# Patient Record
Sex: Female | Born: 1935
Health system: Southern US, Community
[De-identification: ages and names within clinical notes are randomized; demographics above are authoritative.]

## PROBLEM LIST (undated history)

## (undated) DIAGNOSIS — I4891 Unspecified atrial fibrillation: Secondary | ICD-10-CM

## (undated) DIAGNOSIS — I499 Cardiac arrhythmia, unspecified: Secondary | ICD-10-CM

## (undated) DIAGNOSIS — I10 Essential (primary) hypertension: Secondary | ICD-10-CM

## (undated) DIAGNOSIS — F419 Anxiety disorder, unspecified: Secondary | ICD-10-CM

## (undated) DIAGNOSIS — C50919 Malignant neoplasm of unspecified site of unspecified female breast: Secondary | ICD-10-CM

## (undated) HISTORY — DX: Cardiac arrhythmia, unspecified: I49.9

## (undated) HISTORY — DX: Unspecified atrial fibrillation: I48.91

## (undated) HISTORY — DX: Anxiety disorder, unspecified: F41.9

## (undated) HISTORY — DX: Malignant neoplasm of unspecified site of unspecified female breast: C50.919

## (undated) HISTORY — PX: PARTIAL HYSTERECTOMY: SHX80

## (undated) HISTORY — PX: LIVER SURGERY: SHX698

## (undated) HISTORY — DX: Essential (primary) hypertension: I10

## (undated) HISTORY — PX: HEMORRHOID SURGERY: SHX153

---

## 2010-06-26 DIAGNOSIS — H40023 Open angle with borderline findings, high risk, bilateral: Secondary | ICD-10-CM | POA: Insufficient documentation

## 2010-06-26 DIAGNOSIS — H2510 Age-related nuclear cataract, unspecified eye: Secondary | ICD-10-CM | POA: Insufficient documentation

## 2010-06-26 DIAGNOSIS — H539 Unspecified visual disturbance: Secondary | ICD-10-CM | POA: Insufficient documentation

## 2019-02-18 ENCOUNTER — Other Ambulatory Visit: Payer: Self-pay

## 2019-02-18 ENCOUNTER — Encounter: Payer: Self-pay | Admitting: Cardiovascular Disease

## 2019-02-18 ENCOUNTER — Ambulatory Visit (INDEPENDENT_AMBULATORY_CARE_PROVIDER_SITE_OTHER): Payer: Medicare Other | Admitting: Cardiovascular Disease

## 2019-02-18 VITALS — BP 136/91 | HR 79 | Ht 60.0 in | Wt 154.0 lb

## 2019-02-18 DIAGNOSIS — I10 Essential (primary) hypertension: Secondary | ICD-10-CM

## 2019-02-18 DIAGNOSIS — R600 Localized edema: Secondary | ICD-10-CM | POA: Diagnosis not present

## 2019-02-18 DIAGNOSIS — I4821 Permanent atrial fibrillation: Secondary | ICD-10-CM

## 2019-02-18 MED ORDER — TIADYLT ER 240 MG PO CP24: 240.0000 mg | ORAL_CAPSULE | Freq: Every day | ORAL | 3 refills | Status: DC

## 2019-02-18 MED ORDER — APIXABAN 5 MG PO TABS: 5.0000 mg | ORAL_TABLET | Freq: Two times a day (BID) | ORAL | 3 refills | Status: DC

## 2019-02-18 MED ORDER — LOSARTAN POTASSIUM-HCTZ 100-12.5 MG PO TABS: 1.0000 | ORAL_TABLET | Freq: Every day | ORAL | 3 refills | Status: DC

## 2019-02-18 NOTE — Patient Instructions (Signed)
Medication Instructions:  Continue same medications   Lab work: None ordered  Testing/Procedures: Schedule Echo  Follow-Up: At [facility], you and your health needs are our priority.  As part of our continuing mission to provide you with exceptional heart care, we have created designated Provider Care Teams.  These Care Teams include your primary Cardiologist (physician) and Advanced Practice Providers (APPs -  Physician Assistants and Nurse Practitioners) who all work together to provide you with the care you need, when you need it. . Schedule follow up appointment in 4 months

## 2019-02-18 NOTE — Progress Notes (Signed)
Vit  

## 2019-02-18 NOTE — Progress Notes (Signed)
Cardiology Office Note:   Date:  02/18/2019  NAME:  Whitney Burton    MRN: [id number] DOB:  1933-05-18   PCP:  Donald Prose, MD  Cardiologist:  No primary care provider on file.  Electrophysiologist:  None   Referring MD: Donald Prose, MD   Chief Complaint  Patient presents with   Atrial Fibrillation    History of Present Illness:   Whitney Burton is a 85 y.o. female with a hx of hypertension, atrial fibrillation who is being seen today for the evaluation of atrial fibrillation at the request of Donald Prose, MD.  She presents for the evaluation of atrial fibrillation.  She was diagnosed with this nearly 1-1/2 years ago and [facility].  She reports that she underwent a cardioversion that was unsuccessful about 1 year ago.  She states that she did not want a pursue another cardioversion.  She was offered appears to be Tikosyn but she did not want to do this because she did not want is in the hospital for 3 days.  She reports has been fatigued for nearly 1-1/2 years.  This has coincided with the death of her husband.  She is recently relocated to [facility] to be closer to her daughter who lives here.  She states she is had no issues with Eliquis.  Apparently her cardiologist there wanted to pursue a watchman, but she was not interested.  She has no troubles with falls or issues with bleeding that I can tell.  She reports she is unaware that she is in atrial fibrillation, and is doing the best she can.  She does report intermittent chest tightness.  Overall she is getting used to living in [place], and quite occupied with her recent move.  She does report significant stress with a neighbor who lives above her.  Apparently this never causes quite a bit of noise.  Other than that she denies any cardiovascular symptoms.  She reports occasional lower extremity edema swelling.  That improves with raising her legs at night.  Overall, she is in pretty good health.  EKG just demonstrates atrial  fibrillation.  Past Medical History: Past Medical History:  Diagnosis Date   Arrhythmia    Hypertension     Past Surgical History: Past Surgical History:  Procedure Laterality Date   HEMORRHOID SURGERY     PARTIAL HYSTERECTOMY      Current Medications: Current Meds  Medication Sig   apixaban (ELIQUIS) 5 MG TABS tablet Take 5 mg by mouth 2 (two) times daily.   losartan-hydrochlorothiazide (HYZAAR) 100-12.5 MG tablet Take 1 tablet by mouth daily.   TIADYLT ER 240 MG 24 hr capsule Take 240 mg by mouth daily.     Allergies:    Patient has no allergy information on record.   Social History: Social History   Socioeconomic History   Marital status: Widowed    Spouse name: Not on file   Number of children: Not on file   Years of education: Not on file   Highest education level: Not on file  Occupational History   Not on file  Social Needs   Financial resource strain: Not on file   Food insecurity    Worry: Not on file    Inability: Not on file   Transportation needs    Medical: Not on file    Non-medical: Not on file  Tobacco Use   Smoking status: Former Smoker   Smokeless tobacco: Never Used  Substance and Sexual Activity  Alcohol use: Yes   Drug use: Not on file   Sexual activity: Not on file  Lifestyle   Physical activity    Days per week: Not on file    Minutes per session: Not on file   Stress: Not on file  Relationships   Social connections    Talks on phone: Not on file    Gets together: Not on file    Attends religious service: Not on file    Active member of club or organization: Not on file    Attends meetings of clubs or organizations: Not on file    Relationship status: Not on file  Other Topics Concern   Not on file  Social History Narrative   Widowed. Moved to [place] to be closer to daughter.      Family History: The patient's family history includes Heart attack in her father.  ROS:   All other ROS  reviewed and negative. Pertinent positives noted in the HPI.     EKGs/Labs/Other Studies Reviewed:   The following studies were personally reviewed by me today:  EKG:  EKG is ordered today.  The ekg ordered today demonstrates atrial fibrillation, heart rate 79, normal intervals, PVC noted, poor R wave progression likely due to incorrect precordial placement, and was personally reviewed by me.   Recent Labs: No results found for requested labs within last 8760 hours.   Recent Lipid Panel No results found for: CHOL, TRIG, HDL, CHOLHDL, VLDL, LDLCALC, LDLDIRECT  Physical Exam:   VS:  BP (!) 136/91 (BP Location: Left Arm)    Pulse 79    Ht 5' (1.524 m)    Wt 154 lb (69.9 kg)    BMI 30.08 kg/m    Wt Readings from Last 3 Encounters:  02/18/19 154 lb (69.9 kg)    General: Well nourished, well developed, in no acute distress Heart: Atraumatic, normal size  Eyes: PEERLA, EOMI  Neck: Supple, no JVD Endocrine: No thryomegaly Cardiac: Normal S1, S2; irregular rhythm; no murmurs, rubs, or gallops Lungs: Clear to auscultation bilaterally, no wheezing, rhonchi or rales  Abd: Soft, nontender, no hepatomegaly  Ext: Trace edema, evidence of chronic venous insufficiency Musculoskeletal: No deformities, BUE and BLE strength normal and equal Skin: Warm and dry, no rashes   Neuro: Alert and oriented to person, place, time, and situation, CNII-XII grossly intact, no focal deficits  Psych: Normal mood and affect   ASSESSMENT:   NAME@ is a 85 y.o. female who presents for the following: 1. Permanent atrial fibrillation   2. Essential hypertension   3. Leg edema     PLAN:   1. Permanent atrial fibrillation -She is had unsuccessful cardioversion x1 in the past.  She was not interested in medication or rhythm control therapy at that time.  I think we can get an echocardiogram here.  She reports she would like to possibly discuss rhythm control options for her but would like to get settled into  [facility] for a pursue that. -For now we will discontinue her diltiazem 240 mg extended release daily.  I instructed her to stop taking metoprolol, I do not think she needs a beta-blocker and a calcium channel blocker the same time. -No issues with bleeding although we refilled her diltiazem and Eliquis 5 mg twice daily  2. Essential hypertension -A bit up today but reports difficulties in getting to her appointment, we will not make any changes other than the diltiazem as above  3. Leg edema -Likely just related  to the venous insufficiency, we will check an echocardiogram however she will pursue conservative measures as elevating her legs at night   Disposition: Return in about 4 months (around 06/20/2019).  Medication Adjustments/Labs and Tests Ordered: Current medicines are reviewed at length with the patient today.  Concerns regarding medicines are outlined above.  Orders Placed This Encounter  Procedures   EKG 12-Lead   ECHOCARDIOGRAM COMPLETE   No orders of the defined types were placed in this encounter.   Patient Instructions  Medication Instructions:  Continue same medications   Lab work: None ordered  Testing/Procedures: Schedule Echo  Follow-Up: At [facility], you and your health needs are our priority.  As part of our continuing mission to provide you with exceptional heart care, we have created designated Provider Care Teams.  These Care Teams include your primary Cardiologist (physician) and Advanced Practice Providers (APPs -  Physician Assistants and Nurse Practitioners) who all work together to provide you with the care you need, when you need it.  Schedule follow up appointment in 4 months      Signed, [place] T. Audie Box, [place]  [address], [place], [place] [address] [phone]  02/18/2019 11:34 AM

## 2019-02-28 ENCOUNTER — Other Ambulatory Visit: Payer: Self-pay

## 2019-02-28 ENCOUNTER — Ambulatory Visit (HOSPITAL_COMMUNITY): Payer: Medicare Other | Attending: Cardiology

## 2019-02-28 DIAGNOSIS — I10 Essential (primary) hypertension: Secondary | ICD-10-CM

## 2019-02-28 DIAGNOSIS — I4821 Permanent atrial fibrillation: Secondary | ICD-10-CM

## 2019-03-04 ENCOUNTER — Telehealth: Payer: Self-pay | Admitting: Cardiovascular Disease

## 2019-03-04 MED ORDER — METOPROLOL TARTRATE 25 MG PO TABS: 25.0000 mg | ORAL_TABLET | Freq: Two times a day (BID) | ORAL | 3 refills | Status: DC

## 2019-03-04 NOTE — Telephone Encounter (Signed)
Spoke with patient of Dr. Audie Box. She reports she was not feeling well the past 2 days. She reports she was taking off Bystolic by Dr. Audie Box - MD note states metoprolol and diltiazem were stopped on 9/4 - "For now we will discontinue her diltiazem 240 mg extended release daily.  I instructed her to stop taking metoprolol, I do not think she needs a beta-blocker and a calcium channel blocker the same time."   She has old bystolic 20mg  QD and metoprolol tartrate 25mg  BID (former MD recommended change to this beta-blocker d/t cost of bystolic) prescriptions at home.   She is currently taking brand-name diltiazem & losartan-hctz.   She only checked her BP today - 160/106 and HR 100 - readings taken after medications. No other home readings provided  She saw PCP on 91/15 and BP was 122/78  She reports no energy, denies chest pain/shortness of breath, no headaches/blurry vision  Asked that she check BP again and I will call her back this afternoon  Will forward to MD to review

## 2019-03-04 NOTE — Addendum Note (Signed)
Addended by: Fidel Levy on: 03/04/2019 01:57 PM   Modules accepted: Orders

## 2019-03-04 NOTE — Telephone Encounter (Signed)
-----   Message from Geralynn Rile, MD sent at 03/04/2019 12:09 PM EDT -----  Please instruct her to restart her metoprolol. She should be on this. No diltiazem. No bystolic. -W

## 2019-03-04 NOTE — Telephone Encounter (Signed)
Patient aware of MD recommendations. She voiced understanding. Metoprolol tartrate refill sent to pharmacy per request. She is aware on-call provider is available for acute issues after hours/weekends  Her BP was 158/101 and HR 95

## 2019-03-04 NOTE — Telephone Encounter (Signed)
Pt c/o BP issue: STAT if pt c/o blurred vision, one-sided weakness or slurred speech  1. What are your last 5 BP readings?  160/106  2. Are you having any other symptoms (ex. Dizziness, headache, blurred vision, passed out)?  Pulse rate is 100  3. What is your BP issue?  High blood pressure

## 2019-03-08 ENCOUNTER — Other Ambulatory Visit (INDEPENDENT_AMBULATORY_CARE_PROVIDER_SITE_OTHER): Payer: Medicare Other | Admitting: *Deleted

## 2019-03-08 ENCOUNTER — Telehealth: Payer: Self-pay | Admitting: Cardiovascular Disease

## 2019-03-08 ENCOUNTER — Other Ambulatory Visit: Payer: Self-pay

## 2019-03-08 DIAGNOSIS — I10 Essential (primary) hypertension: Secondary | ICD-10-CM

## 2019-03-08 NOTE — Telephone Encounter (Signed)
New message   Pt c/o BP issue: STAT if pt c/o blurred vision, one-sided weakness or slurred speech  1. What are your last 5 BP readings? 158/101 95 hr 133/110 106 hr 160/103 109 hr   2. Are you having any other symptoms (ex. Dizziness, headache, blurred vision, passed out)? Patient states that she is in afib  3. What is your BP issue?Patient's b/p is elevated

## 2019-03-08 NOTE — Patient Instructions (Signed)
Medication Instructions:  STOP METOPROLOL   START BYSTOLIC 20 MG TWICE A DAY   If you need a refill on your cardiac medications before your next appointment, please call your pharmacy.   Lab work: NONE  Testing/Procedures: NONE   Follow-Up: 04/06/19 AT 11:00 AM

## 2019-03-08 NOTE — Telephone Encounter (Signed)
Spoke with patient and she did take her morning medications since blood pressure checked this am. Her blood pressure now 148/107 HR 116. She does not take her blood pressure daily.  Patient was feeling jittery this am, denies any other symptoms including neurologic issues.  Per patient her blood pressure machine is old and has not had checked anywhere to make sure it is accurate.  Discussed with Dr Claiborne Billings DOD and will have patient increase her Metoprolol to 50 mg twice a day and see Dr Audie Box when he is back in office. Advised patient and she wanted to be seen sooner. I offered to bring her in for nurse visit to check her machine and she felt she needed to be seen by a doctor since she felt so bad. No specific symptoms "just feeling bad" I offered to try to arrange PA/NP visit and she decided she would just try the increased dose and call back in a couple of days with update. Will forward to RN and Dr Audie Box for review

## 2019-03-08 NOTE — Progress Notes (Unsigned)
  1.) Reason for visit: Blood pressure check   2.) Name of MD requesting visit:  W O'Neal   3.) Assessment and plan per MD: Patient in today for blood pressure check secondary to elevated blood pressure at home. Patient was seen by Dr Audie Box to establish care 02/18/19. Patient had previously been on Bystolic with good blood pressure control. That had been changed to Metoprolol by previous cardiologist secondary to cost. Patients Metoprolol had been increased to 50 mg twice a day this morning. Patient in for blood pressure visit. Blood pressure 156/88.  Raquel Pharm D discussed with patient she feels like she can afford Bystolic just didn't want to pay.    Patient is comfortable going back on Bystolic and 1 month samples given to patient. Discussed with DOD Dr Claiborne Billings who agrees with plan. Follow up with Dr Audie Box scheduled.

## 2019-03-08 NOTE — Telephone Encounter (Signed)
Called Whitney Burton. She has had spikes in BP. Was instructed to take metoprolol tartrate 50 mg BID and she will continue this. She is in the [place] office and will get a BP check by nursing staff. I will get her in to see me a bit sooner.   Evalina Field, MD

## 2019-03-10 NOTE — Telephone Encounter (Signed)
Spoke with patient - appointment moved to oct 7 ,2020 at 1:20 pm ( reschedule 04/06/19)   patient aware to bring medications and new b/p machine to test against office machine . patient aware to be here at 1 pm and wear face covering.

## 2019-03-11 ENCOUNTER — Telehealth: Payer: Self-pay | Admitting: Pharmacist

## 2019-03-11 NOTE — Telephone Encounter (Signed)
Patient is  having "some problems" and wants to talk to the pharmacist.   We clarified medication list during call.  Patient was instructed to check BP only in AM and HS. Bring records to f/u visit on 03-23-2019.

## 2019-03-16 ENCOUNTER — Telehealth: Payer: Self-pay

## 2019-03-16 MED ORDER — DILTIAZEM HCL ER BEADS 240 MG PO CP24: 240.0000 mg | ORAL_CAPSULE | Freq: Every day | ORAL | 1 refills | Status: DC

## 2019-03-16 NOTE — Telephone Encounter (Signed)
Per DR Audie Box - okay to resume Diltiazem 240mg  daily and keep appointment for October 5.

## 2019-03-16 NOTE — Telephone Encounter (Signed)
Patient has follow up office visit with Dr Audie Box in 7 days but is calling back today to report elevated BP and axiety feeling  Patient denies dizziness, chest pain, swelling, or shortness of breath.   PCP gave her  sertraline 50mg  daily in AM for anxiety and she had less then 3 doses so far.  BP report: 152/118  83 pulse 158/107  106 pulse   Will contact DR O'Neal to assess oif okay to resume diltiazem ER today, with follow up in 1 week at the office.

## 2019-03-17 ENCOUNTER — Telehealth: Payer: Self-pay | Admitting: Cardiovascular Disease

## 2019-03-17 NOTE — Telephone Encounter (Signed)
Pt updated and voiced understanding.  

## 2019-03-17 NOTE — Telephone Encounter (Signed)
New Message:    Pt is scheduled to see Dr Marisue Ivan on Monday. Her daughter is coming from [place], she would like for her to come in with her for her appointment please.

## 2019-03-17 NOTE — Telephone Encounter (Signed)
That will be fine. -W

## 2019-03-20 NOTE — Progress Notes (Signed)
Cardiology Office Note:   Date:  03/21/2019  NAME:  Whitney Burton    MRN: [id number] DOB:  1932/10/02   PCP:  Donald Prose, MD  Cardiologist:  Evalina Field, MD   Referring MD: Donald Prose, MD   Chief Complaint  Patient presents with  . Hypertension    History of Present Illness:   Whitney Burton is a 85 y.o. female with a hx of hypertension, atrial fibrillation who presents for follow-up of hypertension and afib.  She reports recent issues with elevated blood pressure since stopping her Bystolic.  Her Bystolic is been restarted.  She is remained on Bystolic as well as her diltiazem.  Her heart rates have been well controlled, and review of her blood pressure log shows blood pressure in the [id number] range systolic as well as [id number] range diastolic.  She reports symptoms of anxiety, as well as concerns about her blood pressure.  She also reports that her primary care physician has started her on Zoloft.  She states she is okay to take this medication.  Her blood pressure is a bit elevated today, but she reports she is a bit nervous.  She presents with her daughter from [place].  She denies any chest pain or trouble breathing.  She is quite frail, and working with physical therapy at her assisted living.  She does report difficulties given her recent adjustment of moving to [place] as well as the death of her husband, as well as getting used to new physicians.  It is good to know that she does like her cardiologist.  Past Medical History: Past Medical History:  Diagnosis Date  . Arrhythmia   . Hypertension     Past Surgical History: Past Surgical History:  Procedure Laterality Date  . HEMORRHOID SURGERY    . PARTIAL HYSTERECTOMY      Current Medications: Current Meds  Medication Sig  . apixaban (ELIQUIS) 5 MG TABS tablet Take 1 tablet (5 mg total) by mouth 2 (two) times daily.  . Cholecalciferol (VITAMIN D3) 25 MCG (1000 UT) CAPS Take by mouth. Take 1000 IU daily  . diltiazem  (TIADYLT ER) 240 MG 24 hr capsule Take 1 capsule (240 mg total) by mouth daily.  Marland Kitchen losartan-hydrochlorothiazide (HYZAAR) 100-12.5 MG tablet Take 1 tablet by mouth daily.  . Nebivolol HCl (BYSTOLIC) 20 MG TABS Take 20 mg by mouth 2 (two) times daily.     Allergies:    Patient has no allergy information on record.   Social History: Social History   Socioeconomic History  . Marital status: Widowed    Spouse name: Not on file  . Number of children: Not on file  . Years of education: Not on file  . Highest education level: Not on file  Occupational History  . Not on file  Social Needs  . Financial resource strain: Not on file  . Food insecurity    Worry: Not on file    Inability: Not on file  . Transportation needs    Medical: Not on file    Non-medical: Not on file  Tobacco Use  . Smoking status: Former Research scientist (life sciences)  . Smokeless tobacco: Never Used  Substance and Sexual Activity  . Alcohol use: Yes  . Drug use: Not on file  . Sexual activity: Not on file  Lifestyle  . Physical activity    Days per week: Not on file    Minutes per session: Not on file  . Stress: Not on file  Relationships  .  Social Herbalist on phone: Not on file    Gets together: Not on file    Attends religious service: Not on file    Active member of club or organization: Not on file    Attends meetings of clubs or organizations: Not on file    Relationship status: Not on file  Other Topics Concern  . Not on file  Social History Narrative   Widowed. Moved to [place] to be closer to daughter.      Family History: The patient's family history includes Heart attack in her father.  ROS:   All other ROS reviewed and negative. Pertinent positives noted in the HPI.     EKGs/Labs/Other Studies Reviewed:   The following studies were personally reviewed by me today:  Recent Labs: No results found for requested labs within last 8760 hours.   Recent Lipid Panel No results found for: CHOL,  TRIG, HDL, CHOLHDL, VLDL, LDLCALC, LDLDIRECT  Physical Exam:   VS:  BP (!) 154/89   Pulse 86   Ht 5' (1.524 m)   Wt 155 lb 9.6 oz (70.6 kg)   SpO2 97%   BMI 30.39 kg/m    Wt Readings from Last 3 Encounters:  03/21/19 155 lb 9.6 oz (70.6 kg)  02/18/19 154 lb (69.9 kg)    General: Well nourished, well developed, in no acute distress Heart: Atraumatic, normal size  Eyes: PEERLA, EOMI  Neck: Supple, no JVD Endocrine: No thryomegaly Cardiac: Irregular rhythm no murmurs rubs or gallops Lungs: Clear to auscultation bilaterally, no wheezing, rhonchi or rales  Abd: Soft, nontender, no hepatomegaly  Ext: Trace edema Musculoskeletal: No deformities, BUE and BLE strength normal and equal Skin: Lower extremities with venous insufficiency changes Neuro: Alert and oriented to person, place, time, and situation, CNII-XII grossly intact, no focal deficits  Psych: Normal mood and affect   ASSESSMENT:   NAME@ is a 85 y.o. female who presents for the following: 1. Permanent atrial fibrillation ([place])   2. Essential hypertension     PLAN:   1. Permanent atrial fibrillation ([facility]) -Rate control with diltiazem, will continue this -No issues with Eliquis -I informed her and her daughter that her anxiety is not related to her atrial fibrillation her echocardiogram was unremarkable  2. Essential hypertension -Blood pressure a bit up today, but review of her log shows she is normally within range of a blood pressure less than 140/90 -I informed her that it is okay to check her blood pressure once per day 1 hour after she takes her medicines in the morning but I do not want her to do well on this and to create more anxiety -I do suspect most of her symptoms are related to adjustment with moving to [place] and establishing with new physicians -She is also lost her husband recently and there probably is some bereavement/depression here  Disposition: Return in about 3 months (around 06/21/2019).   Medication Adjustments/Labs and Tests Ordered: Current medicines are reviewed at length with the patient today.  Concerns regarding medicines are outlined above.  No orders of the defined types were placed in this encounter.  No orders of the defined types were placed in this encounter.   Patient Instructions  Medication Instructions:   NO CHANGES WITH CURRENT MEDICATIONS  If you need a refill on your cardiac medications before your next appointment, please call your pharmacy.   Lab work:  NOT NEEDED  Testing/Procedures: NOT NEEDED  Follow-Up: At [facility], you and  your health needs are our priority.  As part of our continuing mission to provide you with exceptional heart care, we have created designated Provider Care Teams.  These Care Teams include your primary Cardiologist (physician) and Advanced Practice Providers (APPs -  Physician Assistants and Nurse Practitioners) who all work together to provide you with the care you need, when you need it. . Dr Audie Box recommends that you schedule a follow-up appointment in [place] 2021     Any Other Special Instructions Will Be Listed Below (If Applicable).    Signed, Addison Naegeli. Audie Box, [place]  [address], [place], [place] [address] [phone]  03/21/2019 12:42 PM

## 2019-03-21 ENCOUNTER — Encounter: Payer: Self-pay | Admitting: Cardiovascular Disease

## 2019-03-21 ENCOUNTER — Ambulatory Visit: Payer: Medicare Other | Admitting: Cardiovascular Disease

## 2019-03-21 ENCOUNTER — Other Ambulatory Visit: Payer: Self-pay

## 2019-03-21 VITALS — BP 154/89 | HR 86 | Ht 60.0 in | Wt 155.6 lb

## 2019-03-21 DIAGNOSIS — I4821 Permanent atrial fibrillation: Secondary | ICD-10-CM

## 2019-03-21 DIAGNOSIS — I10 Essential (primary) hypertension: Secondary | ICD-10-CM | POA: Diagnosis not present

## 2019-03-21 NOTE — Patient Instructions (Addendum)
Medication Instructions:   NO CHANGES WITH CURRENT MEDICATIONS  If you need a refill on your cardiac medications before your next appointment, please call your pharmacy.   Lab work:  NOT NEEDED  Testing/Procedures: NOT NEEDED  Follow-Up: At [facility], you and your health needs are our priority.  As part of our continuing mission to provide you with exceptional heart care, we have created designated Provider Care Teams.  These Care Teams include your primary Cardiologist (physician) and Advanced Practice Providers (APPs -  Physician Assistants and Nurse Practitioners) who all work together to provide you with the care you need, when you need it. . Dr Audie Box recommends that you schedule a follow-up appointment in [place] 2021     Any Other Special Instructions Will Be Listed Below (If Applicable).

## 2019-03-23 ENCOUNTER — Ambulatory Visit: Payer: Medicare Other | Admitting: Cardiovascular Disease

## 2019-04-06 ENCOUNTER — Ambulatory Visit: Payer: Medicare Other | Admitting: Cardiovascular Disease

## 2019-05-11 ENCOUNTER — Telehealth: Payer: Self-pay | Admitting: Pharmacist

## 2019-05-11 NOTE — Telephone Encounter (Signed)
Medication Samples have been provided to the patient.  Drug name: Bystolic      Strength: 20mg       Qty: 56 tabs LOT: [id number]  Exp.Date: 07/2020     Drug name: Eliqius       Strength: 5mg         Qty: 48 tabs LOT: [id number] Exp.Date: Jan/2023

## 2019-06-14 ENCOUNTER — Other Ambulatory Visit: Payer: Self-pay | Admitting: Pharmacist Clinician (PhC)/ Clinical Pharmacy Specialist

## 2019-06-14 MED ORDER — BYSTOLIC 20 MG PO TABS: 20.0000 mg | ORAL_TABLET | Freq: Two times a day (BID) | ORAL | 0 refills | Status: DC

## 2019-06-20 NOTE — Progress Notes (Signed)
Cardiology Office Note:   Date:  06/21/2019  NAME:  Whitney Burton    MRN: [id number] DOB:  1932/11/10   PCP:  Donald Prose, MD  Cardiologist:  Evalina Field, MD  Electrophysiologist:  None   Referring MD: Donald Prose, MD   Chief Complaint  Patient presents with  . Hypertension   History of Present Illness:   Whitney Burton is a 86 y.o. female with a hx of permanent atrial fibrillation, hypertension who presents for follow-up of atrial fibrillation.  Blood pressure slightly up today.  She has no symptoms of headache, shortness of breath or lightheadedness.  She reports she still getting adjusted to the recent move from [facility].  She lives in independent living to be closer to her daughter.  She reports she still having issues with neighbors above her.  She also reports that living in her facility is difficult during the coronavirus pandemic.  She is still able to go to the grocery store and walk around without any episodes of chest pain or shortness of breath.  She does report she just feels fatigued at times and tired.  She is still coping with the loss of her husband who died nearly 1 year ago.  She also sees her family about once a week.  She also reports that her granddaughter was recently diagnosed with type 1 diabetes.  This has been stressful on her as well.  She reports no chest pain, shortness of breath, no lower extremity edema today.  Problem List 1. Permanent Atrial Fibrillation  -diagnosed 2019 [facility] -failed DCCV and did not want to do rhythm medications  2. Hypertension   Past Medical History: Past Medical History:  Diagnosis Date  . Arrhythmia   . Hypertension     Past Surgical History: Past Surgical History:  Procedure Laterality Date  . HEMORRHOID SURGERY    . PARTIAL HYSTERECTOMY      Current Medications: Current Meds  Medication Sig  . apixaban (ELIQUIS) 5 MG TABS tablet Take 1 tablet (5 mg total) by mouth 2 (two) times daily.  .  Cholecalciferol (VITAMIN D3) 25 MCG (1000 UT) CAPS Take by mouth. Take 1000 IU daily  . diltiazem (TIADYLT ER) 240 MG 24 hr capsule Take 1 capsule (240 mg total) by mouth daily.  Marland Kitchen losartan-hydrochlorothiazide (HYZAAR) 100-12.5 MG tablet Take 1 tablet by mouth daily.  . Nebivolol HCl (BYSTOLIC) 20 MG TABS Take 1 tablet (20 mg total) by mouth 2 (two) times daily.  . [DISCONTINUED] diltiazem (TIADYLT ER) 240 MG 24 hr capsule Take 1 capsule (240 mg total) by mouth daily.  . [DISCONTINUED] losartan-hydrochlorothiazide (HYZAAR) 100-12.5 MG tablet Take 1 tablet by mouth daily.  . [DISCONTINUED] Nebivolol HCl (BYSTOLIC) 20 MG TABS Take 1 tablet (20 mg total) by mouth 2 (two) times daily.     Allergies:    Patient has no allergy information on record.   Social History: Social History   Socioeconomic History  . Marital status: Widowed    Spouse name: Not on file  . Number of children: Not on file  . Years of education: Not on file  . Highest education level: Not on file  Occupational History  . Not on file  Tobacco Use  . Smoking status: Former Research scientist (life sciences)  . Smokeless tobacco: Never Used  Substance and Sexual Activity  . Alcohol use: Yes  . Drug use: Not on file  . Sexual activity: Not on file  Other Topics Concern  . Not  on file  Social History Narrative   Widowed. Moved to [place] to be closer to daughter.    Social Determinants of Health   Financial Resource Strain:   . Difficulty of Paying Living Expenses: Not on file  Food Insecurity:   . Worried About Charity fundraiser in the Last Year: Not on file  . Ran Out of Food in the Last Year: Not on file  Transportation Needs:   . Lack of Transportation (Medical): Not on file  . Lack of Transportation (Non-Medical): Not on file  Physical Activity:   . Days of Exercise per Week: Not on file  . Minutes of Exercise per Session: Not on file  Stress:   . Feeling of Stress : Not on file  Social Connections:   . Frequency of  Communication with Friends and Family: Not on file  . Frequency of Social Gatherings with Friends and Family: Not on file  . Attends Religious Services: Not on file  . Active Member of Clubs or Organizations: Not on file  . Attends Archivist Meetings: Not on file  . Marital Status: Not on file     Family History: The patient's family history includes Heart attack in her father.  ROS:   All other ROS reviewed and negative. Pertinent positives noted in the HPI.     EKGs/Labs/Other Studies Reviewed:   The following studies were personally reviewed by me today:  TTE 02/28/2019  1. The left ventricle has normal systolic function with an ejection fraction of 60-65%. The cavity size was normal. Left ventricular diastolic function could not be evaluated secondary to atrial fibrillation. No evidence of left ventricular regional  wall motion abnormalities.  2. Left atrial size was moderately dilated.  3. Right atrial size was mildly dilated.  4. Small pericardial effusion.  5. The pericardial effusion is circumferential.  6. Tricuspid valve regurgitation is mild-moderate.  7. The aortic valve is tricuspid. No stenosis of the aortic valve.  8. The aorta is normal unless otherwise noted.  9. The aortic root, ascending aorta and aortic arch are normal in size and structure. 10. The interatrial septum was not well visualized.  Recent Labs: No results found for requested labs within last 8760 hours.   Recent Lipid Panel No results found for: CHOL, TRIG, HDL, CHOLHDL, VLDL, LDLCALC, LDLDIRECT  Physical Exam:   VS:  BP (!) 156/84   Pulse 85   Ht 5' (1.524 m)   Wt 156 lb 9.6 oz (71 kg)   SpO2 97%   BMI 30.58 kg/m    Wt Readings from Last 3 Encounters:  06/21/19 156 lb 9.6 oz (71 kg)  03/21/19 155 lb 9.6 oz (70.6 kg)  02/18/19 154 lb (69.9 kg)    General: Well nourished, well developed, in no acute distress Heart: Atraumatic, normal size  Eyes: PEERLA, EOMI  Neck: Supple,  no JVD Endocrine: No thryomegaly Cardiac: Irregular rhythm, 2/6 holosystolic murmur Lungs: Clear to auscultation bilaterally, no wheezing, rhonchi or rales  Abd: Soft, nontender, no hepatomegaly  Ext: No edema, pulses 2+ Musculoskeletal: No deformities, BUE and BLE strength normal and equal Skin: Warm and dry, no rashes   Neuro: Alert and oriented to person, place, time, and situation, CNII-XII grossly intact, no focal deficits  Psych: Normal mood and affect   ASSESSMENT:   Whitney Burton is a 86 y.o. female who presents for the following: 1. Permanent atrial fibrillation ([place])   2. Essential hypertension     PLAN:  1. Permanent atrial fibrillation ([place]) -Doing well with rate control.  Will not be aggressive given prior history of failed cardioversion.  She also does not want this. -Continue her diltiazem 240 extended release daily as well as Bystolic 20 mg twice daily for rate control -No issues with Eliquis 5 mg twice daily -Medications refilled today  2. Essential hypertension -A bit up today, but given age we will continue current medications -Medications refilled today  Disposition: Return in about 6 months (around 12/19/2019).  Medication Adjustments/Labs and Tests Ordered: Current medicines are reviewed at length with the patient today.  Concerns regarding medicines are outlined above.  No orders of the defined types were placed in this encounter.  Meds ordered this encounter  Medications  . diltiazem (TIADYLT ER) 240 MG 24 hr capsule    Sig: Take 1 capsule (240 mg total) by mouth daily.    Dispense:  90 capsule    Refill:  3  . losartan-hydrochlorothiazide (HYZAAR) 100-12.5 MG tablet    Sig: Take 1 tablet by mouth daily.    Dispense:  90 tablet    Refill:  3  . Nebivolol HCl (BYSTOLIC) 20 MG TABS    Sig: Take 1 tablet (20 mg total) by mouth 2 (two) times daily.    Dispense:  180 tablet    Refill:  3    Patient Instructions  Medication Instructions:  Your  physician recommends that you continue on your current medications as directed. Please refer to the Current Medication list given to you today.  *If you need a refill on your cardiac medications before your next appointment, please call your pharmacy*  Lab Work: NONE If you have labs (blood work) drawn today and your tests are completely normal, you will receive your results only by: Marland Kitchen MyChart Message (if you have MyChart) OR . A paper copy in the mail If you have any lab test that is abnormal or we need to change your treatment, we will call you to review the results.  Testing/Procedures: NONE  Follow-Up: At [facility], you and your health needs are our priority.  As part of our continuing mission to provide you with exceptional heart care, we have created designated Provider Care Teams.  These Care Teams include your primary Cardiologist (physician) and Advanced Practice Providers (APPs -  Physician Assistants and Nurse Practitioners) who all work together to provide you with the care you need, when you need it.  Your next appointment:   6 month(s)  The format for your next appointment:   In Person  Provider:   Eleonore Chiquito, MD     Signed, Addison Naegeli. Audie Box, [place]  [address], [place], [place] [address] [phone]  06/21/2019 1:00 PM

## 2019-06-21 ENCOUNTER — Other Ambulatory Visit: Payer: Self-pay

## 2019-06-21 ENCOUNTER — Ambulatory Visit: Payer: Medicare Other | Admitting: Cardiovascular Disease

## 2019-06-21 ENCOUNTER — Encounter: Payer: Self-pay | Admitting: Cardiovascular Disease

## 2019-06-21 VITALS — BP 156/84 | HR 85 | Ht 60.0 in | Wt 156.6 lb

## 2019-06-21 DIAGNOSIS — I4821 Permanent atrial fibrillation: Secondary | ICD-10-CM

## 2019-06-21 DIAGNOSIS — I10 Essential (primary) hypertension: Secondary | ICD-10-CM | POA: Diagnosis not present

## 2019-06-21 MED ORDER — LOSARTAN POTASSIUM-HCTZ 100-12.5 MG PO TABS: 1.0000 | ORAL_TABLET | Freq: Every day | ORAL | 3 refills | Status: DC

## 2019-06-21 MED ORDER — APIXABAN 5 MG PO TABS: 5.0000 mg | ORAL_TABLET | Freq: Two times a day (BID) | ORAL | 1 refills | Status: DC

## 2019-06-21 MED ORDER — BYSTOLIC 20 MG PO TABS: 20.0000 mg | ORAL_TABLET | Freq: Two times a day (BID) | ORAL | 3 refills | Status: DC

## 2019-06-21 MED ORDER — DILTIAZEM HCL ER BEADS 240 MG PO CP24: 240.0000 mg | ORAL_CAPSULE | Freq: Every day | ORAL | 3 refills | Status: DC

## 2019-06-21 NOTE — Telephone Encounter (Signed)
Age 86, weight 71kg ,SCr 0.63 at [facility] in Sept 2020, afib indication, last OV today

## 2019-06-21 NOTE — Patient Instructions (Signed)
Medication Instructions:  Your physician recommends that you continue on your current medications as directed. Please refer to the Current Medication list given to you today.  *If you need a refill on your cardiac medications before your next appointment, please call your pharmacy*  Lab Work: NONE If you have labs (blood work) drawn today and your tests are completely normal, you will receive your results only by: Marland Kitchen MyChart Message (if you have MyChart) OR . A paper copy in the mail If you have any lab test that is abnormal or we need to change your treatment, we will call you to review the results.  Testing/Procedures: NONE  Follow-Up: At [facility], you and your health needs are our priority.  As part of our continuing mission to provide you with exceptional heart care, we have created designated Provider Care Teams.  These Care Teams include your primary Cardiologist (physician) and Advanced Practice Providers (APPs -  Physician Assistants and Nurse Practitioners) who all work together to provide you with the care you need, when you need it.  Your next appointment:   6 month(s)  The format for your next appointment:   In Person  Provider:   Eleonore Chiquito, MD

## 2019-11-04 ENCOUNTER — Telehealth: Payer: Self-pay | Admitting: Pharmacist

## 2019-11-04 NOTE — Telephone Encounter (Signed)
Patient left message at [facility] phone to report new symptom of "swollen legs" and to request advice form [place].   She is already scheduled to see Dr. Audie Box on 9/26 at 9:40am for same reason.   Swelling started 2 weeks ago, but not getting any better. Swelling goes down after sleeping. No pain, increased SOB or increased fatigue. She does NOT check her weight at home.    * Also interested on therapeutic alternative for Bystolic (too expensive*  Recommendation:  Patient instructed to continue all therapy as written ( No s/sx of acute fluid retention other than LEE) and follow up with Dr Layla Barter in 5 days as previously scheduled.

## 2019-11-06 NOTE — Progress Notes (Signed)
Cardiology Office Note:   Date:  11/09/2019  NAME:  Whitney Burton    MRN: [id number] DOB:  March 26, 1933   PCP:  Donald Prose, MD  Cardiologist:  Evalina Field, MD   Referring MD: Donald Prose, MD   Chief Complaint  Patient presents with  . Leg Swelling   History of Present Illness:   Whitney Burton is a 86 y.o. female with a hx of HTN, Afib who presents for follow-up. Has had increased LE edema and presents for evaluation.  She reports over the last 2 weeks has had increasing lower extremity edema.  She apparently has swelling in her legs that is worse at the end of the day.  She reports in the morning it is better.  Apparently she has cut back on salty foods and this is helped.  Apparently at her assisted living facility they do make the meals and the salt content is not negotiable.  Review of weights show her weight is stable at 156 pounds.  She was this weight several months ago.  She also reports that she is a bit more short of breath.  She does have to walk up and down the halls in her facility and this does tire her out a little more easily.  No symptoms of orthopnea or PND.  She also reports that activities such as removing items from the dishwasher gets her a bit short of breath.  Apparently this is also worsened over the last few months to weeks.  She reports that medications have not changed recently.  Her blood pressure is 140/80 today.  There were issues getting her blood pressure but I was able to get it.  She denies any chest pain or shortness of breath today.  She reports her heart rate has not been an issue.  She describes no dizziness or lightheadedness.  She does have evidence of venous insufficiency on exam.  She does inquire about a diuretic.  She also inquires about her Eliquis nebivolol.  Apparently medications are quite expensive.  She is able to afford the Eliquis and both medications but just wonders about alternative agents.  Problem List 1. Permanent Atrial Fibrillation    -diagnosed 2019 [facility] -failed DCCV and did not want to do rhythm medications  2. Hypertension   Past Medical History: Past Medical History:  Diagnosis Date  . Arrhythmia   . Hypertension     Past Surgical History: Past Surgical History:  Procedure Laterality Date  . HEMORRHOID SURGERY    . PARTIAL HYSTERECTOMY      Current Medications: Current Meds  Medication Sig  . apixaban (ELIQUIS) 5 MG TABS tablet Take 1 tablet (5 mg total) by mouth 2 (two) times daily.  . Cholecalciferol (VITAMIN D3) 25 MCG (1000 UT) CAPS Take by mouth. Take 1000 IU daily  . diltiazem (TIADYLT ER) 240 MG 24 hr capsule Take 1 capsule (240 mg total) by mouth daily.  . [DISCONTINUED] losartan-hydrochlorothiazide (HYZAAR) 100-12.5 MG tablet Take 1 tablet by mouth daily.  . [DISCONTINUED] Nebivolol HCl (BYSTOLIC) 20 MG TABS Take 1 tablet (20 mg total) by mouth 2 (two) times daily.  . [DISCONTINUED] valsartan-hydrochlorothiazide (DIOVAN-HCT) 160-12.5 MG tablet Take by mouth.     Allergies:    Patient has no allergy information on record.   Social History: Social History   Socioeconomic History  . Marital status: Widowed    Spouse name: Not on file  . Number of children: Not on file  . Years of education:  Not on file  . Highest education level: Not on file  Occupational History  . Not on file  Tobacco Use  . Smoking status: Former Research scientist (life sciences)  . Smokeless tobacco: Never Used  Substance and Sexual Activity  . Alcohol use: Yes  . Drug use: Not on file  . Sexual activity: Not on file  Other Topics Concern  . Not on file  Social History Narrative   Widowed. Moved to [place] to be closer to daughter.    Social Determinants of Health   Financial Resource Strain:   . Difficulty of Paying Living Expenses:   Food Insecurity:   . Worried About Charity fundraiser in the Last Year:   . Arboriculturist in the Last Year:   Transportation Needs:   . Film/video editor (Medical):   Marland Kitchen Lack  of Transportation (Non-Medical):   Physical Activity:   . Days of Exercise per Week:   . Minutes of Exercise per Session:   Stress:   . Feeling of Stress :   Social Connections:   . Frequency of Communication with Friends and Family:   . Frequency of Social Gatherings with Friends and Family:   . Attends Religious Services:   . Active Member of Clubs or Organizations:   . Attends Archivist Meetings:   Marland Kitchen Marital Status:      Family History: The patient's family history includes Heart attack in her father.  ROS:   All other ROS reviewed and negative. Pertinent positives noted in the HPI.     EKGs/Labs/Other Studies Reviewed:   The following studies were personally reviewed by me today:  TTE 02/28/2019 1. The left ventricle has normal systolic function with an ejection  fraction of 60-65%. The cavity size was normal. Left ventricular diastolic  function could not be evaluated secondary to atrial fibrillation. No  evidence of left ventricular regional  wall motion abnormalities.  2. Left atrial size was moderately dilated.  3. Right atrial size was mildly dilated.  4. Small pericardial effusion.  5. The pericardial effusion is circumferential.  6. Tricuspid valve regurgitation is mild-moderate.  7. The aortic valve is tricuspid. No stenosis of the aortic valve.  8. The aorta is normal unless otherwise noted.  9. The aortic root, ascending aorta and aortic arch are normal in size  and structure.  10. The interatrial septum was not well visualized.   Recent Labs: No results found for requested labs within last 8760 hours.   Recent Lipid Panel No results found for: CHOL, TRIG, HDL, CHOLHDL, VLDL, LDLCALC, LDLDIRECT  Physical Exam:   VS:  Pulse 65   Temp (!) 97.3 F (36.3 C)   Resp 18   Ht 5' (1.524 m)   Wt 156 lb 12.8 oz (71.1 kg)   SpO2 92%   BMI 30.62 kg/m    Wt Readings from Last 3 Encounters:  11/09/19 156 lb 12.8 oz (71.1 kg)  06/21/19 156  lb 9.6 oz (71 kg)  03/21/19 155 lb 9.6 oz (70.6 kg)    General: Well nourished, well developed, in no acute distress Heart: Atraumatic, normal size  Eyes: PEERLA, EOMI  Neck: Supple, no JVD Endocrine: No thryomegaly Cardiac: Irregular rhythm, no murmurs rubs or gallops Lungs: Clear to auscultation bilaterally, no wheezing, rhonchi or rales  Abd: Soft, nontender, no hepatomegaly  Ext: Trace edema, venous insufficiency changes noted Musculoskeletal: No deformities, BUE and BLE strength normal and equal Skin: Warm and dry, no rashes  Neuro: Alert and oriented to person, place, time, and situation, CNII-XII grossly intact, no focal deficits  Psych: Normal mood and affect   ASSESSMENT:   Yomari Easler is a 86 y.o. female who presents for the following: 1. Leg edema   2. SOB (shortness of breath)   3. Permanent atrial fibrillation ([place])   4. Essential hypertension     PLAN:   1. Leg edema 2. SOB (shortness of breath) -She presents with venous insufficiency changes and likely swelling due to this.  We will plan to change her losartan HCT Z tablet to include 25 mg of HCTZ.  This will be a 100-25 mg tablet.  This will help with swelling.  I also recommended compression stockings.  She has no elevated JVD and I do not suspect congestive heart failure.  Her volume status looks acceptable to me.  We will check a BNP just to make sure.  We will hold on a switch to Lasix until we see that BNP value.  I think this is just venous insufficiency changes.  3. Permanent atrial fibrillation ([place]) -She will continue her Eliquis 5 mg twice daily.  She is on diltiazem 240 mg extended release for rate control.  Rates are good in control.  4. Essential hypertension -She is on Bystolic but says this is quite expensive.  I recommended metoprolol tartrate 25 mg twice a day.  She will complete her Bystolic dose and then switch to metoprolol at her next refill.   Disposition: Return in about 3 months (around  02/09/2020).  Medication Adjustments/Labs and Tests Ordered: Current medicines are reviewed at length with the patient today.  Concerns regarding medicines are outlined above.  Orders Placed This Encounter  Procedures  . Brain natriuretic peptide   Meds ordered this encounter  Medications  . losartan-hydrochlorothiazide (HYZAAR) 100-25 MG tablet    Sig: Take 1 tablet by mouth daily.    Dispense:  90 tablet    Refill:  1  . metoprolol tartrate (LOPRESSOR) 25 MG tablet    Sig: Take 1 tablet (25 mg total) by mouth 2 (two) times daily.    Dispense:  180 tablet    Refill:  3    Patient Instructions  Medication Instructions:  Stop Bystolic When you finish the Bystolic- start Metoprolol 25 mg twice daily  Increase Hyzar to 100-25 mg daily   *If you need a refill on your cardiac medications before your next appointment, please call your pharmacy*   Lab Work: BNP today   If you have labs (blood work) drawn today and your tests are completely normal, you will receive your results only by: Marland Kitchen MyChart Message (if you have MyChart) OR . A paper copy in the mail If you have any lab test that is abnormal or we need to change your treatment, we will call you to review the results.   Follow-Up: At [facility], you and your health needs are our priority.  As part of our continuing mission to provide you with exceptional heart care, we have created designated Provider Care Teams.  These Care Teams include your primary Cardiologist (physician) and Advanced Practice Providers (APPs -  Physician Assistants and Nurse Practitioners) who all work together to provide you with the care you need, when you need it.  We recommend signing up for the patient portal called "MyChart".  Sign up information is provided on this After Visit Summary.  MyChart is used to connect with patients for Virtual Visits (Telemedicine).  Patients are  able to view lab/test results, encounter notes, upcoming appointments,  etc.  Non-urgent messages can be sent to your provider as well.   To learn more about what you can do with MyChart, go to [email or web address].    Your next appointment:   3 month(s)  The format for your next appointment:   In Person  Provider:   Eleonore Chiquito, MD       Time Spent with Patient: I have spent a total of 35 minutes with patient reviewing hospital notes, telemetry, EKGs, labs and examining the patient as well as establishing an assessment and plan that was discussed with the patient.  > 50% of time was spent in direct patient care.  Signed, Addison Naegeli. Audie Box, [place]  [address], [place], [place] [address] [phone]  11/09/2019 10:16 AM

## 2019-11-09 ENCOUNTER — Other Ambulatory Visit: Payer: Self-pay

## 2019-11-09 ENCOUNTER — Ambulatory Visit: Payer: Medicare Other | Admitting: Cardiovascular Disease

## 2019-11-09 ENCOUNTER — Encounter: Payer: Self-pay | Admitting: Cardiovascular Disease

## 2019-11-09 VITALS — BP 140/80 | HR 65 | Temp 97.3°F | Resp 18 | Ht 60.0 in | Wt 156.8 lb

## 2019-11-09 DIAGNOSIS — I10 Essential (primary) hypertension: Secondary | ICD-10-CM | POA: Diagnosis not present

## 2019-11-09 DIAGNOSIS — R0602 Shortness of breath: Secondary | ICD-10-CM

## 2019-11-09 DIAGNOSIS — R600 Localized edema: Secondary | ICD-10-CM | POA: Diagnosis not present

## 2019-11-09 DIAGNOSIS — I4821 Permanent atrial fibrillation: Secondary | ICD-10-CM

## 2019-11-09 MED ORDER — LOSARTAN POTASSIUM-HCTZ 100-25 MG PO TABS: 1.0000 | ORAL_TABLET | Freq: Every day | ORAL | 1 refills | Status: DC

## 2019-11-09 MED ORDER — METOPROLOL TARTRATE 25 MG PO TABS
25.0000 mg | ORAL_TABLET | Freq: Two times a day (BID) | ORAL | 3 refills | Status: DC
Start: 2019-11-09 — End: 2020-01-27

## 2019-11-09 NOTE — Patient Instructions (Addendum)
Medication Instructions:  Stop Bystolic When you finish the Bystolic- start Metoprolol 25 mg twice daily  Increase Hyzar to 100-25 mg daily   *If you need a refill on your cardiac medications before your next appointment, please call your pharmacy*   Lab Work: BNP today   If you have labs (blood work) drawn today and your tests are completely normal, you will receive your results only by: Marland Kitchen MyChart Message (if you have MyChart) OR . A paper copy in the mail If you have any lab test that is abnormal or we need to change your treatment, we will call you to review the results.   Follow-Up: At [facility], you and your health needs are our priority.  As part of our continuing mission to provide you with exceptional heart care, we have created designated Provider Care Teams.  These Care Teams include your primary Cardiologist (physician) and Advanced Practice Providers (APPs -  Physician Assistants and Nurse Practitioners) who all work together to provide you with the care you need, when you need it.  We recommend signing up for the patient portal called "MyChart".  Sign up information is provided on this After Visit Summary.  MyChart is used to connect with patients for Virtual Visits (Telemedicine).  Patients are able to view lab/test results, encounter notes, upcoming appointments, etc.  Non-urgent messages can be sent to your provider as well.   To learn more about what you can do with MyChart, go to [email or web address].    Your next appointment:   3 month(s)  The format for your next appointment:   In Person  Provider:   Eleonore Chiquito, MD

## 2019-11-10 LAB — BRAIN NATRIURETIC PEPTIDE: BNP: 451.4 pg/mL — ABNORMAL HIGH (ref 0.0–100.0)

## 2019-11-11 ENCOUNTER — Other Ambulatory Visit: Payer: Self-pay

## 2019-11-11 ENCOUNTER — Telehealth: Payer: Self-pay | Admitting: Cardiovascular Disease

## 2019-11-11 MED ORDER — LOSARTAN POTASSIUM 100 MG PO TABS
100.0000 mg | ORAL_TABLET | Freq: Every day | ORAL | 3 refills | Status: DC
Start: 2019-11-11 — End: 2019-11-28

## 2019-11-11 MED ORDER — FUROSEMIDE 20 MG PO TABS
20.0000 mg | ORAL_TABLET | Freq: Every day | ORAL | 3 refills | Status: DC
Start: 2019-11-11 — End: 2019-11-16

## 2019-11-11 NOTE — Telephone Encounter (Signed)
Called Whitney Burton. Elevated BNP. We need to stop her losartan-HCTZ tablet and prescribe losartan 100 mg QD and lasix 20 mg daily. She will let us know how she is doing in the next week or so.   [place]ake Bells T. Audie Box, [place]  [address] [place], [place] [address] [phone]  10:38 AM

## 2019-11-11 NOTE — Telephone Encounter (Signed)
Updated med list, RX sent to pharmacy.   Thanks!

## 2019-11-16 ENCOUNTER — Telehealth: Payer: Self-pay | Admitting: Cardiovascular Disease

## 2019-11-16 MED ORDER — TORSEMIDE 10 MG PO TABS: 10.0000 mg | ORAL_TABLET | Freq: Every day | ORAL | 3 refills | Status: DC

## 2019-11-16 NOTE — Telephone Encounter (Signed)
Please switch her to torsemide 10 mg daily.  I am glad she is doing better.  Please encourage her to take her medication.  Lake Bells T. Audie Box, [place]  [address], [place], [place] [address] [phone]  3:11 PM

## 2019-11-16 NOTE — Telephone Encounter (Signed)
Returned call to patient, patient states she is having issues with the furosemide.  States she started this 4 days ago.   2 hours after taking she is experiencing itching.  States he face, scalp, and eyes become itchy.  She states this last about 2 hours and goes aware.   Denies hives, rash, redness, swelling, etc.    This has happened everytime she has taken this medication.      She states he swelling has significantly improved, although still there in her ankles.  SOB has improved as well.  Does not weight daily.      Advised would route to MD to review and call back with recommendations.     She has meeting til 4-prefers to be called back after this time.

## 2019-11-16 NOTE — Telephone Encounter (Signed)
Spoke to patient, aware of change and verbalized understanding.   rx sent to pharmacy

## 2019-11-16 NOTE — Telephone Encounter (Signed)
New message   Patient has questions about furosemide (LASIX) 20 MG tablet patient states that she is itching and has a rash. Please advise.

## 2019-11-22 ENCOUNTER — Telehealth: Payer: Self-pay | Admitting: Cardiovascular Disease

## 2019-11-22 NOTE — Telephone Encounter (Signed)
Pt c/o medication issue:  1. Name of Medication: torsemide (DEMADEX) 10 MG tablet  2. How are you currently taking this medication (dosage and times per day)? Pt did not take the medication today  3. Are you having a reaction (difficulty breathing--STAT)? Itching   4. What is your medication issue? The patient reports that this medication makes her extremely itchy on her scalp, face and her eyes. She said it has worked well for the swelling in her ankles and her SOB but she has just been so itchy. She said she had the same reaction to the other fluid medication as well.

## 2019-11-23 NOTE — Telephone Encounter (Signed)
Let's try and switch her to bumex 0.5 mg once daily. Stop torsemide.   [place] T. Audie Box, [place]  [address] [place], [place] [address] [phone]  6:49 PM

## 2019-11-23 NOTE — Telephone Encounter (Signed)
Called patient, she states that she has had some issues with the Torsemide and it has caused her to itch, she states that it is better- but she has not taken the medication since yesterday. She states that her eyes still are feeling like she is burning, and red- I did suggest some over the counter eye drops for dry eyes, so patient will look into this.   I did advise that the message was sent to Dr.O'Neal to advise this morning, but he was rounding in the hospital, and once we receive a response to him on this I would call her back.  Patient thankful for callback.

## 2019-11-23 NOTE — Telephone Encounter (Signed)
Pt called to follow up on her call from yesterday. She did not hear back from the office. She has not taken the medication yet today either

## 2019-11-25 MED ORDER — BUMETANIDE 0.5 MG PO TABS: 0.5000 mg | ORAL_TABLET | Freq: Every day | ORAL | 11 refills | Status: DC

## 2019-11-25 NOTE — Telephone Encounter (Signed)
Spoke with pt, aware of o'neal's recommendations. She is going to wait and see if things improve over the weekend and call back if needed. She does not really know her medical doctor because she just moved to this area.

## 2019-11-25 NOTE — Telephone Encounter (Signed)
Follow up    Pt is calling back to follow up  She says she is still having itching  Please call patient back today, she is really upset and doesn't want to feel like this all weekend    Please call

## 2019-11-25 NOTE — Telephone Encounter (Signed)
Spoke with pt, aware of dr Kathalene Frames recommendations. She has been off the torsemide since the 8th and is still having the burning eyes, flushed face and itching. Explained she may need to follow up with her medical doctor as this maybe due to another problem. She was given the okay to take claritin or zyrtec to see if that will help. She wants dr Audie Box to be aware she is still having problems. New script sent to the pharmacy

## 2019-11-25 NOTE — Telephone Encounter (Signed)
Can we get her in for an urgent appointment to see APP?  [place] T. Audie Box, [place]  [address], [place], [place] [address] [phone]  1:07 PM

## 2019-11-28 MED ORDER — LOSARTAN POTASSIUM-HCTZ 100-12.5 MG PO TABS: 1.0000 | ORAL_TABLET | Freq: Every day | ORAL | Status: DC

## 2019-11-28 NOTE — Telephone Encounter (Signed)
Follow up    Pt is calling and asking to speak with Debra    Please call back

## 2019-11-28 NOTE — Telephone Encounter (Addendum)
Spoke with pt, she continues to have problems with eye burning and itching that she feels is related to the diuretics. She has been off of all diuretics since Tuesday last week. She would like to go back to the original losartan/hctz for now. She has an appointment with her medical doctor tomorrow. She does report her swelling maybe a little better with the use of the compression hose and her SOB seems to have improved only a small amount. Okay given for her to restart previous. Lab results faxed to primary per patient request.

## 2019-11-28 NOTE — Addendum Note (Signed)
Addended by: Cristopher Estimable on: 11/28/2019 01:20 PM   Modules accepted: Orders

## 2020-01-24 NOTE — Progress Notes (Signed)
Cardiology Office Note:   Date:  01/27/2020  NAME:  Whitney Burton    MRN: [id number] DOB:  [birth year]   PCP:  Donald Prose, MD  Cardiologist:  Evalina Field, MD   Referring MD: Donald Prose, MD   Chief Complaint  Patient presents with  . Shortness of Breath  . Edema    bilateral ankles    History of Present Illness:   Whitney Burton is a 86 y.o. female with a hx of permanent Afib, HTN, HFpEF who presents for follow-up. Complained of SOB in May. We tried to get her on a diuretic but had reactions to lasix, and torsemide. Follow-up today.  She was seen by her primary care physician for reaction to Lasix.  Apparently she was given some Benadryl and her symptoms resolved.  She is continue to take torsemide 10 mg daily and no further itching.  She reports she did notice dramatic improvement when she started the torsemide.  She reports that her weights have increased about 10 pound since her last visit.  She still getting short of breath with activity.  She reports she also gets cramping in her legs mainly at night.  She reports she is just not able to do activities like she used to.  She did notice dramatic improvement with torsemide but symptoms seem to have returned.  She is also reduce some of the salt intake at her independent living facility.  She reports that they cannot control the salt in the food.  Her blood pressure is 135/70.  Pulse is 82.  She overall seems to be doing well other than possibly worsening heart failure.  Problem List 1. Permanent Atrial Fibrillation  -diagnosed 2019 [facility] -failed DCCV and did not want to do rhythm medications  2. Hypertension 3. HFpEF  -BNP 451 -EF 60-65%  Past Medical History: Past Medical History:  Diagnosis Date  . Arrhythmia   . Hypertension     Past Surgical History: Past Surgical History:  Procedure Laterality Date  . HEMORRHOID SURGERY    . PARTIAL HYSTERECTOMY      Current Medications: Current Meds  Medication Sig  .  torsemide (DEMADEX) 20 MG tablet Take 1 tablet (20 mg total) by mouth daily.  . [DISCONTINUED] torsemide (DEMADEX) 10 MG tablet Take 10 mg by mouth daily.     Allergies:    Furosemide   Social History: Social History   Socioeconomic History  . Marital status: Widowed    Spouse name: Not on file  . Number of children: Not on file  . Years of education: Not on file  . Highest education level: Not on file  Occupational History  . Not on file  Tobacco Use  . Smoking status: Former Research scientist (life sciences)  . Smokeless tobacco: Never Used  Substance and Sexual Activity  . Alcohol use: Yes  . Drug use: Not on file  . Sexual activity: Not on file  Other Topics Concern  . Not on file  Social History Narrative   Widowed. Moved to [place] to be closer to daughter.    Social Determinants of Health   Financial Resource Strain:   . Difficulty of Paying Living Expenses:   Food Insecurity:   . Worried About Charity fundraiser in the Last Year:   . Arboriculturist in the Last Year:   Transportation Needs:   . Film/video editor (Medical):   Marland Kitchen Lack of Transportation (Non-Medical):   Physical Activity:   . Days of  Exercise per Week:   . Minutes of Exercise per Session:   Stress:   . Feeling of Stress :   Social Connections:   . Frequency of Communication with Friends and Family:   . Frequency of Social Gatherings with Friends and Family:   . Attends Religious Services:   . Active Member of Clubs or Organizations:   . Attends Archivist Meetings:   Marland Kitchen Marital Status:      Family History: The patient's family history includes Heart attack in her father.  ROS:   All other ROS reviewed and negative. Pertinent positives noted in the HPI.     EKGs/Labs/Other Studies Reviewed:   The following studies were personally reviewed by me today:   TTE 02/28/2019 1. The left ventricle has normal systolic function with an ejection  fraction of 60-65%. The cavity size was normal. Left  ventricular diastolic  function could not be evaluated secondary to atrial fibrillation. No  evidence of left ventricular regional  wall motion abnormalities.  2. Left atrial size was moderately dilated.  3. Right atrial size was mildly dilated.  4. Small pericardial effusion.  5. The pericardial effusion is circumferential.  6. Tricuspid valve regurgitation is mild-moderate.  7. The aortic valve is tricuspid. No stenosis of the aortic valve.  8. The aorta is normal unless otherwise noted.  9. The aortic root, ascending aorta and aortic arch are normal in size  and structure.  10. The interatrial septum was not well visualized.   Recent Labs: 11/09/2019: BNP 451.4   Recent Lipid Panel No results found for: CHOL, TRIG, HDL, CHOLHDL, VLDL, LDLCALC, LDLDIRECT  Physical Exam:   VS:  BP 135/70   Pulse 82   Ht 5' (1.524 m)   Wt 162 lb (73.5 kg)   SpO2 98%   BMI 31.64 kg/m    Wt Readings from Last 3 Encounters:  01/27/20 162 lb (73.5 kg)  11/09/19 156 lb 12.8 oz (71.1 kg)  06/21/19 156 lb 9.6 oz (71 kg)    General: Well nourished, well developed, in no acute distress Heart: Atraumatic, normal size  Eyes: PEERLA, EOMI  Neck: Supple, no JVD Endocrine: No thryomegaly Cardiac: Normal S1, S2; irregular rhythm no murmurs rubs or gallops Lungs: Clear to auscultation bilaterally, no wheezing, rhonchi or rales  Abd: Soft, nontender, no hepatomegaly  Ext: No edema, pulses 2+ Musculoskeletal: No deformities, BUE and BLE strength normal and equal Skin: Warm and dry, no rashes   Neuro: Alert and oriented to person, place, time, and situation, CNII-XII grossly intact, no focal deficits  Psych: Normal mood and affect   ASSESSMENT:   Whitney Burton is a 86 y.o. female who presents for the following: 1. Leg edema   2. SOB (shortness of breath)   3. Chronic diastolic heart failure ([place])   4. Permanent atrial fibrillation (H[facility])   5. Leg cramping     PLAN:   1. Leg edema 2. SOB  (shortness of breath) 3. Chronic diastolic heart failure ([facility]) -No significant edema on exam today.  She did notice improvement in symptoms with torsemide at first.  Her BNP was elevated.  Her ejection fraction has been normal.  I do suspect she is just having more increased fluid on board.  Since she had such a dramatic improvement with torsemide 10 mg we will increase the dose to 20 mg daily.  She does endorse some leg cramping I suspect hypokalemia.  I would like to check a BMP but she reports  that she will have her labs drawn in the next 1 to 2 days by her primary care physician.  She will let us know what the results of that are.  Would like for her to continue to watch his salt intake.  Also want her to get more active.  She does not appear grossly volume overloaded but we do need to stay on top of this.  Overall I think she will see atraumatic improvement with an increased dose of torsemide.  She will let us know how it goes.  4. Permanent atrial fibrillation ([place]) -Status post TEE/card[facility]version x2 in [facility].  Apparently she did not want to try antirhythm medications.  She had been relatively stable until recently.  She is developed what appears to be diastolic heart failure.  I suspect this is related to increased salty foods in her independent living facility.  She will continue to work on this as above.  Also increase torsemide as above.  Given her age and hypertension and female sex she does merit anticoagulation.  She will continue Eliquis 5 mg twice daily.  Blood pressure well controlled today.  5. Leg cramping -Suspect hypokalemia.  She reports she will wait to have her labs drawn by her primary care physician as she will do her annual labs next week.  She will likely need some potassium supplementation.  She will let us know with the results of her lab work.   Disposition: Return in about 6 months (around 07/29/2020).  Medication Adjustments/Labs and Tests  Ordered: Current medicines are reviewed at length with the patient today.  Concerns regarding medicines are outlined above.  No orders of the defined types were placed in this encounter.  Meds ordered this encounter  Medications  . torsemide (DEMADEX) 20 MG tablet    Sig: Take 1 tablet (20 mg total) by mouth daily.    Dispense:  90 tablet    Refill:  1    Patient Instructions  Medication Instructions:  Increase Torsemide to 20 mg daily   *If you need a refill on your cardiac medications before your next appointment, please call your pharmacy*   Follow-Up: At [facility], you and your health needs are our priority.  As part of our continuing mission to provide you with exceptional heart care, we have created designated Provider Care Teams.  These Care Teams include your primary Cardiologist (physician) and Advanced Practice Providers (APPs -  Physician Assistants and Nurse Practitioners) who all work together to provide you with the care you need, when you need it.  We recommend signing up for the patient portal called "MyChart".  Sign up information is provided on this After Visit Summary.  MyChart is used to connect with patients for Virtual Visits (Telemedicine).  Patients are able to view lab/test results, encounter notes, upcoming appointments, etc.  Non-urgent messages can be sent to your provider as well.   To learn more about what you can do with MyChart, go to [email or web address].    Your next appointment:   6 month(s)  The format for your next appointment:   In Person  Provider:   Eleonore Chiquito, MD   Other Instructions  How to Use Compression Stockings Compression stockings are elastic socks that squeeze the legs. They help increase blood flow (circulation) to the legs, decrease swelling in the legs, and reduce the chance of developing blood clots in the lower legs. Compression stockings are often used by people who:  Are recovering from surgery.  Have poor  circulation in their legs.  Tend to get blood clots in their legs.  Have bulging (varicose) veins.  Sit or stay in bed for long periods of time. Follow instructions from your health care provider about how and when to wear your compression stockings. How to wear compression stockings Before you put on your compression stockings:  Make sure that they are the correct size and degree of compression. If you do not know your size or required grade of compression, ask your health care provider and follow the manufacturer's instructions that come with the stockings.  Make sure that they are clean, dry, and in good condition.  Check them for rips and tears. Do not put them on if they are ripped or torn. Put your stockings on first thing in the morning, before you get out of bed. Keep them on for as long as your health care provider advises. When you are wearing your stockings:  Keep them as smooth as possible. Do not allow them to bunch up. It is especially important to prevent the stockings from bunching up around your toes or behind your knees.  Do not roll the stockings downward and leave them rolled down. This can decrease blood flow to your leg.  Change them right away if they become wet or dirty. When you take off your stockings, inspect your legs and feet. Check for:  Open sores.  Red spots.  Swelling. General tips  Do not stop wearing compression stockings without talking to your health care provider first.  Wash your stockings every day with mild detergent in cold or warm water. Do not use bleach. Air-dry your stockings or dry them in a clothes dryer on low heat. It may be helpful to have two pairs so that you have a pair to wear while the other is being washed.  Replace your stockings every 3-6 months.  If skin moisturizing is part of your treatment plan, apply lotion or cream at night so that your skin will be dry when you put on the stockings in the morning. It is harder to  put the stockings on when you have lotion on your legs or feet.  Wear nonskid shoes or slip-resistant socks when walking while wearing compression stockings. Contact a health care provider and remove your stockings if you have:  A feeling of pins and needles in your feet or legs.  Open sores, red spots, or other skin changes on your feet or legs.  Swelling or pain that gets worse. Get help right away if you have:  Numbness or tingling in your lower legs that does not get better right after you take the stockings off.  Toes or feet that are unusually cold or turn a bluish color.  A warm or red area on your leg.  New swelling or soreness in your leg.  Shortness of breath.  Chest pain.  A fast or irregular heartbeat.  Light-headedness.  Dizziness. Summary  Compression stockings are elastic socks that squeeze the legs.  They help increase blood flow (circulation) to the legs, decrease swelling in the legs, and reduce the chance of developing blood clots in the lower legs.  Follow instructions from your health care provider about how and when to wear your compression stockings.  Do not stop wearing your compression stockings without talking to your health care provider first. This information is not intended to replace advice given to you by your health care provider. Make sure you discuss any questions you  have with your health care provider. Document Revised: 06/04/2017 Document Reviewed: 06/04/2017 Elsevier Patient Education  2020 [place].      Time Spent with Patient: I have spent a total of 25 minutes with patient reviewing hospital notes, telemetry, EKGs, labs and examining the patient as well as establishing an assessment and plan that was discussed with the patient.  > 50% of time was spent in direct patient care.  Signed, Addison Naegeli. Audie Box, [place]  [address] [place], [place] [address] [phone]  01/27/2020 12:25  PM

## 2020-01-27 ENCOUNTER — Ambulatory Visit: Payer: Medicare Other | Admitting: Cardiovascular Disease

## 2020-01-27 ENCOUNTER — Encounter: Payer: Self-pay | Admitting: Cardiovascular Disease

## 2020-01-27 ENCOUNTER — Other Ambulatory Visit: Payer: Self-pay

## 2020-01-27 VITALS — BP 135/70 | HR 82 | Ht 60.0 in | Wt 162.0 lb

## 2020-01-27 DIAGNOSIS — I5032 Chronic diastolic (congestive) heart failure: Secondary | ICD-10-CM | POA: Diagnosis not present

## 2020-01-27 DIAGNOSIS — R600 Localized edema: Secondary | ICD-10-CM

## 2020-01-27 DIAGNOSIS — R0602 Shortness of breath: Secondary | ICD-10-CM | POA: Diagnosis not present

## 2020-01-27 DIAGNOSIS — R252 Cramp and spasm: Secondary | ICD-10-CM

## 2020-01-27 DIAGNOSIS — I4821 Permanent atrial fibrillation: Secondary | ICD-10-CM

## 2020-01-27 MED ORDER — TORSEMIDE 20 MG PO TABS: 20.0000 mg | ORAL_TABLET | Freq: Every day | ORAL | 1 refills | Status: DC

## 2020-01-27 NOTE — Patient Instructions (Signed)
Medication Instructions:  Increase Torsemide to 20 mg daily   *If you need a refill on your cardiac medications before your next appointment, please call your pharmacy*   Follow-Up: At [facility], you and your health needs are our priority.  As part of our continuing mission to provide you with exceptional heart care, we have created designated Provider Care Teams.  These Care Teams include your primary Cardiologist (physician) and Advanced Practice Providers (APPs -  Physician Assistants and Nurse Practitioners) who all work together to provide you with the care you need, when you need it.  We recommend signing up for the patient portal called "MyChart".  Sign up information is provided on this After Visit Summary.  MyChart is used to connect with patients for Virtual Visits (Telemedicine).  Patients are able to view lab/test results, encounter notes, upcoming appointments, etc.  Non-urgent messages can be sent to your provider as well.   To learn more about what you can do with MyChart, go to [email or web address].    Your next appointment:   6 month(s)  The format for your next appointment:   In Person  Provider:   Eleonore Chiquito, MD   Other Instructions  How to Use Compression Stockings Compression stockings are elastic socks that squeeze the legs. They help increase blood flow (circulation) to the legs, decrease swelling in the legs, and reduce the chance of developing blood clots in the lower legs. Compression stockings are often used by people who:  Are recovering from surgery.  Have poor circulation in their legs.  Tend to get blood clots in their legs.  Have bulging (varicose) veins.  Sit or stay in bed for long periods of time. Follow instructions from your health care provider about how and when to wear your compression stockings. How to wear compression stockings Before you put on your compression stockings:  Make sure that they are the correct size and degree  of compression. If you do not know your size or required grade of compression, ask your health care provider and follow the manufacturer's instructions that come with the stockings.  Make sure that they are clean, dry, and in good condition.  Check them for rips and tears. Do not put them on if they are ripped or torn. Put your stockings on first thing in the morning, before you get out of bed. Keep them on for as long as your health care provider advises. When you are wearing your stockings:  Keep them as smooth as possible. Do not allow them to bunch up. It is especially important to prevent the stockings from bunching up around your toes or behind your knees.  Do not roll the stockings downward and leave them rolled down. This can decrease blood flow to your leg.  Change them right away if they become wet or dirty. When you take off your stockings, inspect your legs and feet. Check for:  Open sores.  Red spots.  Swelling. General tips  Do not stop wearing compression stockings without talking to your health care provider first.  Wash your stockings every day with mild detergent in cold or warm water. Do not use bleach. Air-dry your stockings or dry them in a clothes dryer on low heat. It may be helpful to have two pairs so that you have a pair to wear while the other is being washed.  Replace your stockings every 3-6 months.  If skin moisturizing is part of your treatment plan, apply lotion or cream at night  so that your skin will be dry when you put on the stockings in the morning. It is harder to put the stockings on when you have lotion on your legs or feet.  Wear nonskid shoes or slip-resistant socks when walking while wearing compression stockings. Contact a health care provider and remove your stockings if you have:  A feeling of pins and needles in your feet or legs.  Open sores, red spots, or other skin changes on your feet or legs.  Swelling or pain that gets  worse. Get help right away if you have:  Numbness or tingling in your lower legs that does not get better right after you take the stockings off.  Toes or feet that are unusually cold or turn a bluish color.  A warm or red area on your leg.  New swelling or soreness in your leg.  Shortness of breath.  Chest pain.  A fast or irregular heartbeat.  Light-headedness.  Dizziness. Summary  Compression stockings are elastic socks that squeeze the legs.  They help increase blood flow (circulation) to the legs, decrease swelling in the legs, and reduce the chance of developing blood clots in the lower legs.  Follow instructions from your health care provider about how and when to wear your compression stockings.  Do not stop wearing your compression stockings without talking to your health care provider first. This information is not intended to replace advice given to you by your health care provider. Make sure you discuss any questions you have with your health care provider. Document Revised: 06/04/2017 Document Reviewed: 06/04/2017 Elsevier Patient Education  2020 [employer].

## 2020-01-28 ENCOUNTER — Other Ambulatory Visit: Payer: Self-pay | Admitting: Cardiovascular Disease

## 2020-02-07 ENCOUNTER — Telehealth: Payer: Self-pay | Admitting: Cardiovascular Disease

## 2020-02-07 NOTE — Telephone Encounter (Signed)
Okay  to use Volaren gel as prescribed.  Topical product with minimal systemic absorption and no significant interaction with other medication or disease expected.

## 2020-02-07 NOTE — Telephone Encounter (Signed)
Forward to  Pharmacist for review

## 2020-02-07 NOTE — Telephone Encounter (Signed)
    Pt c/o medication issue:  1. Name of Medication: voltaren gel   2. How are you currently taking this medication (dosage and times per day)?   3. Are you having a reaction (difficulty breathing--STAT)?  4. What is your medication issue? Pt would like to know if its safe for her to use voltaren gel to apply it on her wrist and knee, if its not going to effect with her heart meds.

## 2020-02-08 NOTE — Telephone Encounter (Signed)
Patient called today to check on status of her call yesterday. She stated if she does not pick up her phone a detailed message can be left.

## 2020-02-08 NOTE — Telephone Encounter (Signed)
Patient made aware of the following recommendations.   Rodriguez-Guzman, Raquel, RPH-CPP 22 hours ago (12:38 PM)     Okay  to use Volaren gel as prescribed.  Topical product with minimal systemic absorption and no significant interaction with other medication or disease expected.

## 2020-03-23 ENCOUNTER — Other Ambulatory Visit: Payer: Self-pay | Admitting: Cardiovascular Disease

## 2020-03-23 MED ORDER — LOSARTAN POTASSIUM-HCTZ 100-12.5 MG PO TABS: 1.0000 | ORAL_TABLET | Freq: Every day | ORAL | 2 refills | Status: DC

## 2020-03-23 NOTE — Telephone Encounter (Signed)
*  STAT* If patient is at the pharmacy, call can be transferred to refill team.   1. Which medications need to be refilled? (please list name of each medication and dose if known) losartan-hydrochlorothiazide (HYZAAR) 100-12.5 MG tablet  2. Which pharmacy/location (including street and city if local pharmacy) is medication to be sent to? [id number] - [place], [place] - [place]. AT [address]  3. Do they need a 30 day or 90 day supply? 90 day

## 2020-06-24 ENCOUNTER — Other Ambulatory Visit: Payer: Self-pay | Admitting: Cardiovascular Disease

## 2020-06-26 NOTE — Progress Notes (Signed)
Cardiology Office Note:   Date:  06/27/2020  NAME:  Whitney Burton    MRN: [id number] DOB:  [birth year]   PCP:  Donald Prose, MD  Cardiologist:  Evalina Field, MD  Electrophysiologist:  None   Referring MD: Donald Prose, MD   Chief Complaint  Patient presents with  . Hypertension         History of Present Illness:   Whitney Burton is a 87 y.o. female with a hx of atrial fibrillation, hypertension, HFpEF who presents for follow-up of blood pressure.  She reports she had her blood pressure checked in her assisted living facility yesterday with a value around 158/98.  She checked it at home after this and the value was 152/100.  She apparently calm down and relax and blood pressure came down in the 120s over 80 range.  Blood pressure this morning 141/86.  On repeat here in the office 130/78.  She reports blood pressure seems to have gotten better with a bit of D stressing.  She remains on losartan 100 mg a day, diltiazem 585 mg a day, Bystolic 20 mg twice a day and torsemide 20 mg a day as needed.  Weights are stable.  She reports she does eat short of breath with exertion and this is improved with torsemide.  She denies any chest pain or palpitations.  EKG shows heart rate is 80.  She reports that she does have some neck pain and this is improved with heat and ice.  Overall appears to be doing well.  Blood pressure much better controlled.  No evidence of volume overload on examination today.  She is exercising walking up and down the halls 4 times per day.  She is trying to do more.  She has worked on reducing salt intake in her facility.  Problem List 1. Permanent Atrial Fibrillation  -diagnosed 2019 [place]  -failed DCCV and did not want to do rhythm medications  -CHADSVASC= 4 (age, sex, HTN) 2. Hypertension 3. HFpEF  -BNP 451 -EF 60-65%  Past Medical History: Past Medical History:  Diagnosis Date  . Arrhythmia   . Hypertension     Past Surgical History: Past Surgical History:   Procedure Laterality Date  . HEMORRHOID SURGERY    . PARTIAL HYSTERECTOMY      Current Medications: Current Meds  Medication Sig  . acetaminophen (TYLENOL) 500 MG tablet 1 tablet as needed  . Cholecalciferol (VITAMIN D3) 25 MCG (1000 UT) CAPS Take by mouth. Take 1000 IU daily  . torsemide (DEMADEX) 20 MG tablet Take 1 tablet (20 mg total) by mouth daily.  . [DISCONTINUED] apixaban (ELIQUIS) 5 MG TABS tablet Take 1 tablet (5 mg total) by mouth 2 (two) times daily.  . [DISCONTINUED] BYSTOLIC 20 MG TABS Take 1 tablet by mouth 2 (two) times daily.  . [DISCONTINUED] losartan (COZAAR) 100 MG tablet 1 tablet  . [DISCONTINUED] TIADYLT ER 240 MG 24 hr capsule TAKE 1 CAPSULE BY MOUTH EVERY DAY     Allergies:    Furosemide   Social History: Social History   Socioeconomic History  . Marital status: Widowed    Spouse name: Not on file  . Number of children: Not on file  . Years of education: Not on file  . Highest education level: Not on file  Occupational History  . Not on file  Tobacco Use  . Smoking status: Former Research scientist (life sciences)  . Smokeless tobacco: Never Used  Substance and Sexual Activity  . Alcohol use: Yes  .  Drug use: Not on file  . Sexual activity: Not on file  Other Topics Concern  . Not on file  Social History Narrative   Widowed. Moved to [place] to be closer to daughter.    Social Determinants of Health   Financial Resource Strain: Not on file  Food Insecurity: Not on file  Transportation Needs: Not on file  Physical Activity: Not on file  Stress: Not on file  Social Connections: Not on file     Family History: The patient's family history includes Heart attack in her father.  ROS:   All other ROS reviewed and negative. Pertinent positives noted in the HPI.     EKGs/Labs/Other Studies Reviewed:   The following studies were personally reviewed by me today:  EKG:  EKG is ordered today.  The ekg ordered today demonstrates atrial fibrillation heart rate 80, no  acute ischemic changes, no prior infarct, poor R wave progression noted, and was personally reviewed by me.   TTE 02/28/2019 1. The left ventricle has normal systolic function with an ejection  fraction of 60-65%. The cavity size was normal. Left ventricular diastolic  function could not be evaluated secondary to atrial fibrillation. No  evidence of left ventricular regional  wall motion abnormalities.  2. Left atrial size was moderately dilated.  3. Right atrial size was mildly dilated.  4. Small pericardial effusion.  5. The pericardial effusion is circumferential.  6. Tricuspid valve regurgitation is mild-moderate.  7. The aortic valve is tricuspid. No stenosis of the aortic valve.  8. The aorta is normal unless otherwise noted.  9. The aortic root, ascending aorta and aortic arch are normal in size  and structure.  10. The interatrial septum was not well visualized.   Recent Labs: 11/09/2019: BNP 451.4   Recent Lipid Panel No results found for: CHOL, TRIG, HDL, CHOLHDL, VLDL, LDLCALC, LDLDIRECT  Physical Exam:   VS:  BP 130/78   Pulse 80   Ht 5' (1.524 m)   Wt 163 lb 9.6 oz (74.2 kg)   SpO2 97%   BMI 31.95 kg/m    Wt Readings from Last 3 Encounters:  06/27/20 163 lb 9.6 oz (74.2 kg)  01/27/20 162 lb (73.5 kg)  11/09/19 156 lb 12.8 oz (71.1 kg)    General: Well nourished, well developed, in no acute distress Head: Atraumatic, normal size  Eyes: PEERLA, EOMI  Neck: Supple, no JVD Endocrine: No thryomegaly Cardiac: Normal S1, S2; irregular rhythm, no murmurs rubs or gallops Lungs: Clear to auscultation bilaterally, no wheezing, rhonchi or rales  Abd: Soft, nontender, no hepatomegaly  Ext: No edema, pulses 2+ Musculoskeletal: No deformities, BUE and BLE strength normal and equal Skin: Warm and dry, no rashes   Neuro: Alert and oriented to person, place, time, and situation, CNII-XII grossly intact, no focal deficits  Psych: Normal mood and affect    ASSESSMENT:   Whitney Burton is a 87 y.o. female who presents for the following: 1. Chronic diastolic heart failure ([place])   2. Permanent atrial fibrillation ([place])   3. Essential hypertension     PLAN:   1. Chronic diastolic heart failure (HCC) -Euvolemic on exam.  Continue torsemide 20 mg every other day.  2. Permanent atrial fibrillation (HCC) -Rate controlled on diltiazem 240 mg extended release tablet.  Also on Bystolic 20 mg twice daily.  EKG shows heart rate is 80. -CHA2DS2-VASc 4 4.  Continue Eliquis 5 mg twice daily.  No bleeding.  3. Essential hypertension -BP well controlled.  I suspect she was just having elevations due to stress and recent exertion.  Blood pressure 130/78.  She should be taking losartan 100 mg a day and Bystolic 20 mg twice daily.  Disposition: Return in about 3 months (around 09/25/2020).  Medication Adjustments/Labs and Tests Ordered: Current medicines are reviewed at length with the patient today.  Concerns regarding medicines are outlined above.  Orders Placed This Encounter  Procedures  . EKG 12-Lead   Meds ordered this encounter  Medications  . losartan (COZAAR) 100 MG tablet    Sig: Take 1 tablet by mouth daily    Dispense:  90 tablet    Refill:  1  . apixaban (ELIQUIS) 5 MG TABS tablet    Sig: Take 1 tablet (5 mg total) by mouth 2 (two) times daily.    Dispense:  180 tablet    Refill:  1  . BYSTOLIC 20 MG TABS    Sig: Take 1 tablet (20 mg total) by mouth 2 (two) times daily.    Dispense:  180 tablet    Refill:  1  . TIADYLT ER 240 MG 24 hr capsule    Sig: TAKE 1 CAPSULE BY MOUTH EVERY DAY    Dispense:  90 capsule    Refill:  1    Patient Instructions  Medication Instructions:  The current medical regimen is effective;  continue present plan and medications.  *If you need a refill on your cardiac medications before your next appointment, please call your pharmacy*  Follow-Up: At [facility], you and your health needs are our  priority.  As part of our continuing mission to provide you with exceptional heart care, we have created designated Provider Care Teams.  These Care Teams include your primary Cardiologist (physician) and Advanced Practice Providers (APPs -  Physician Assistants and Nurse Practitioners) who all work together to provide you with the care you need, when you need it.  We recommend signing up for the patient portal called "MyChart".  Sign up information is provided on this After Visit Summary.  MyChart is used to connect with patients for Virtual Visits (Telemedicine).  Patients are able to view lab/test results, encounter notes, upcoming appointments, etc.  Non-urgent messages can be sent to your provider as well.   To learn more about what you can do with MyChart, go to [email or web address].    Your next appointment:   3 month(s)  The format for your next appointment:   In Person  Provider:   Eleonore Chiquito, MD        Time Spent with Patient: I have spent a total of 25 minutes with patient reviewing hospital notes, telemetry, EKGs, labs and examining the patient as well as establishing an assessment and plan that was discussed with the patient.  > 50% of time was spent in direct patient care.  Signed, Addison Naegeli. Audie Box, Eleanor  [address], [place],  [address] [phone]  06/27/2020 11:37 AM

## 2020-06-27 ENCOUNTER — Encounter: Payer: Self-pay | Admitting: Cardiovascular Disease

## 2020-06-27 ENCOUNTER — Other Ambulatory Visit: Payer: Self-pay

## 2020-06-27 ENCOUNTER — Ambulatory Visit: Payer: Medicare Other | Admitting: Cardiovascular Disease

## 2020-06-27 VITALS — BP 130/78 | HR 80 | Ht 60.0 in | Wt 163.6 lb

## 2020-06-27 DIAGNOSIS — I4821 Permanent atrial fibrillation: Secondary | ICD-10-CM

## 2020-06-27 DIAGNOSIS — I10 Essential (primary) hypertension: Secondary | ICD-10-CM | POA: Diagnosis not present

## 2020-06-27 DIAGNOSIS — I5032 Chronic diastolic (congestive) heart failure: Secondary | ICD-10-CM | POA: Diagnosis not present

## 2020-06-27 MED ORDER — TIADYLT ER 240 MG PO CP24: ORAL_CAPSULE | ORAL | 1 refills | Status: DC

## 2020-06-27 MED ORDER — APIXABAN 5 MG PO TABS: 5.0000 mg | ORAL_TABLET | Freq: Two times a day (BID) | ORAL | 1 refills | Status: DC

## 2020-06-27 MED ORDER — BYSTOLIC 20 MG PO TABS: 1.0000 | ORAL_TABLET | Freq: Two times a day (BID) | ORAL | 1 refills | Status: DC

## 2020-06-27 MED ORDER — LOSARTAN POTASSIUM 100 MG PO TABS: ORAL_TABLET | ORAL | 1 refills | Status: DC

## 2020-06-27 NOTE — Patient Instructions (Signed)
Medication Instructions:  The current medical regimen is effective;  continue present plan and medications.  *If you need a refill on your cardiac medications before your next appointment, please call your pharmacy*   Follow-Up: At [facility], you and your health needs are our priority.  As part of our continuing mission to provide you with exceptional heart care, we have created designated Provider Care Teams.  These Care Teams include your primary Cardiologist (physician) and Advanced Practice Providers (APPs -  Physician Assistants and Nurse Practitioners) who all work together to provide you with the care you need, when you need it.  We recommend signing up for the patient portal called "MyChart".  Sign up information is provided on this After Visit Summary.  MyChart is used to connect with patients for Virtual Visits (Telemedicine).  Patients are able to view lab/test results, encounter notes, upcoming appointments, etc.  Non-urgent messages can be sent to your provider as well.   To learn more about what you can do with MyChart, go to [email or web address].    Your next appointment:   3 month(s)  The format for your next appointment:   In Person  Provider:   [place] O'Neal, MD     

## 2020-06-29 ENCOUNTER — Telehealth: Payer: Self-pay | Admitting: Cardiovascular Disease

## 2020-06-29 ENCOUNTER — Other Ambulatory Visit: Payer: Self-pay | Admitting: Cardiovascular Disease

## 2020-06-29 MED ORDER — NEBIVOLOL HCL 20 MG PO TABS: 1.0000 | ORAL_TABLET | Freq: Two times a day (BID) | ORAL | 3 refills | Status: DC

## 2020-06-29 NOTE — Telephone Encounter (Signed)
New Message:     Pt said she went to pick up here medicine at the pharmacy and they had Bystolic for her. Pt says she have been taking Nebivolol instead of Bystolic for the last 30 days.

## 2020-06-29 NOTE — Telephone Encounter (Signed)
Spoke to patient she stated she has been taking generic bystolic for the past 1 month.Stated pharmacy will not refill due to order saying brand name only.Order phone into pharmacy.

## 2020-06-29 NOTE — Addendum Note (Signed)
Addended by: Kathyrn Lass on: 06/29/2020 10:21 AM   Modules accepted: Orders

## 2020-07-24 ENCOUNTER — Ambulatory Visit: Payer: Medicare Other | Admitting: Cardiovascular Disease

## 2020-08-17 DIAGNOSIS — D1801 Hemangioma of skin and subcutaneous tissue: Secondary | ICD-10-CM | POA: Diagnosis not present

## 2020-08-17 DIAGNOSIS — D225 Melanocytic nevi of trunk: Secondary | ICD-10-CM | POA: Diagnosis not present

## 2020-08-17 DIAGNOSIS — L814 Other melanin hyperpigmentation: Secondary | ICD-10-CM | POA: Diagnosis not present

## 2020-08-17 DIAGNOSIS — D485 Neoplasm of uncertain behavior of skin: Secondary | ICD-10-CM | POA: Diagnosis not present

## 2020-08-17 DIAGNOSIS — L300 Nummular dermatitis: Secondary | ICD-10-CM | POA: Diagnosis not present

## 2020-08-17 DIAGNOSIS — L821 Other seborrheic keratosis: Secondary | ICD-10-CM | POA: Diagnosis not present

## 2020-08-17 DIAGNOSIS — L918 Other hypertrophic disorders of the skin: Secondary | ICD-10-CM | POA: Diagnosis not present

## 2020-08-17 DIAGNOSIS — D2271 Melanocytic nevi of right lower limb, including hip: Secondary | ICD-10-CM | POA: Diagnosis not present

## 2020-08-17 DIAGNOSIS — L905 Scar conditions and fibrosis of skin: Secondary | ICD-10-CM | POA: Diagnosis not present

## 2020-08-23 DIAGNOSIS — B351 Tinea unguium: Secondary | ICD-10-CM | POA: Diagnosis not present

## 2020-09-04 DIAGNOSIS — D6869 Other thrombophilia: Secondary | ICD-10-CM | POA: Diagnosis not present

## 2020-09-04 DIAGNOSIS — G2581 Restless legs syndrome: Secondary | ICD-10-CM | POA: Diagnosis not present

## 2020-09-04 DIAGNOSIS — M549 Dorsalgia, unspecified: Secondary | ICD-10-CM | POA: Diagnosis not present

## 2020-09-04 DIAGNOSIS — I4891 Unspecified atrial fibrillation: Secondary | ICD-10-CM | POA: Diagnosis not present

## 2020-09-04 DIAGNOSIS — R0989 Other specified symptoms and signs involving the circulatory and respiratory systems: Secondary | ICD-10-CM | POA: Diagnosis not present

## 2020-09-04 DIAGNOSIS — M542 Cervicalgia: Secondary | ICD-10-CM | POA: Diagnosis not present

## 2020-09-04 DIAGNOSIS — M1711 Unilateral primary osteoarthritis, right knee: Secondary | ICD-10-CM | POA: Diagnosis not present

## 2020-09-23 NOTE — Progress Notes (Signed)
Cardiology Office Note:   Date:  09/25/2020  NAME:  Whitney Burton    MRN: [id number] DOB:  [birth year]   PCP:  Whitney Prose, MD  Cardiologist:  Whitney Field, MD  Electrophysiologist:  None   Referring MD: Whitney Prose, MD   Chief Complaint  Patient presents with  . Atrial Fibrillation    History of Present Illness:   Whitney Burton is a 87 y.o. female with a hx of HFpEF, atrial fibrillation, HTN who presents for follow-up. On torsemide every other day.  She seems to be doing well.  Blood pressure slightly elevated 153/87.  She is still getting short of breath.  She is not active.  She is living and independent living.  She has questions about Bystolic.  I did discuss with her that we try to change blood pressure medications we had issues.  She will go back on this.  She also is on Eliquis.  No bleeding issues.  A. fib is well controlled.  Heart rates in the 70s.  She is watching the salt.  Weights are up 3 pounds.  Does not appear volume up.  I suspect she is just eating a little more desserts than usual.  She will watch this.  I did inform her that she overall is doing well.  Denies any chest pain.  She should check her blood pressure daily.  Problem List 1. Permanent Atrial Fibrillation  -diagnosed 2019 [place] -failed DCCV and did not want to do rhythm medications  -CHADSVASC= 4 (age, sex, HTN) 2. Hypertension 3. HFpEF  -BNP 451 -EF60-65%  Past Medical History: Past Medical History:  Diagnosis Date  . Arrhythmia   . Hypertension     Past Surgical History: Past Surgical History:  Procedure Laterality Date  . HEMORRHOID SURGERY    . PARTIAL HYSTERECTOMY      Current Medications: Current Meds  Medication Sig  . acetaminophen (TYLENOL) 500 MG tablet 1 tablet as needed  . Cholecalciferol (VITAMIN D3) 25 MCG (1000 UT) CAPS Take by mouth. Take 1000 IU daily  . losartan (COZAAR) 100 MG tablet Take 1 tablet by mouth daily  . Nebivolol HCl 20 MG TABS Take 1 tablet (20 mg  total) by mouth 2 (two) times daily.  Whitney Burton ER 240 MG 24 hr capsule TAKE 1 CAPSULE BY MOUTH EVERY DAY  . torsemide (DEMADEX) 20 MG tablet Take 1 tablet (20 mg total) by mouth daily.  . [DISCONTINUED] apixaban (ELIQUIS) 5 MG TABS tablet Take 1 tablet (5 mg total) by mouth 2 (two) times daily.     Allergies:    Furosemide   Social History: Social History   Socioeconomic History  . Marital status: Widowed    Spouse name: Not on file  . Number of children: Not on file  . Years of education: Not on file  . Highest education level: Not on file  Occupational History  . Not on file  Tobacco Use  . Smoking status: Former Research scientist (life sciences)  . Smokeless tobacco: Never Used  Substance and Sexual Activity  . Alcohol use: Yes  . Drug use: Not on file  . Sexual activity: Not on file  Other Topics Concern  . Not on file  Social History Narrative   Widowed. Moved to [place] to be closer to daughter.    Social Determinants of Health   Financial Resource Strain: Not on file  Food Insecurity: Not on file  Transportation Needs: Not on file  Physical Activity: Not on file  Stress: Not on file  Social Connections: Not on file     Family History: The patient's family history includes Heart attack in her father.  ROS:   All other ROS reviewed and negative. Pertinent positives noted in the HPI.     EKGs/Labs/Other Studies Reviewed:   The following studies were personally reviewed by me today:  Recent Labs: 11/09/2019: BNP 451.4   Recent Lipid Panel No results found for: CHOL, TRIG, HDL, CHOLHDL, VLDL, LDLCALC, LDLDIRECT  Physical Exam:   VS:  BP (!) 153/87 (BP Location: Right Arm, Patient Position: Sitting)   Pulse 78   Ht 5' (1.524 m)   Wt 166 lb 3.2 oz (75.4 kg)   SpO2 100%   BMI 32.46 kg/m    Wt Readings from Last 3 Encounters:  09/25/20 166 lb 3.2 oz (75.4 kg)  06/27/20 163 lb 9.6 oz (74.2 kg)  01/27/20 162 lb (73.5 kg)    General: Well nourished, well developed, in no  acute distress Head: Atraumatic, normal size  Eyes: PEERLA, EOMI  Neck: Supple, no JVD Endocrine: No thryomegaly Cardiac: Normal S1, S2; irregular rhythm, no murmurs rubs Lungs: Clear to auscultation bilaterally, no wheezing, rhonchi or rales  Abd: Soft, nontender, no hepatomegaly  Ext: No edema, pulses 2+ Musculoskeletal: No deformities, BUE and BLE strength normal and equal Skin: Warm and dry, no rashes   Neuro: Alert and oriented to person, place, time, and situation, CNII-XII grossly intact, no focal deficits  Psych: Normal mood and affect   ASSESSMENT:   Whitney Burton is a 87 y.o. female who presents for the following: 1. Chronic diastolic heart failure ([place])   2. Permanent atrial fibrillation ([place])   3. Essential hypertension     PLAN:   1. Chronic diastolic heart failure (HCC) -Euvolemic on examination.  Weights are a bit up.  I suspect this is related to increased dessert intake.  She will watch this.  She is taking her torsemide every other day.  She should continue with this.  2. Permanent atrial fibrillation (HCC) -Rates are well controlled on diltiazem extended release 240 mg daily as well as nebivolol.  She will continue this.  She is on Eliquis 5 mg twice daily.  Dose is appropriate given weight and age.  She has normal kidney function.  3. Essential hypertension -BP slightly elevated.  She should continue her diltiazem and nebivolol.  I would like for her to keep a log.  I would like to see what her blood pressure runs when she does not take her torsemide.  She takes every other day.  She will keep an eye on this.  Disposition: Return in about 6 months (around 03/27/2021).  Medication Adjustments/Labs and Tests Ordered: Current medicines are reviewed at length with the patient today.  Concerns regarding medicines are outlined above.  No orders of the defined types were placed in this encounter.  Meds ordered this encounter  Medications  . apixaban (ELIQUIS) 5 MG  TABS tablet    Sig: Take 1 tablet (5 mg total) by mouth 2 (two) times daily.    Dispense:  180 tablet    Refill:  1    Patient Instructions  Medication Instructions:  The current medical regimen is effective;  continue present plan and medications.  *If you need a refill on your cardiac medications before your next appointment, please call your pharmacy*   Follow-Up: At [facility], you and your health needs are our priority.  As part of our continuing mission  to provide you with exceptional heart care, we have created designated Provider Care Teams.  These Care Teams include your primary Cardiologist (physician) and Advanced Practice Providers (APPs -  Physician Assistants and Nurse Practitioners) who all work together to provide you with the care you need, when you need it.  We recommend signing up for the patient portal called "MyChart".  Sign up information is provided on this After Visit Summary.  MyChart is used to connect with patients for Virtual Visits (Telemedicine).  Patients are able to view lab/test results, encounter notes, upcoming appointments, etc.  Non-urgent messages can be sent to your provider as well.   To learn more about what you can do with MyChart, go to [email or web address].    Your next appointment:   6 month(s)  The format for your next appointment:   In Person  Provider:   Eleonore Chiquito, MD   Other Instructions Keep blood pressure log.     Time Spent with Patient: I have spent a total of 25 minutes with patient reviewing hospital notes, telemetry, EKGs, labs and examining the patient as well as establishing an assessment and plan that was discussed with the patient.  > 50% of time was spent in direct patient care.  Signed, Addison Naegeli. Audie Box, MD, [place]  [address] [place], [place] [address] [phone]  09/25/2020 11:26 AM

## 2020-09-25 ENCOUNTER — Other Ambulatory Visit: Payer: Self-pay

## 2020-09-25 ENCOUNTER — Encounter: Payer: Self-pay | Admitting: Cardiovascular Disease

## 2020-09-25 ENCOUNTER — Ambulatory Visit: Payer: Medicare Other | Admitting: Cardiovascular Disease

## 2020-09-25 VITALS — BP 153/87 | HR 78 | Ht 60.0 in | Wt 166.2 lb

## 2020-09-25 DIAGNOSIS — I5032 Chronic diastolic (congestive) heart failure: Secondary | ICD-10-CM

## 2020-09-25 DIAGNOSIS — I10 Essential (primary) hypertension: Secondary | ICD-10-CM | POA: Diagnosis not present

## 2020-09-25 DIAGNOSIS — I4821 Permanent atrial fibrillation: Secondary | ICD-10-CM | POA: Diagnosis not present

## 2020-09-25 MED ORDER — ELIQUIS 5 MG PO TABS: 5.0000 mg | ORAL_TABLET | Freq: Two times a day (BID) | ORAL | 1 refills | Status: DC

## 2020-09-25 NOTE — Patient Instructions (Signed)
Medication Instructions:  The current medical regimen is effective;  continue present plan and medications.  *If you need a refill on your cardiac medications before your next appointment, please call your pharmacy*   Follow-Up: At [facility], you and your health needs are our priority.  As part of our continuing mission to provide you with exceptional heart care, we have created designated Provider Care Teams.  These Care Teams include your primary Cardiologist (physician) and Advanced Practice Providers (APPs -  Physician Assistants and Nurse Practitioners) who all work together to provide you with the care you need, when you need it.  We recommend signing up for the patient portal called "MyChart".  Sign up information is provided on this After Visit Summary.  MyChart is used to connect with patients for Virtual Visits (Telemedicine).  Patients are able to view lab/test results, encounter notes, upcoming appointments, etc.  Non-urgent messages can be sent to your provider as well.   To learn more about what you can do with MyChart, go to [email or web address].    Your next appointment:   6 month(s)  The format for your next appointment:   In Person  Provider:   Eleonore Chiquito, MD   Other Instructions Keep blood pressure log.

## 2020-09-27 DIAGNOSIS — H2513 Age-related nuclear cataract, bilateral: Secondary | ICD-10-CM | POA: Diagnosis not present

## 2020-09-27 DIAGNOSIS — H40023 Open angle with borderline findings, high risk, bilateral: Secondary | ICD-10-CM | POA: Diagnosis not present

## 2020-09-27 DIAGNOSIS — H524 Presbyopia: Secondary | ICD-10-CM | POA: Diagnosis not present

## 2020-10-02 DIAGNOSIS — L988 Other specified disorders of the skin and subcutaneous tissue: Secondary | ICD-10-CM | POA: Diagnosis not present

## 2020-10-02 DIAGNOSIS — D485 Neoplasm of uncertain behavior of skin: Secondary | ICD-10-CM | POA: Diagnosis not present

## 2020-10-26 ENCOUNTER — Telehealth: Payer: Self-pay | Admitting: Cardiovascular Disease

## 2020-10-26 NOTE — Telephone Encounter (Signed)
Spoke to pt. She report for the past few days she hasn't been feeling well so she decided to check her BP. She state initially her BP was 162/122 but she took one of her torsemide and then BP decreased to 140/106. Pt did admit she doesn't take torsemide daily as prescribed and try to take it at least every other day. Pt also state she hasn't been sleeping well and been up for the past 2 nights. She is wondering if MD is ok with her calling pcp for melatonin. Pt also report she continue to experience SOB with exertion, which limits her ability to do certain things.    Will forward to MD for recommendations.

## 2020-10-26 NOTE — Telephone Encounter (Signed)
Pt c/o BP issue: STAT if pt c/o blurred vision, one-sided weakness or slurred speech  1. What are your last 5 BP readings? 162/129  2. Are you having any other symptoms (ex. Dizziness, headache, blurred vision, passed out)? No   3. What is your BP issue? Patient states BP is high    Pt c/o Shortness Of Breath: STAT if SOB developed within the last 24 hours or pt is noticeably SOB on the phone  1. Are you currently SOB (can you hear that pt is SOB on the phone)? yes  2. How long have you been experiencing SOB? AFIB   3. Are you SOB when sitting or when up moving around? both  4. Are you currently experiencing any other symptoms? No

## 2020-10-28 NOTE — Telephone Encounter (Signed)
OK to start melatonin. It is something she can buy over the counter. 5 mg nightly is the starting dose and she can go up to 10 mg nightly if needed.   Regarding her BP and symptoms, this is related to fluid. I would recommend she take her torsemide daily for 3 days and continue to check her BP. Let us know how it looks in the next week or so.   [place] T. Audie Box, MD, [place]  [address], [place], [place] [address] [phone]  10:51 AM

## 2020-10-29 MED ORDER — MELATONIN 5 MG PO TABS: 5.0000 mg | ORAL_TABLET | Freq: Every evening | ORAL | 0 refills | Status: DC | PRN

## 2020-10-29 NOTE — Telephone Encounter (Signed)
Pt updated with MD's recommendations and state she is already started back taking torsemide daily and BP is better. She state this morning it was 138/90. Pt also report she overall feels so much better.   Pt state she will pick up OTC melatonin to take as needed.

## 2020-12-29 ENCOUNTER — Other Ambulatory Visit: Payer: Self-pay | Admitting: Cardiovascular Disease

## 2021-02-04 ENCOUNTER — Telehealth: Payer: Self-pay | Admitting: Cardiovascular Disease

## 2021-02-04 NOTE — Telephone Encounter (Signed)
Pt c/o Shortness Of Breath: STAT if SOB developed within the last 24 hours or pt is noticeably SOB on the phone  1. Are you currently SOB (can you hear that pt is SOB on the phone)? no  2. How long have you been experiencing SOB? A couple weeks  3. Are you SOB when sitting or when up moving around? Starts a few hours after getting up   4. Are you currently experiencing any other symptoms? no   Patient states she has been having SOB for the last couple of weeks. She states it starts a couple hours after she gets up. She says she has also had a hard time trying to go to bed, and has not been sleeping well.

## 2021-02-04 NOTE — Telephone Encounter (Signed)
Returned call to Pt.    Per Pt she has trouble with shortness of breath.  States once she is up and moving around that's when she notices the SOB and needs to stop to rest.  Pt states she takes her torsemide every day except for Monday and Wednesdays because she goes to her assisted living meetings throughout the day.   Pt does not check her BP.  States she used to, but it frustrates her to see the numbers when they are high.  She said she used to have high blood pressure, and does not feel like it is high now.  She states she has gained more weight.  She states she has to plan her day, she can only run one errand and then she has to come home and rest.  She wishes she had "more get up and go".  States she is tired.  Pt also states she still has trouble sleeping.  Stopped taking melatonin because she did not think it worked.  Advised Pt that Dr. Audie Box is off this week, but would call back with his recommendation.  In the meantime-she will try taking the torsemide EVERY day to see if her breathing improves.

## 2021-02-05 NOTE — Telephone Encounter (Signed)
Called patient, advised of message from MD.  Patient verbalized understanding . Thankful for call back.

## 2021-02-12 ENCOUNTER — Other Ambulatory Visit: Payer: Self-pay | Admitting: Cardiovascular Disease

## 2021-02-14 NOTE — Progress Notes (Signed)
Cardiology Office Note   Date:  02/15/2021   ID:  Whitney Burton, DOB 1933-04-02, MRN [id number]  PCP:  Donald Prose, MD  Cardiologist:  Dr. Audie Box  CC: Dyspnea   History of Present Illness: Whitney Burton is a 87 y.o. female who presents for ongoing assessment and management of HFpEF, atrial fibrillation on Eliquis 5 mg twice daily, and hypertension.  She was last seen in the office by Dr. Audie Box on 09/25/2020.  He noted that she continues to have shortness of breath, but is not very active.  She resides in an independent living facility.  In the past the patient had been on other antihypertensive but she was intolerant to them.    On the office evaluation her weight had gone up 3 pounds but she did not appear to be volume overloaded.  She was to continue taking torsemide every other day as directed.  She was to continue to take diltiazem extended release 240 mg daily and nebivolol.  It was noted that her blood pressure was slightly elevated.  She was asked to keep a blood pressure log to see what her blood pressure runs when she is not taking her torsemide on those days.  She is to return to the office with this log.  On 02/04/2021 she called her office with complaints of shortness of breath especially when she is up moving around.  She reported that she takes her torsemide every day except for Mondays and Wednesdays because she goes to assisted living meetings on those days.  She was not checking her blood pressure as she she became frustrated with high numbers.  She also states that she has been gaining weight.  She also complained of trouble sleeping.  She was advised to take torsemide every day to assist with her symptoms.  She is here today for follow-up concerning her response to medication changes.   She reports improvement in overall symptoms.  Her breathing status is much better.  She states in the past she was okay in the morning but by the afternoon she was exhausted because she was unable to  breathe well and would often have to stop and rest for the afternoon.  Now that she is increased her torsemide dose she is able to continue her activities well into the afternoon.  She states she still gets tired by the end of the day but is overall improving interactivity.  She denies any cramping, she denies any near-syncope or dizziness.  She denies any melena or epistaxis. her main complaint now is just chronic dependent edema.  I am so excited for her she is you  Past Medical History:  Diagnosis Date   Arrhythmia    Hypertension     Past Surgical History:  Procedure Laterality Date   HEMORRHOID SURGERY     PARTIAL HYSTERECTOMY       Current Outpatient Medications  Medication Sig Dispense Refill   acetaminophen (TYLENOL) 500 MG tablet 1 tablet as needed     apixaban (ELIQUIS) 5 MG TABS tablet Take 1 tablet (5 mg total) by mouth 2 (two) times daily. 180 tablet 1   Cholecalciferol (VITAMIN D3) 25 MCG (1000 UT) CAPS Take by mouth. Take 1000 IU daily     losartan (COZAAR) 100 MG tablet TAKE 1 TABLET BY MOUTH EVERY DAY 90 tablet 1   Nebivolol HCl 20 MG TABS Take 1 tablet (20 mg total) by mouth 2 (two) times daily. 180 tablet 3   TIADYLT ER 240 MG 24  hr capsule TAKE 1 CAPSULE BY MOUTH EVERY DAY 90 capsule 1   No current facility-administered medications for this visit.    Allergies:   Furosemide    Social History:  The patient  reports that she has quit smoking. She has never used smokeless tobacco. She reports current alcohol use.   Family History:  The patient's family history includes Heart attack in her father.    ROS: All other systems are reviewed and negative. Unless otherwise mentioned in H&P    PHYSICAL EXAM: VS:  BP 137/85   Pulse 86   Resp 20   Ht 5' (1.524 m)   Wt 165 lb (74.8 kg)   SpO2 95%   BMI 32.22 kg/m  , BMI Body mass index is 32.22 kg/m. GEN: Well nourished, well developed, in no acute distress HEENT: normal Neck: no JVD, carotid bruits, or  masses Cardiac: IRRR; no murmurs, rubs, or gallops, mild dependent edema  Respiratory:  Clear to auscultation bilaterally, normal work of breathing GI: soft, nontender, nondistended, + BS MS: no deformity or atrophy Skin: warm and dry, no rash Neuro:  Strength and sensation are intact Psych: euthymic mood, full affect   EKG:  EKG is ordered today. 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  (Personally reviewed), compared to previous EKG on 06/27/2020 no changes.   Recent Labs: No results found for requested labs within last 8760 hours.    Lipid Panel No results found for: CHOL, TRIG, HDL, CHOLHDL, VLDL, LDLCALC, LDLDIRECT    Wt Readings from Last 3 Encounters:  02/15/21 165 lb (74.8 kg)  09/25/20 166 lb 3.2 oz (75.4 kg)  06/27/20 163 lb 9.6 oz (74.2 kg)      Other studies Reviewed: Echocardiogram 03-29-19 1. The left ventricle has normal systolic function with an ejection  fraction of 60-65%. The cavity size was normal. Left ventricular diastolic  function could not be evaluated secondary to atrial fibrillation. No  evidence of left ventricular regional  wall motion abnormalities.   2. Left atrial size was moderately dilated.   3. Right atrial size was mildly dilated.   4. Small pericardial effusion.   5. The pericardial effusion is circumferential.   6. Tricuspid valve regurgitation is mild-moderate.   7. The aortic valve is tricuspid. No stenosis of the aortic valve.   8. The aorta is normal unless otherwise noted.   9. The aortic root, ascending aorta and aortic arch are normal in size  and structure.  10. The interatrial septum was not well visualized.   ASSESSMENT AND PLAN:  1.  Persistent atrial fibrillation: Heart rate is well controlled on current medication regimen to include diltiazem and  nebivolol.  She does have some mild dependent edema, which I explained to her can be related to the calcium channel blocker.  No changes in her medication regimen with Eliquis 5 mg twice daily as well.  2.  Chronic dyspnea on exertion: This is markedly improved with increased dose of torsemide to 20 mg daily instead of every other day.  She is able to be more active in the afternoon which is an improvement compared to prior activities when she was on torsemide every other day.  I will check a BMET to evaluate kidney function and potassium level.  She is advised on avoiding salt and also now that she is feeling better  to become more active as I believe that some of this is deconditioning.  In the facility where she lives there are classes for low-level exercise.  I encouraged her to join.  3.  Chronic diastolic CHF: She appears euvolemic today would continue torsemide 20 mg daily new prescription is provided.  Current medicines are reviewed at length with the patient today.  I have spent 25 min's  dedicated to the care of this patient on the date of this encounter to include pre-visit review of records, assessment, management and diagnostic testing,with shared decision making.  Labs/ tests ordered today include: BMET Phill Myron. West Pugh, ANP, [facility]   02/15/2021 8:35 AM    [facility] [place] [address] Office [phone] Fax [phone]  Notice: This dictation was prepared with Dragon dictation along with smaller phrase technology. Any transcriptional errors that result from this process are unintentional and may not be corrected upon review.

## 2021-02-15 ENCOUNTER — Ambulatory Visit: Payer: Medicare Other | Admitting: Adult Health

## 2021-02-15 ENCOUNTER — Encounter: Payer: Self-pay | Admitting: Adult Health

## 2021-02-15 ENCOUNTER — Other Ambulatory Visit: Payer: Self-pay

## 2021-02-15 VITALS — BP 137/85 | HR 86 | Resp 20 | Ht 60.0 in | Wt 165.0 lb

## 2021-02-15 DIAGNOSIS — I5032 Chronic diastolic (congestive) heart failure: Secondary | ICD-10-CM

## 2021-02-15 DIAGNOSIS — Z79899 Other long term (current) drug therapy: Secondary | ICD-10-CM | POA: Diagnosis not present

## 2021-02-15 DIAGNOSIS — R0602 Shortness of breath: Secondary | ICD-10-CM

## 2021-02-15 DIAGNOSIS — I4821 Permanent atrial fibrillation: Secondary | ICD-10-CM | POA: Diagnosis not present

## 2021-02-15 LAB — BASIC METABOLIC PANEL
BUN/Creatinine Ratio: 23 (ref 12–28)
BUN: 17 mg/dL (ref 8–27)
CO2: 26 mmol/L (ref 20–29)
Calcium: 10.1 mg/dL (ref 8.7–10.3)
Chloride: 98 mmol/L (ref 96–106)
Creatinine, Ser: 0.75 mg/dL (ref 0.57–1.00)
Glucose: 92 mg/dL (ref 65–99)
Potassium: 4.6 mmol/L (ref 3.5–5.2)
Sodium: 137 mmol/L (ref 134–144)
eGFR: 78 mL/min/{1.73_m2} (ref >59)

## 2021-02-15 MED ORDER — NEBIVOLOL HCL 20 MG PO TABS: 1.0000 | ORAL_TABLET | Freq: Two times a day (BID) | ORAL | 3 refills | Status: DC

## 2021-02-15 MED ORDER — TORSEMIDE 20 MG PO TABS: 20.0000 mg | ORAL_TABLET | Freq: Two times a day (BID) | ORAL | 3 refills | Status: DC

## 2021-02-15 MED ORDER — APIXABAN 5 MG PO TABS: 5.0000 mg | ORAL_TABLET | Freq: Two times a day (BID) | ORAL | 1 refills | Status: DC

## 2021-02-15 MED ORDER — LOSARTAN POTASSIUM 100 MG PO TABS: ORAL_TABLET | ORAL | 1 refills | Status: DC

## 2021-02-15 NOTE — Patient Instructions (Signed)
Medication Instructions:  No Changes *If you need a refill on your cardiac medications before your next appointment, please call your pharmacy*   Lab Work: BMET : Today If you have labs (blood work) drawn today and your tests are completely normal, you will receive your results only by: [place] (if you have MyChart) OR A paper copy in the mail If you have any lab test that is abnormal or we need to change your treatment, we will call you to review the results.   Testing/Procedures: No Testing    Follow-Up: At [facility], you and your health needs are our priority.  As part of our continuing mission to provide you with exceptional heart care, we have created designated Provider Care Teams.  These Care Teams include your primary Cardiologist (physician) and Advanced Practice Providers (APPs -  Physician Assistants and Nurse Practitioners) who all work together to provide you with the care you need, when you need it.  We recommend signing up for the patient portal called "MyChart".  Sign up information is provided on this After Visit Summary.  MyChart is used to connect with patients for Virtual Visits (Telemedicine).  Patients are able to view lab/test results, encounter notes, upcoming appointments, etc.  Non-urgent messages can be sent to your provider as well.   To learn more about what you can do with MyChart, go to [email or web address].    Your next appointment:   4 month(s)  The format for your next appointment:   In Person  Provider:   Eleonore Chiquito, MD

## 2021-02-19 ENCOUNTER — Telehealth: Payer: Self-pay

## 2021-02-19 NOTE — Telephone Encounter (Addendum)
Spoke with patient regarding Blood Test Results----- Message from Lendon Colonel, NP sent at 02/17/2021  3:28 PM EDT ----- I have reviewed her labs. Her kidney function and potassium are normal on the higher dose of torsemide 20 mg daily. Continue this dose and avoid salt. Try to become more active by participating in low level exercise at the residential facility.  Good report, no concerns here.   Curt Bears

## 2021-03-18 DIAGNOSIS — I10 Essential (primary) hypertension: Secondary | ICD-10-CM | POA: Diagnosis not present

## 2021-03-18 DIAGNOSIS — Z1389 Encounter for screening for other disorder: Secondary | ICD-10-CM | POA: Diagnosis not present

## 2021-03-18 DIAGNOSIS — D6869 Other thrombophilia: Secondary | ICD-10-CM | POA: Diagnosis not present

## 2021-03-18 DIAGNOSIS — Z0000 Encounter for general adult medical examination without abnormal findings: Secondary | ICD-10-CM | POA: Diagnosis not present

## 2021-03-18 DIAGNOSIS — I4891 Unspecified atrial fibrillation: Secondary | ICD-10-CM | POA: Diagnosis not present

## 2021-04-09 ENCOUNTER — Other Ambulatory Visit: Payer: Self-pay | Admitting: Cardiovascular Disease

## 2021-04-09 DIAGNOSIS — Z1231 Encounter for screening mammogram for malignant neoplasm of breast: Secondary | ICD-10-CM | POA: Diagnosis not present

## 2021-05-14 DIAGNOSIS — H5203 Hypermetropia, bilateral: Secondary | ICD-10-CM | POA: Diagnosis not present

## 2021-05-14 DIAGNOSIS — H2513 Age-related nuclear cataract, bilateral: Secondary | ICD-10-CM | POA: Diagnosis not present

## 2021-05-14 DIAGNOSIS — H40023 Open angle with borderline findings, high risk, bilateral: Secondary | ICD-10-CM | POA: Diagnosis not present

## 2021-07-04 DIAGNOSIS — H2513 Age-related nuclear cataract, bilateral: Secondary | ICD-10-CM | POA: Diagnosis not present

## 2021-07-21 NOTE — Progress Notes (Signed)
Cardiology Office Note:   Date:  07/23/2021  NAME:  Whitney Burton    MRN: [id number] DOB:  09/11/1932   PCP:  Donald Prose, MD  Cardiologist:  Evalina Field, MD  Electrophysiologist:  None   Referring MD: Donald Prose, MD   Chief Complaint  Patient presents with   Follow-up         History of Present Illness:   Whitney Burton is a 88 y.o. female with a hx of permanent Afib, HTN, HFpEF who presents for follow-up.  She reports she is still getting short of breath with activity.  Weights seem to be somewhat stable.  BP controlled.  She denies any chest pain.  She is still living in independent living.  She reports walking the halls gets her quite winded.  She is not that active either.  She is still in A-fib.  Rates are well controlled.  Lungs largely clear.  She does have some diminished breath sounds at the lung bases.  She has no evidence of gross volume overload.  We discussed repeating a chest x-ray as well as echocardiogram.  We will also add Aldactone to help with fluid.  Problem List 1. Permanent Atrial Fibrillation  -diagnosed 2019 [facility] -failed DCCV and did not want to do rhythm medications  -CHADSVASC= 4 (age, sex, HTN) 2. Hypertension 3. HFpEF  -BNP 451 -EF 60-65%  Past Medical History: Past Medical History:  Diagnosis Date   Arrhythmia    Hypertension     Past Surgical History: Past Surgical History:  Procedure Laterality Date   HEMORRHOID SURGERY     PARTIAL HYSTERECTOMY      Current Medications: Current Meds  Medication Sig   acetaminophen (TYLENOL) 500 MG tablet 1 tablet as needed   apixaban (ELIQUIS) 5 MG TABS tablet Take 1 tablet (5 mg total) by mouth 2 (two) times daily.   Cholecalciferol (VITAMIN D3) 25 MCG (1000 UT) CAPS Take by mouth. Take 1000 IU daily   losartan (COZAAR) 100 MG tablet TAKE 1 TABLET BY MOUTH EVERY DAY   Nebivolol HCl 20 MG TABS Take 1 tablet (20 mg total) by mouth 2 (two) times daily.   spironolactone (ALDACTONE) 25 MG tablet  Take 0.5 tablets (12.5 mg total) by mouth daily.   TIADYLT ER 240 MG 24 hr capsule TAKE 1 CAPSULE BY MOUTH EVERY DAY   [DISCONTINUED] torsemide (DEMADEX) 20 MG tablet Take 1 tablet (20 mg total) by mouth 2 (two) times daily.     Allergies:    Furosemide   Social History: Social History   Socioeconomic History   Marital status: Widowed    Spouse name: Not on file   Number of children: Not on file   Years of education: Not on file   Highest education level: Not on file  Occupational History   Not on file  Tobacco Use   Smoking status: Former   Smokeless tobacco: Never  Substance and Sexual Activity   Alcohol use: Yes   Drug use: Not on file   Sexual activity: Not on file  Other Topics Concern   Not on file  Social History Narrative   Widowed. Moved to [place] to be closer to daughter.    Social Determinants of Health   Financial Resource Strain: Not on file  Food Insecurity: Not on file  Transportation Needs: Not on file  Physical Activity: Not on file  Stress: Not on file  Social Connections: Not on file     Family History:  The patient's family history includes Heart attack in her father.  ROS:   All other ROS reviewed and negative. Pertinent positives noted in the HPI.     EKGs/Labs/Other Studies Reviewed:   The following studies were personally reviewed by me today:   TTE 02/28/2019  1. The left ventricle has normal systolic function with an ejection  fraction of 60-65%. The cavity size was normal. Left ventricular diastolic  function could not be evaluated secondary to atrial fibrillation. No  evidence of left ventricular regional  wall motion abnormalities.   2. Left atrial size was moderately dilated.   3. Right atrial size was mildly dilated.   4. Small pericardial effusion.   5. The pericardial effusion is circumferential.   6. Tricuspid valve regurgitation is mild-moderate.   7. The aortic valve is tricuspid. No stenosis of the aortic valve.    8. The aorta is normal unless otherwise noted.   9. The aortic root, ascending aorta and aortic arch are normal in size  and structure.  10. The interatrial septum was not well visualized.  Recent Labs: 02/15/2021: BUN 17; Creatinine, Ser 0.75; Potassium 4.6; Sodium 137   Recent Lipid Panel No results found for: CHOL, TRIG, HDL, CHOLHDL, VLDL, LDLCALC, LDLDIRECT  Physical Exam:   VS:  BP 140/86 (BP Location: Left Arm, Patient Position: Sitting, Cuff Size: Large)    Pulse 70    Ht 5' (1.524 m)    Wt 168 lb (76.2 kg)    SpO2 98%    BMI 32.81 kg/m    Wt Readings from Last 3 Encounters:  07/23/21 168 lb (76.2 kg)  02/15/21 165 lb (74.8 kg)  09/25/20 166 lb 3.2 oz (75.4 kg)    General: Well nourished, well developed, in no acute distress Head: Atraumatic, normal size  Eyes: PEERLA, EOMI  Neck: Supple, no JVD Endocrine: No thryomegaly Cardiac: Normal S1, S2; irregular rhythm Lungs: Diminished breath sounds at the lung bases Abd: Soft, nontender, no hepatomegaly  Ext: No edema, pulses 2+ Musculoskeletal: No deformities, BUE and BLE strength normal and equal Skin: Warm and dry, no rashes   Neuro: Alert and oriented to person, place, time, and situation, CNII-XII grossly intact, no focal deficits  Psych: Normal mood and affect   ASSESSMENT:   Whitney Burton is a 88 y.o. female who presents for the following: 1. SOB (shortness of breath)   2. Permanent atrial fibrillation ([place])   3. Acquired thrombophilia ([place])   4. Chronic diastolic heart failure ([place])   5. Essential hypertension     PLAN:   1. SOB (shortness of breath) -She presents with worsening shortness of breath.  Weights are slightly up.  She appears largely euvolemic on exam.  She is continued on torsemide 20 mg daily.  Exams with diminished breath sounds at the lung bases.  No evidence of gross volume overload.  I would like to check a chest x-ray.  Need to make sure were not missing pneumonia or something else.  She describes  no fevers or chills.  Would also like to recheck an echocardiogram.  I hear no significant murmurs but she has had atrial fibrillation for years.  She could be developing other issues.  We will also get a better idea of filling pressures on echo.  2. Permanent atrial fibrillation ([place]) 3. Acquired thrombophilia ([place]) -Rate controlled on nebivolol and diltiazem.  We will continue this current regimen.  She is on Eliquis 5 mg twice daily.  No bleeding issues.  4.  Chronic diastolic heart failure ([facility]) -Largely euvolemic on exam.  Repeat echo and chest x-ray.  We will add Aldactone 12.5 mg daily.  This should help with volume.  She will continue torsemide 20 mg daily.  She can take extra tablet as needed.  Further instruction pending x-ray and echo.  5. Essential hypertension -Well-controlled.  Adding Aldactone today.  Disposition: Return in about 4 months (around 11/20/2021).  Medication Adjustments/Labs and Tests Ordered: Current medicines are reviewed at length with the patient today.  Concerns regarding medicines are outlined above.  Orders Placed This Encounter  Procedures   DG Chest 2 View   ECHOCARDIOGRAM COMPLETE   Meds ordered this encounter  Medications   spironolactone (ALDACTONE) 25 MG tablet    Sig: Take 0.5 tablets (12.5 mg total) by mouth daily.    Dispense:  45 tablet    Refill:  3   torsemide (DEMADEX) 20 MG tablet    Sig: Take 1 tablet (20 mg total) by mouth daily.    Dispense:  90 tablet    Refill:  3    Patient Instructions  Medication Instructions:  START Aldactone 12.5 mg daily   CHANGE Torsemide 20 mg daily (take extra tablets as needed)   *If you need a refill on your cardiac medications before your next appointment, please call your pharmacy*  Testing/Procedures: Echocardiogram - Your physician has requested that you have an echocardiogram. Echocardiography is a painless test that uses sound waves to create images of your heart. It provides your doctor  with information about the size and shape of your heart and how well your hearts chambers and valves are working. This procedure takes approximately one hour. There are no restrictions for this procedure. This will be performed at either our [facility] location - [address], [place] location [facility] 2nd floor.  Chest xray - Your physician has requested that you have a chest xray, is a fast and painless imaging test that uses certain electromagnetic waves to create pictures of the structures in and around your chest. This test can help diagnose and monitor conditions such as pneumonia and other lung issues his will be done at [facility], [place]. If you should need to call them their phone number is [phone].   Follow-Up: At [facility], you and your health needs are our priority.  As part of our continuing mission to provide you with exceptional heart care, we have created designated Provider Care Teams.  These Care Teams include your primary Cardiologist (physician) and Advanced Practice Providers (APPs -  Physician Assistants and Nurse Practitioners) who all work together to provide you with the care you need, when you need it.  We recommend signing up for the patient portal called "MyChart".  Sign up information is provided on this After Visit Summary.  MyChart is used to connect with patients for Virtual Visits (Telemedicine).  Patients are able to view lab/test results, encounter notes, upcoming appointments, etc.  Non-urgent messages can be sent to your provider as well.   To learn more about what you can do with MyChart, go to [email or web address].    Your next appointment:   4 month(s)  The format for your next appointment:   In Person  Provider:   Evalina Field, MD       Time Spent with Patient: I have spent a total of 35 minutes with patient reviewing hospital notes, telemetry, EKGs, labs and examining the  patient as well as establishing an assessment and plan that was discussed with the patient.  > 50% of time was spent in direct patient care.  Signed, Addison Naegeli. Audie Box, MD, [facility]  [address], [place], [place] [address] [phone]  07/23/2021 11:32 AM

## 2021-07-23 ENCOUNTER — Encounter: Payer: Self-pay | Admitting: Cardiovascular Disease

## 2021-07-23 ENCOUNTER — Ambulatory Visit: Payer: Medicare Other | Admitting: Cardiovascular Disease

## 2021-07-23 ENCOUNTER — Other Ambulatory Visit: Payer: Self-pay

## 2021-07-23 VITALS — BP 140/86 | HR 70 | Ht 60.0 in | Wt 168.0 lb

## 2021-07-23 DIAGNOSIS — R0602 Shortness of breath: Secondary | ICD-10-CM

## 2021-07-23 DIAGNOSIS — I5032 Chronic diastolic (congestive) heart failure: Secondary | ICD-10-CM | POA: Diagnosis not present

## 2021-07-23 DIAGNOSIS — I4821 Permanent atrial fibrillation: Secondary | ICD-10-CM

## 2021-07-23 DIAGNOSIS — D6869 Other thrombophilia: Secondary | ICD-10-CM | POA: Diagnosis not present

## 2021-07-23 DIAGNOSIS — I10 Essential (primary) hypertension: Secondary | ICD-10-CM | POA: Diagnosis not present

## 2021-07-23 MED ORDER — TORSEMIDE 20 MG PO TABS: 20.0000 mg | ORAL_TABLET | Freq: Every day | ORAL | 3 refills | Status: DC

## 2021-07-23 MED ORDER — SPIRONOLACTONE 25 MG PO TABS: 12.5000 mg | ORAL_TABLET | Freq: Every day | ORAL | 3 refills | Status: DC

## 2021-07-23 NOTE — Patient Instructions (Addendum)
Medication Instructions:  START Aldactone 12.5 mg daily   CHANGE Torsemide 20 mg daily (take extra tablets as needed)   *If you need a refill on your cardiac medications before your next appointment, please call your pharmacy*  Testing/Procedures: Echocardiogram - Your physician has requested that you have an echocardiogram. Echocardiography is a painless test that uses sound waves to create images of your heart. It provides your doctor with information about the size and shape of your heart and how well your hearts chambers and valves are working. This procedure takes approximately one hour. There are no restrictions for this procedure. This will be performed at either our [facility] location - [address] [place] location [facility] 2nd floor.  Chest xray - Your physician has requested that you have a chest xray, is a fast and painless imaging test that uses certain electromagnetic waves to create pictures of the structures in and around your chest. This test can help diagnose and monitor conditions such as pneumonia and other lung issues his will be done at [facility], [place]. If you should need to call them their phone number is [phone].   Follow-Up: At [facility], you and your health needs are our priority.  As part of our continuing mission to provide you with exceptional heart care, we have created designated Provider Care Teams.  These Care Teams include your primary Cardiologist (physician) and Advanced Practice Providers (APPs -  Physician Assistants and Nurse Practitioners) who all work together to provide you with the care you need, when you need it.  We recommend signing up for the patient portal called "MyChart".  Sign up information is provided on this After Visit Summary.  MyChart is used to connect with patients for Virtual Visits (Telemedicine).  Patients are able to view lab/test results, encounter notes,  upcoming appointments, etc.  Non-urgent messages can be sent to your provider as well.   To learn more about what you can do with MyChart, go to [email or web address].    Your next appointment:   4 month(s)  The format for your next appointment:   In Person  Provider:   Evalina Field, MD

## 2021-07-25 ENCOUNTER — Ambulatory Visit
Admission: RE | Admit: 2021-07-25 | Discharge: 2021-07-25 | Disposition: A | Discharge status: Home / Self Care | Payer: Medicare Other | Source: Ambulatory Visit | Attending: Cardiovascular Disease | Admitting: Cardiovascular Disease

## 2021-07-25 DIAGNOSIS — R0602 Shortness of breath: Secondary | ICD-10-CM

## 2021-07-25 DIAGNOSIS — R059 Cough, unspecified: Secondary | ICD-10-CM | POA: Diagnosis not present

## 2021-08-08 ENCOUNTER — Other Ambulatory Visit: Payer: Self-pay

## 2021-08-08 ENCOUNTER — Ambulatory Visit (HOSPITAL_COMMUNITY): Payer: Medicare Other | Attending: Cardiology

## 2021-08-08 DIAGNOSIS — R0602 Shortness of breath: Secondary | ICD-10-CM | POA: Diagnosis not present

## 2021-08-08 LAB — ECHOCARDIOGRAM COMPLETE
Area-P 1/2: 4.23 cm2
MV M vel: 5.12 m/s
MV Peak grad: 104.9 mmHg
P 1/2 time: 327 msec
S' Lateral: 3.4 cm

## 2021-08-13 ENCOUNTER — Telehealth: Payer: Self-pay | Admitting: Cardiovascular Disease

## 2021-08-13 NOTE — Telephone Encounter (Signed)
See result note.  

## 2021-08-13 NOTE — Telephone Encounter (Signed)
Patient is calling requesting her Echo results

## 2021-08-28 DIAGNOSIS — H2511 Age-related nuclear cataract, right eye: Secondary | ICD-10-CM | POA: Diagnosis not present

## 2021-09-02 ENCOUNTER — Telehealth: Payer: Self-pay | Admitting: Cardiovascular Disease

## 2021-09-02 NOTE — Telephone Encounter (Signed)
Patient said at 4:30 am she got up to go to the bathroom and noticed rectal bleeding. She called the on call provider and was told to hold her eliquis and Call Dr. Audie Box on Monday  ?

## 2021-09-02 NOTE — Telephone Encounter (Signed)
Spoke to patient she stated she had cataract surgery last Wednesday.Stated she woke up early this past Saturday morning and notice bight red blood in stool.She called Dr.on call and was advised to hold Eliquis.Stated yesterday her stool was dark brown.Today her stool appeared normal brown.She wanted know if ok to restart Eliquis.Advised ok to restart Eliquis.She wanted to make sure Dr.O'Neal agreed.I will send message to him. ?

## 2021-09-03 NOTE — Telephone Encounter (Signed)
Spoke to patient Dr.ONeal's advice given.Stated she will call PCP this morning and schedule appointment. ?

## 2021-09-04 DIAGNOSIS — K625 Hemorrhage of anus and rectum: Secondary | ICD-10-CM | POA: Diagnosis not present

## 2021-09-05 ENCOUNTER — Telehealth: Payer: Self-pay | Admitting: Cardiovascular Disease

## 2021-09-05 NOTE — Telephone Encounter (Signed)
Patient would like to know when she can start taking her Eliquis again.  ?

## 2021-09-05 NOTE — Telephone Encounter (Signed)
Called patient, advised of message below. Patient verbalized understanding.  

## 2021-09-05 NOTE — Telephone Encounter (Signed)
Spoke to patient . Patient states she saw  Dr Nancy Fetter yesterday . Dr Nancy Fetter  did some blood work and recommend  patient restart Eliquis .  Patient states Dr Nancy Fetter thought it may be hemorrhoids and if bleeding  restart will get it evaluate by "another Doctor" ?  Patient wanted to know from Dr Jenetta DownerNori Riis if she restart since he was the one to stop talking medication. ? RN informed patient will defer to Dr Jenetta DownerNori Riis and call her back  ?Patient verbalized understanding. ?

## 2021-09-24 ENCOUNTER — Other Ambulatory Visit: Payer: Self-pay | Admitting: Adult Health

## 2021-09-24 DIAGNOSIS — I4821 Permanent atrial fibrillation: Secondary | ICD-10-CM

## 2021-09-24 NOTE — Telephone Encounter (Signed)
Prescription refill request for Eliquis received. ?Indication:Afib  ?Last office visit:07/23/21 ([facility]) ?Scr: 0.75 (02/15/21) ?Age: 88 ?Weight: 76.2kg ? ?Appropriate dose and refill sent to requested pharmacy.  ?

## 2021-10-12 ENCOUNTER — Other Ambulatory Visit: Payer: Self-pay | Admitting: Adult Health

## 2021-11-08 ENCOUNTER — Telehealth: Payer: Self-pay | Admitting: Cardiovascular Disease

## 2021-11-08 DIAGNOSIS — Z79899 Other long term (current) drug therapy: Secondary | ICD-10-CM

## 2021-11-08 DIAGNOSIS — R040 Epistaxis: Secondary | ICD-10-CM

## 2021-11-08 NOTE — Telephone Encounter (Signed)
The patient stated she has not had a nose bleed for ~3 years, before she moved here.  Just wanted to make the doctor aware. Takes about 15 minutes to get the bleeding to stop. Seems like the bleeding is coming from her right side with both bleeding episodes. Noted she has had this area cauterized previously with she lived in [place] head when she saw ENT for this same problem. Noted upcoming appt on 6/8. Educated on preventing and treating nose bleeds. Will forward to Dr. Audie Box for advisement.

## 2021-11-08 NOTE — Telephone Encounter (Signed)
Pt c/o medication issue:  1. Name of Medication: Eliquis  2. How are you currently taking this medication (dosage and times per day)? 2 times a day  3. Are you having a reaction (difficulty breathing--STAT)? no  4. What is your medication issue? Nosebleed  today and Wednesday- not bleeding at this time

## 2021-11-08 NOTE — Telephone Encounter (Signed)
Called  spoke patient to clarify  if patient was still having a nose bleed.  Patient states she only bleed for about 15 mins.   Patient states she just moved to the area. She was not aware of ENT office .   RN will send message to Dr Audie Box  for clarification  How long to hold  anticoagulation.- should it be until appt 6/8 23 or appt with ENT?

## 2021-11-08 NOTE — Telephone Encounter (Signed)
IF bleeding stopped, can continue eliquis. If comes back, would hold until evaluated by ENT.   [place] Bells T. Audie Box, MD, [place]  [address] [place]  [place], [place] [address]  [phone]  4:46 PM

## 2021-11-08 NOTE — Telephone Encounter (Signed)
Would hold anticoagulation and get evaluated by ENT.   L[place] Bells T. Audie Box, MD, [place]  [address] [place]  [place], [place] [address]  [phone]  3:30 PM

## 2021-11-08 NOTE — Telephone Encounter (Signed)
Called left a message  - on patient secure voicemail.   Informed patient a referral  for ENT was ordered.   Continue taking Eliquis if bleeding reoccurs stop until she sees ENT  May call the on-call provider if needed.

## 2021-11-13 DIAGNOSIS — H2512 Age-related nuclear cataract, left eye: Secondary | ICD-10-CM | POA: Diagnosis not present

## 2021-11-13 DIAGNOSIS — H25812 Combined forms of age-related cataract, left eye: Secondary | ICD-10-CM | POA: Diagnosis not present

## 2021-11-20 NOTE — Progress Notes (Signed)
Cardiology Office Note:   Date:  11/21/2021  NAME:  Whitney Burton    MRN: [id number] DOB:  12/03/1932   PCP:  Donald Prose, MD  Cardiologist:  Evalina Field, MD  Electrophysiologist:  None   Referring MD: Donald Prose, MD   Chief Complaint  Patient presents with   Follow-up        History of Present Illness:   Whitney Burton is a 88 y.o. female with a hx of permanent Afib, HTN, HFpEF who presents for follow-up.  She presents for follow-up.  Blood pressure is severely elevated at 190/101.  She denies any chest pain or trouble breathing.  No dizziness or lightheadedness.  She is still getting short of breath with activity.  Most recent echo shows normal LV function and normal heart pressures.  No evidence of volume overload.  She is euvolemic today on exam.  She reports she is not active.  She is severely short of breath with any activity.  We discussed working with physical therapy.  I believe activity would help her.  She is trying to avoid salt.  Her weights are stable.  Echo is stable.  She was reporting nosebleeds.  They have resolved.  She is gone back on Eliquis.  No further issues.  Awaiting to see ENT.  Problem List 1. Permanent Atrial Fibrillation  -diagnosed 2019 [facility] [place] [facility] -failed DCCV and did not want to do rhythm medications  -CHADSVASC= 4 (age, sex, HTN) 2. Hypertension 3. HFpEF  -BNP 451 -EF 60-65%  Past Medical History: Past Medical History:  Diagnosis Date   Arrhythmia    Hypertension     Past Surgical History: Past Surgical History:  Procedure Laterality Date   HEMORRHOID SURGERY     PARTIAL HYSTERECTOMY      Current Medications: Current Meds  Medication Sig   acetaminophen (TYLENOL) 500 MG tablet 1 tablet as needed   Cholecalciferol (VITAMIN D3) 25 MCG (1000 UT) CAPS Take by mouth. Take 1000 IU daily   ELIQUIS 5 MG TABS tablet TAKE 1 TABLET BY MOUTH TWICE A DAY   losartan (COZAAR) 100 MG tablet TAKE 1 TABLET BY MOUTH EVERY DAY   Nebivolol HCl 20 MG TABS  Take 1 tablet (20 mg total) by mouth 2 (two) times daily.   prednisoLONE acetate (PRED FORTE) 1 % ophthalmic suspension Place 1 drop into the left eye 4 (four) times daily.   spironolactone (ALDACTONE) 25 MG tablet Take 0.5 tablets (12.5 mg total) by mouth daily.   TIADYLT ER 240 MG 24 hr capsule TAKE 1 CAPSULE BY MOUTH EVERY DAY   torsemide (DEMADEX) 20 MG tablet Take 1 tablet (20 mg total) by mouth daily.     Allergies:    Ciprofloxacin hcl, Levofloxacin, Ondansetron hcl, and Furosemide   Social History: Social History   Socioeconomic History   Marital status: Widowed    Spouse name: Not on file   Number of children: Not on file   Years of education: Not on file   Highest education level: Not on file  Occupational History   Not on file  Tobacco Use   Smoking status: Former   Smokeless tobacco: Never  Substance and Sexual Activity   Alcohol use: Yes   Drug use: Not on file   Sexual activity: Not on file  Other Topics Concern   Not on file  Social History Narrative   Widowed. Moved to [place] to be closer to daughter.    Social Determinants of Health  Financial Resource Strain: Not on file  Food Insecurity: Not on file  Transportation Needs: Not on file  Physical Activity: Not on file  Stress: Not on file  Social Connections: Not on file     Family History: The patient's family history includes Heart attack in her father.  ROS:   All other ROS reviewed and negative. Pertinent positives noted in the HPI.     EKGs/Labs/Other Studies Reviewed:   The following studies were personally reviewed by me today:   TTE 08/08/2021  1. Left ventricular ejection fraction, by estimation, is 55 to 60%. The  left ventricle has normal function. The left ventricle has no regional  wall motion abnormalities. Left ventricular diastolic parameters are  consistent with Grade III diastolic  dysfunction (restrictive). Elevated left atrial pressure.   2. Right ventricular systolic  function is normal. The right ventricular  size is mildly enlarged. There is normal pulmonary artery systolic  pressure.   3. Left atrial size was severely dilated.   4. Right atrial size was severely dilated.   5. The mitral valve is normal in structure. Mild mitral valve  regurgitation. No evidence of mitral stenosis.   6. Tricuspid valve regurgitation is moderate.   7. The aortic valve is normal in structure. Aortic valve regurgitation is  not visualized. No aortic stenosis is present.   8. The inferior vena cava is normal in size with greater than 50%  respiratory variability, suggesting right atrial pressure of 3 mmHg.   Recent Labs: 02/15/2021: BUN 17; Creatinine, Ser 0.75; Potassium 4.6; Sodium 137   Recent Lipid Panel No results found for: "CHOL", "TRIG", "HDL", "CHOLHDL", "VLDL", "LDLCALC", "LDLDIRECT"  Physical Exam:   VS:  BP (!) 190/101   Pulse 78   Ht 5' (1.524 m)   Wt 166 lb 12.8 oz (75.7 kg)   SpO2 98%   BMI 32.58 kg/m    Wt Readings from Last 3 Encounters:  11/21/21 166 lb 12.8 oz (75.7 kg)  07/23/21 168 lb (76.2 kg)  02/15/21 165 lb (74.8 kg)    General: Well nourished, well developed, in no acute distress Head: Atraumatic, normal size  Eyes: PEERLA, EOMI  Neck: Supple, no JVD Endocrine: No thryomegaly Cardiac: Normal S1, S2; irregular rhythm, no murmurs rubs or gallops Lungs: Clear to auscultation bilaterally, no wheezing, rhonchi or rales  Abd: Soft, nontender, no hepatomegaly  Ext: No edema, pulses 2+ Musculoskeletal: No deformities, BUE and BLE strength normal and equal Skin: Warm and dry, no rashes   Neuro: Alert and oriented to person, place, time, and situation, CNII-XII grossly intact, no focal deficits  Psych: Normal mood and affect   ASSESSMENT:   Whitney Burton is a 88 y.o. female who presents for the following: 1. Permanent atrial fibrillation ([facility])   2. Acquired thrombophilia ([facility])   3. Bleeding from the nose   4. Chronic diastolic heart  failure ([facility])   5. Essential hypertension     PLAN:   1. Permanent atrial fibrillation ([facility]) 2. Acquired thrombophilia ([facility]) -Rate controlled on diltiazem to 40 mg daily and nebivolol 20 mg twice daily.  Seems to be stable.  We will continue this regimen.  On Eliquis 5 mg twice daily.  Was having bleeding from her nose but this has resolved.  Okay to go back on Eliquis.  Bleeding was reported to be minimal.  3. Bleeding from the nose -Minimal bleeding that has resolved.  Okay to continue Eliquis.  4. Chronic diastolic heart failure ([facility]) -Euvolemic on  exam.  Currently on torsemide 20 mg daily as well as Aldactone 12.5 mg daily.  No issues with volume overload.  Most recent echo shows normal RVSP.  Collapsing IVC.  I believe her symptoms are likely related to deconditioning.  I recommended she work with physical therapy and become more active.  She will do this.  5. Essential hypertension -Difficult to assess her blood pressure.  Checked in her leg.  190/101.  Values are really out of range for her.  She just took her medication.  I recommended she start to check her blood pressure once per day.  She should also avoid salt consumption and get more active.  She will see me back in 2 weeks for further evaluation.  She will bring a blood pressure log at that appointment.     Disposition: Return in about 2 weeks (around 12/05/2021).  Medication Adjustments/Labs and Tests Ordered: Current medicines are reviewed at length with the patient today.  Concerns regarding medicines are outlined above.  Orders Placed This Encounter  Procedures   Ambulatory referral to Physical Therapy   No orders of the defined types were placed in this encounter.   Patient Instructions  Medication Instructions:  The current medical regimen is effective;  continue present plan and medications.  *If you need a refill on your cardiac medications before your next appointment, please call your  pharmacy*   Follow-Up: At [facility], you and your health needs are our priority.  As part of our continuing mission to provide you with exceptional heart care, we have created designated Provider Care Teams.  These Care Teams include your primary Cardiologist (physician) and Advanced Practice Providers (APPs -  Physician Assistants and Nurse Practitioners) who all work together to provide you with the care you need, when you need it.  We recommend signing up for the patient portal called "MyChart".  Sign up information is provided on this After Visit Summary.  MyChart is used to connect with patients for Virtual Visits (Telemedicine).  Patients are able to view lab/test results, encounter notes, upcoming appointments, etc.  Non-urgent messages can be sent to your provider as well.   To learn more about what you can do with MyChart, go to [email or web address].    Your next appointment:   2 weeks  The format for your next appointment:   In Person  Provider:   Evalina Field, MD     We will notify your facility of the need for PT.  Check your blood pressure daily         Time Spent with Patient: I have spent a total of 35 minutes with patient reviewing hospital notes, telemetry, EKGs, labs and examining the patient as well as establishing an assessment and plan that was discussed with the patient.  > 50% of time was spent in direct patient care.  Signed, Addison Naegeli. Audie Box, MD, [place]  [address] [place], [place] [address] [phone]  11/21/2021 10:30 AM

## 2021-11-21 ENCOUNTER — Encounter: Payer: Self-pay | Admitting: Cardiovascular Disease

## 2021-11-21 ENCOUNTER — Ambulatory Visit (INDEPENDENT_AMBULATORY_CARE_PROVIDER_SITE_OTHER): Payer: Medicare Other | Admitting: Cardiovascular Disease

## 2021-11-21 VITALS — BP 190/101 | HR 78 | Ht 60.0 in | Wt 166.8 lb

## 2021-11-21 DIAGNOSIS — R040 Epistaxis: Secondary | ICD-10-CM

## 2021-11-21 DIAGNOSIS — I10 Essential (primary) hypertension: Secondary | ICD-10-CM | POA: Diagnosis not present

## 2021-11-21 DIAGNOSIS — D6869 Other thrombophilia: Secondary | ICD-10-CM | POA: Diagnosis not present

## 2021-11-21 DIAGNOSIS — I5032 Chronic diastolic (congestive) heart failure: Secondary | ICD-10-CM | POA: Diagnosis not present

## 2021-11-21 DIAGNOSIS — I4821 Permanent atrial fibrillation: Secondary | ICD-10-CM | POA: Diagnosis not present

## 2021-11-21 NOTE — Patient Instructions (Addendum)
Medication Instructions:  The current medical regimen is effective;  continue present plan and medications.  *If you need a refill on your cardiac medications before your next appointment, please call your pharmacy*   Follow-Up: At [facility], you and your health needs are our priority.  As part of our continuing mission to provide you with exceptional heart care, we have created designated Provider Care Teams.  These Care Teams include your primary Cardiologist (physician) and Advanced Practice Providers (APPs -  Physician Assistants and Nurse Practitioners) who all work together to provide you with the care you need, when you need it.  We recommend signing up for the patient portal called "MyChart".  Sign up information is provided on this After Visit Summary.  MyChart is used to connect with patients for Virtual Visits (Telemedicine).  Patients are able to view lab/test results, encounter notes, upcoming appointments, etc.  Non-urgent messages can be sent to your provider as well.   To learn more about what you can do with MyChart, go to [email or web address].    Your next appointment:   2 weeks  The format for your next appointment:   In Person  Provider:   Evalina Field, MD     We will notify your facility of the need for PT.  Check your blood pressure daily

## 2021-12-09 ENCOUNTER — Telehealth: Payer: Self-pay | Admitting: Cardiovascular Disease

## 2021-12-09 NOTE — Progress Notes (Signed)
Cardiology Office Note:   Date:  12/11/2021  NAME:  Whitney Burton    MRN: [id number] DOB:  [birth year]   PCP:  Donald Prose, MD  Cardiologist:  Evalina Field, MD  Electrophysiologist:  None   Referring MD: Donald Prose, MD   Chief Complaint  Patient presents with   Follow-up         History of Present Illness:   Whitney Burton is a 88 y.o. female with a hx of permament Afib, HTN, HFpEF who presents for follow-up. SOB reported. BP elevated.  Blood pressure is much improved.  Values are in the 120s.  EKG shows A-fib heart rate in the 60s.  She reports her pulse has been between 58s and 60s.  We discussed stopping nebivolol.  She actually is down 4 to 5 pounds.  She is euvolemic on exam.  We discussed backing off on torsemide.  She is still short of breath with activity.  She reports she cannot do anything without getting short of breath.  She reports poor sleep, poor appetite and leg jerking.  There is possible depression.  Apparently she is anxious.  She presents with her daughter.  Her primary care physician has tried to put her on anxiety medication in the past.  Recent echo shows no elevated pulmonary pressures.  She actually looks very euvolemic today.  I do not think a cardiac explanation explains this.  She will see her primary doctor today.  I would recommend a full panel of labs to make sure there is no infection and to make sure her thyroid other studies are within limits.  She actually looks very good volume status wise.  Problem List 1. Permanent Atrial Fibrillation  -diagnosed 2019 [facility] -failed DCCV and did not want to do rhythm medications  -CHADSVASC= 4 (age, sex, HTN) 2. Hypertension 3. HFpEF  -BNP 451 -EF 60-65%  Past Medical History: Past Medical History:  Diagnosis Date   Arrhythmia    Hypertension     Past Surgical History: Past Surgical History:  Procedure Laterality Date   HEMORRHOID SURGERY     PARTIAL HYSTERECTOMY      Current Medications: Current  Meds  Medication Sig   acetaminophen (TYLENOL) 500 MG tablet 1 tablet as needed   Cholecalciferol (VITAMIN D3) 25 MCG (1000 UT) CAPS Take by mouth. Take 1000 IU daily   ELIQUIS 5 MG TABS tablet TAKE 1 TABLET BY MOUTH TWICE A DAY   losartan (COZAAR) 100 MG tablet TAKE 1 TABLET BY MOUTH EVERY DAY   moxifloxacin (VIGAMOX) 0.5 % ophthalmic solution Place 1 drop into the left eye 4 (four) times daily.   prednisoLONE acetate (PRED FORTE) 1 % ophthalmic suspension Place 1 drop into the left eye 4 (four) times daily.   spironolactone (ALDACTONE) 25 MG tablet Take 0.5 tablets (12.5 mg total) by mouth daily.   TIADYLT ER 240 MG 24 hr capsule TAKE 1 CAPSULE BY MOUTH EVERY DAY   [DISCONTINUED] Nebivolol HCl 20 MG TABS Take 1 tablet (20 mg total) by mouth 2 (two) times daily.   [DISCONTINUED] torsemide (DEMADEX) 20 MG tablet Take 1 tablet (20 mg total) by mouth daily.     Allergies:    Ciprofloxacin hcl, Levofloxacin, Ondansetron hcl, and Furosemide   Social History: Social History   Socioeconomic History   Marital status: Widowed    Spouse name: Not on file   Number of children: Not on file   Years of education: Not on file   Highest  education level: Not on file  Occupational History   Not on file  Tobacco Use   Smoking status: Former   Smokeless tobacco: Never  Substance and Sexual Activity   Alcohol use: Yes   Drug use: Not on file   Sexual activity: Not on file  Other Topics Concern   Not on file  Social History Narrative   Widowed. Moved to [place] to be closer to daughter.    Social Determinants of Health   Financial Resource Strain: Not on file  Food Insecurity: Not on file  Transportation Needs: Not on file  Physical Activity: Not on file  Stress: Not on file  Social Connections: Not on file     Family History: The patient's family history includes Heart attack in her father.  ROS:   All other ROS reviewed and negative. Pertinent positives noted in the HPI.      EKGs/Labs/Other Studies Reviewed:   The following studies were personally reviewed by me today:  EKG:  EKG is ordered today.  The ekg ordered today demonstrates atrial fibrillation heart rate 65 nonspecific ST-T changes, and was personally reviewed by me.   TTE 08/08/2021  1. Left ventricular ejection fraction, by estimation, is 55 to 60%. The  left ventricle has normal function. The left ventricle has no regional  wall motion abnormalities. Left ventricular diastolic parameters are  consistent with Grade III diastolic  dysfunction (restrictive). Elevated left atrial pressure.   2. Right ventricular systolic function is normal. The right ventricular  size is mildly enlarged. There is normal pulmonary artery systolic  pressure.   3. Left atrial size was severely dilated.   4. Right atrial size was severely dilated.   5. The mitral valve is normal in structure. Mild mitral valve  regurgitation. No evidence of mitral stenosis.   6. Tricuspid valve regurgitation is moderate.   7. The aortic valve is normal in structure. Aortic valve regurgitation is  not visualized. No aortic stenosis is present.   8. The inferior vena cava is normal in size with greater than 50%  respiratory variability, suggesting right atrial pressure of 3 mmHg.   Recent Labs: 02/15/2021: BUN 17; Creatinine, Ser 0.75; Potassium 4.6; Sodium 137   Recent Lipid Panel No results found for: "CHOL", "TRIG", "HDL", "CHOLHDL", "VLDL", "LDLCALC", "LDLDIRECT"  Physical Exam:   VS:  BP 126/68   Pulse 65   Ht 5' (1.524 m)   Wt 163 lb 12.8 oz (74.3 kg)   SpO2 97%   BMI 31.99 kg/m    Wt Readings from Last 3 Encounters:  12/11/21 163 lb 12.8 oz (74.3 kg)  11/21/21 166 lb 12.8 oz (75.7 kg)  07/23/21 168 lb (76.2 kg)    General: Well nourished, well developed, in no acute distress Head: Atraumatic, normal size  Eyes: PEERLA, EOMI  Neck: Supple, no JVD Endocrine: No thryomegaly Cardiac: Normal S1, S2; irregular rhythm   Lungs: Clear to auscultation bilaterally, no wheezing, rhonchi or rales  Abd: Soft, nontender, no hepatomegaly  Ext: No edema, pulses 2+ Musculoskeletal: No deformities, BUE and BLE strength normal and equal Skin: Warm and dry, no rashes   Neuro: Alert and oriented to person, place, time, and situation, CNII-XII grossly intact, no focal deficits  Psych: Normal mood and affect   ASSESSMENT:   Whitney Burton is a 88 y.o. female who presents for the following: 1. SOB (shortness of breath)   2. Chronic diastolic heart failure ([place])   3. Essential hypertension   4. Permanent  atrial fibrillation ([place])   5. Acquired thrombophilia ([place])     PLAN:   1. SOB (shortness of breath) -Weight is actually down 4-5 pounds over the past few months.  She appears euvolemic.  Do not believe heart failure explains her symptoms.  I believe deconditioning and inactivity as well as an element of depression may be at play here.  We will reach back out to physical therapy to see if she can be reevaluated.  She does not like the physical therapist in her facility.  2. Chronic diastolic heart failure (HCC) -Euvolemic on examination.  Transition to torsemide as needed.  She will continue Aldactone 12.5 mg daily.  She will need labs checked by her primary care physician.  Reports jerking.  She will see them today.  They will need to do a full panel anyway.  3. Essential hypertension -Much better controlled on Aldactone 12.5 mg daily.  We will stop nebivolol given low heart rate.  Continue diltiazem as well as losartan.  4. Permanent atrial fibrillation ([place]) 5. Acquired thrombophilia ([place]) -Rate controlled A-fib.  Failed cardioversions in the past.  Not wanting any further aggressive care.  We will just continue with rate control strategy.  Stop nebivolol as bradycardia may be contributing.  Continue diltiazem extended release 240 mg daily.  She is on Eliquis with no major bleeding issues.  She did have nosebleeds but  they have resolved.  Okay to continue anticoagulation for now.  Disposition: Return in about 3 months (around 03/13/2022).  Medication Adjustments/Labs and Tests Ordered: Current medicines are reviewed at length with the patient today.  Concerns regarding medicines are outlined above.  Orders Placed This Encounter  Procedures   EKG 12-Lead   Meds ordered this encounter  Medications   torsemide (DEMADEX) 20 MG tablet    Sig: Take 1 tablet (20 mg total) by mouth as needed ([place]).    Dispense:  90 tablet    Refill:  3    Patient Instructions  Medication Instructions:  Your physician has recommended you make the following change in your medication:  CHANGE: Torsemide 20 mg as needed STOP: Nebivolol  *If you need a refill on your cardiac medications before your next appointment, please call your pharmacy*   Lab Work: None If you have labs (blood work) drawn today and your tests are completely normal, you will receive your results only by: [place] (if you have MyChart) OR A paper copy in the mail If you have any lab test that is abnormal or we need to change your treatment, we will call you to review the results.   Testing/Procedures: None   Follow-Up: At [facility], you and your health needs are our priority.  As part of our continuing mission to provide you with exceptional heart care, we have created designated Provider Care Teams.  These Care Teams include your primary Cardiologist (physician) and Advanced Practice Providers (APPs -  Physician Assistants and Nurse Practitioners) who all work together to provide you with the care you need, when you need it.  We recommend signing up for the patient portal called "MyChart".  Sign up information is provided on this After Visit Summary.  MyChart is used to connect with patients for Virtual Visits (Telemedicine).  Patients are able to view lab/test results, encounter notes, upcoming appointments, etc.  Non-urgent messages  can be sent to your provider as well.   To learn more about what you can do with MyChart, go to [email or web address].  Your next appointment:   3-4 month(s)  The format for your next appointment:   In Person  Provider:   Evalina Field, MD    Other Instructions   Important Information About Sugar         Time Spent with Patient: I have spent a total of 35 minutes with patient reviewing hospital notes, telemetry, EKGs, labs and examining the patient as well as establishing an assessment and plan that was discussed with the patient.  > 50% of time was spent in direct patient care.  Signed, Addison Naegeli. Audie Box, MD, [place]  [address] [place], [place] [address] [phone]  12/11/2021 12:19 PM

## 2021-12-09 NOTE — Telephone Encounter (Signed)
Spoke with pt she states she has been SOB all week. Denies weight gain. "SOB is all the time. I can't lay down." "I have trouble sleeping, I'm not sure if it's because of the shortness of breath or just sleep issues." Pt wants a response today. Pt made aware I'll be of this afternoon and will not be able to call her back.

## 2021-12-11 ENCOUNTER — Encounter: Payer: Self-pay | Admitting: Cardiovascular Disease

## 2021-12-11 ENCOUNTER — Ambulatory Visit: Payer: Medicare Other | Admitting: Cardiovascular Disease

## 2021-12-11 VITALS — BP 126/68 | HR 65 | Ht 60.0 in | Wt 163.8 lb

## 2021-12-11 DIAGNOSIS — G2581 Restless legs syndrome: Secondary | ICD-10-CM | POA: Diagnosis not present

## 2021-12-11 DIAGNOSIS — I5032 Chronic diastolic (congestive) heart failure: Secondary | ICD-10-CM

## 2021-12-11 DIAGNOSIS — I4821 Permanent atrial fibrillation: Secondary | ICD-10-CM

## 2021-12-11 DIAGNOSIS — I10 Essential (primary) hypertension: Secondary | ICD-10-CM | POA: Diagnosis not present

## 2021-12-11 DIAGNOSIS — R0602 Shortness of breath: Secondary | ICD-10-CM | POA: Diagnosis not present

## 2021-12-11 DIAGNOSIS — R0989 Other specified symptoms and signs involving the circulatory and respiratory systems: Secondary | ICD-10-CM | POA: Diagnosis not present

## 2021-12-11 DIAGNOSIS — R0681 Apnea, not elsewhere classified: Secondary | ICD-10-CM | POA: Diagnosis not present

## 2021-12-11 DIAGNOSIS — D6869 Other thrombophilia: Secondary | ICD-10-CM | POA: Diagnosis not present

## 2021-12-11 MED ORDER — TORSEMIDE 20 MG PO TABS: 20.0000 mg | ORAL_TABLET | ORAL | 3 refills | Status: DC | PRN

## 2021-12-11 NOTE — Patient Instructions (Signed)
Medication Instructions:  Your physician has recommended you make the following change in your medication:  CHANGE: Torsemide 20 mg as needed STOP: Nebivolol  *If you need a refill on your cardiac medications before your next appointment, please call your pharmacy*   Lab Work: None If you have labs (blood work) drawn today and your tests are completely normal, you will receive your results only by: [place] (if you have MyChart) OR A paper copy in the mail If you have any lab test that is abnormal or we need to change your treatment, we will call you to review the results.   Testing/Procedures: None   Follow-Up: At [facility], you and your health needs are our priority.  As part of our continuing mission to provide you with exceptional heart care, we have created designated Provider Care Teams.  These Care Teams include your primary Cardiologist (physician) and Advanced Practice Providers (APPs -  Physician Assistants and Nurse Practitioners) who all work together to provide you with the care you need, when you need it.  We recommend signing up for the patient portal called "MyChart".  Sign up information is provided on this After Visit Summary.  MyChart is used to connect with patients for Virtual Visits (Telemedicine).  Patients are able to view lab/test results, encounter notes, upcoming appointments, etc.  Non-urgent messages can be sent to your provider as well.   To learn more about what you can do with MyChart, go to [email or web address].    Your next appointment:   3-4 month(s)  The format for your next appointment:   In Person  Provider:   Evalina Field, MD    Other Instructions   Important Information About Sugar

## 2021-12-15 DIAGNOSIS — H6122 Impacted cerumen, left ear: Secondary | ICD-10-CM | POA: Diagnosis not present

## 2021-12-20 ENCOUNTER — Telehealth: Payer: Self-pay | Admitting: Cardiovascular Disease

## 2021-12-20 NOTE — Telephone Encounter (Signed)
Geralynn Rile, MD  Fidel Levy, RN Cc: Caprice Beaver, LPN Caller: Unspecified (Today, 10:14 AM) Yes, let's take once daily.   [place] T. Audie Box, MD, [place]  [address], [place]  [place], [place] [address]  [phone]  12:51 PM

## 2021-12-20 NOTE — Telephone Encounter (Signed)
Pt c/o medication issue:  1. Name of Medication: Bystolic  2. How are you currently taking this medication (dosage and times per day)? NA  3. Are you having a reaction (difficulty breathing--STAT)? No  4. What is your medication issue? Pt states that provider took her off medication. Since being off medication pt states that she's been feeling sick. Please advise

## 2021-12-20 NOTE — Telephone Encounter (Signed)
Patient called with update from MD. She has bystolic '20mg'$  tabs at home - no refill needed. Advised to take one tab QD. Added to med list

## 2021-12-20 NOTE — Telephone Encounter (Signed)
Spoke with patient of Dr. Audie Box - bystolic was stopped at 3/23 visit She was on bystolic '20mg'$  BID Patient reports feeling jittery, anxious since stopping bystolic  She has AFib so reports BP and HR are all over the place  Vitals today 121/92 HR 89  She is asking if she could just take this medication once daily  Advised will rout to MD to review

## 2022-01-07 DIAGNOSIS — R0989 Other specified symptoms and signs involving the circulatory and respiratory systems: Secondary | ICD-10-CM | POA: Diagnosis not present

## 2022-01-07 DIAGNOSIS — H9319 Tinnitus, unspecified ear: Secondary | ICD-10-CM | POA: Diagnosis not present

## 2022-01-07 DIAGNOSIS — I4891 Unspecified atrial fibrillation: Secondary | ICD-10-CM | POA: Diagnosis not present

## 2022-01-07 DIAGNOSIS — I10 Essential (primary) hypertension: Secondary | ICD-10-CM | POA: Diagnosis not present

## 2022-01-17 ENCOUNTER — Telehealth: Payer: Self-pay | Admitting: Cardiovascular Disease

## 2022-01-17 NOTE — Telephone Encounter (Signed)
Pt c/o BP issue: STAT if pt c/o blurred vision, one-sided weakness or slurred speech  1. What are your last 5 BP readings? 150/105 HR 80, 148/116 HR 90  2. Are you having any other symptoms (ex. Dizziness, headache, blurred vision, passed out)? Shaky, nauseas  3. What is your BP issue? Patient's caregiver Aniceto Boss states the patient is very shaky, nauseas, and her BP is very high. Phone: [phone]

## 2022-01-17 NOTE — Telephone Encounter (Signed)
Patient's caregiver called to report elevated blood pressures of 150/105 and 2 hours after medications 139/102, P 80. Also had a reading of 148/116. Patient reports feeling shaky and not feeling well. She has decreased appetite and believes that is what is causing her nausea. She monitors sodium intake when she does eat. She denies headache or chest discomfort. Does report heat in neck and face. She goes up and down her apartment stairs at least 2-3 x/day. This is her only exercise. Caregiver wants to know if BP meds can be taken at different times throughout the day. Recommended Ensure for snacks 3x/day. Please advise on BP meds and Ensure.

## 2022-01-17 NOTE — Telephone Encounter (Signed)
Called and spoke with patient and caregiver, advised of message from MD.  Caregiver and patient verbalized understanding.

## 2022-01-19 ENCOUNTER — Emergency Department (HOSPITAL_BASED_OUTPATIENT_CLINIC_OR_DEPARTMENT_OTHER): Payer: Medicare Other

## 2022-01-19 ENCOUNTER — Emergency Department (HOSPITAL_BASED_OUTPATIENT_CLINIC_OR_DEPARTMENT_OTHER)
Admission: EM | Admit: 2022-01-19 | Discharge: 2022-01-19 | Disposition: A | Discharge status: Home / Self Care | Payer: Medicare Other | Attending: Emergency Medicine | Admitting: Emergency Medicine

## 2022-01-19 ENCOUNTER — Encounter (HOSPITAL_BASED_OUTPATIENT_CLINIC_OR_DEPARTMENT_OTHER): Payer: Self-pay | Admitting: Emergency Medicine

## 2022-01-19 ENCOUNTER — Other Ambulatory Visit: Payer: Self-pay

## 2022-01-19 DIAGNOSIS — I5032 Chronic diastolic (congestive) heart failure: Secondary | ICD-10-CM | POA: Insufficient documentation

## 2022-01-19 DIAGNOSIS — Z853 Personal history of malignant neoplasm of breast: Secondary | ICD-10-CM | POA: Insufficient documentation

## 2022-01-19 DIAGNOSIS — Z79899 Other long term (current) drug therapy: Secondary | ICD-10-CM | POA: Diagnosis not present

## 2022-01-19 DIAGNOSIS — F419 Anxiety disorder, unspecified: Secondary | ICD-10-CM | POA: Insufficient documentation

## 2022-01-19 DIAGNOSIS — Z7901 Long term (current) use of anticoagulants: Secondary | ICD-10-CM | POA: Insufficient documentation

## 2022-01-19 DIAGNOSIS — R0789 Other chest pain: Secondary | ICD-10-CM | POA: Diagnosis not present

## 2022-01-19 DIAGNOSIS — R197 Diarrhea, unspecified: Secondary | ICD-10-CM | POA: Diagnosis not present

## 2022-01-19 DIAGNOSIS — I110 Hypertensive heart disease with heart failure: Secondary | ICD-10-CM | POA: Diagnosis not present

## 2022-01-19 LAB — CBC WITH DIFFERENTIAL/PLATELET
Abs Immature Granulocytes: 0.03 10*3/uL (ref 0.00–0.07)
Basophils Absolute: 0 10*3/uL (ref 0.0–0.1)
Basophils Relative: 0 %
Eosinophils Absolute: 0 10*3/uL (ref 0.0–0.5)
Eosinophils Relative: 0 %
HCT: 45 % (ref 36.0–46.0)
Hemoglobin: 15.8 g/dL — ABNORMAL HIGH (ref 12.0–15.0)
Immature Granulocytes: 0 %
Lymphocytes Relative: 17 %
Lymphs Abs: 1.1 10*3/uL (ref 0.7–4.0)
MCH: 33.3 pg (ref 26.0–34.0)
MCHC: 35.1 g/dL (ref 30.0–36.0)
MCV: 94.9 fL (ref 80.0–100.0)
Monocytes Absolute: 0.7 10*3/uL (ref 0.1–1.0)
Monocytes Relative: 10 %
Neutro Abs: 4.9 10*3/uL (ref 1.7–7.7)
Neutrophils Relative %: 73 %
Platelets: 340 10*3/uL (ref 150–400)
RBC: 4.74 MIL/uL (ref 3.87–5.11)
RDW: 14.9 % (ref 11.5–15.5)
WBC: 6.7 10*3/uL (ref 4.0–10.5)
nRBC: 0 % (ref 0.0–0.2)

## 2022-01-19 LAB — COMPREHENSIVE METABOLIC PANEL
ALT: 21 U/L (ref 0–44)
AST: 16 U/L (ref 15–41)
Albumin: 4.4 g/dL (ref 3.5–5.0)
Alkaline Phosphatase: 51 U/L (ref 38–126)
Anion gap: 9 (ref 5–15)
BUN: 17 mg/dL (ref 8–23)
CO2: 25 mmol/L (ref 22–32)
Calcium: 10.4 mg/dL — ABNORMAL HIGH (ref 8.9–10.3)
Chloride: 97 mmol/L — ABNORMAL LOW (ref 98–111)
Creatinine, Ser: 0.87 mg/dL (ref 0.44–1.00)
GFR, Estimated: >60 mL/min (ref >60)
Glucose, Bld: 103 mg/dL — ABNORMAL HIGH (ref 70–99)
Potassium: 4.5 mmol/L (ref 3.5–5.1)
Sodium: 131 mmol/L — ABNORMAL LOW (ref 135–145)
Total Bilirubin: 0.7 mg/dL (ref 0.3–1.2)
Total Protein: 6.9 g/dL (ref 6.5–8.1)

## 2022-01-19 LAB — TROPONIN I (HIGH SENSITIVITY)
Troponin I (High Sensitivity): 6 ng/L (ref <18)
Troponin I (High Sensitivity): 6 ng/L (ref <18)

## 2022-01-19 LAB — URINALYSIS, ROUTINE W REFLEX MICROSCOPIC
Bilirubin Urine: NEGATIVE
Glucose, UA: NEGATIVE mg/dL
Hgb urine dipstick: NEGATIVE
Ketones, ur: NEGATIVE mg/dL
Leukocytes,Ua: NEGATIVE
Nitrite: NEGATIVE
Protein, ur: NEGATIVE mg/dL
Specific Gravity, Urine: 1.009 (ref 1.005–1.030)
pH: 5.5 (ref 5.0–8.0)

## 2022-01-19 LAB — TSH: TSH: 1.023 u[IU]/mL (ref 0.350–4.500)

## 2022-01-19 LAB — T4, FREE: Free T4: 1.42 ng/dL — ABNORMAL HIGH (ref 0.61–1.12)

## 2022-01-19 MED ORDER — PANTOPRAZOLE SODIUM 20 MG PO TBEC: 40.0000 mg | DELAYED_RELEASE_TABLET | Freq: Every day | ORAL | 0 refills | Status: DC

## 2022-01-19 NOTE — ED Provider Notes (Signed)
[facility] EMERGENCY DEPT Provider Note   CSN: [id number] Arrival date & time: 01/19/22  0747     History  Chief Complaint  Patient presents with   multiple    Multiple complaints    Whitney Burton is a 88 y.o. female.  HPI     88 year old female with a history of permanent atrial fibrillation on eliquis, osteoporosis, history of breast cancer, essential hypertension, chronic diastolic heart failure, tinnitus, anxiety who presents with concern for episodes of sensation of heat across chest to arms, fatigue and anxiety.   Last night woke up with a "heat" across top of chest to bilateral arms to elbow, woke up with it at 2AM, wouldn't last long, would go back to sleep then wake up and it would be back.  Has been happening over the last few weeks, thinks it happens during day, not exertional, comes out of the blue Yesterday had a good day then last night began to feel this Anxiety with it, does not notice heart racing Shortness of breath with exertion, fatigue, going on for a long time. No worsening dyspnea last night Nausea, has been ongoing, not worse last night Has not taken bp medications today Diarrhea 2-3 days, rectal burning, comes and goes.  No black or bloody stool, was lighter color.  No abdominal pain, just intestinal rumbling Ears ringing/seeing PCP, no change, will have occasional single thump. No headache, nubmness/weakness Fatigue, some dyspnea on exertion. Does not sleep laying down at baseline but denies orthopnea Symptoms have been going on for about 2 months.  Seeing PCP, saw cardiology. Not particularly worse now but daughter had encouraged her to come to ED given continuing symptoms.     Started half a pill of sertraline 6/30 then stopped for 5 days because felt fuzzy then started again after seeing Dr. 7/25   Has been seeing her PCP regarding this and was also evaluated by Dr. Marisue Ivan Cardiology.  Was transitioned to torsemide as needed at  appointment in June as was euvolemic.   Her PCP had tried to put her on antianxiety medications in the past.   Brings a logbook of BP with values 748 systolic-120s    Home Medications Prior to Admission medications   Medication Sig Start Date End Date Taking? Authorizing Provider  Cholecalciferol (VITAMIN D3) 25 MCG (1000 UT) CAPS Take by mouth. Take 1000 IU daily   Yes [provider]  ELIQUIS 5 MG TABS tablet TAKE 1 TABLET BY MOUTH TWICE A DAY 09/24/21  Yes O'Neal, Cassie Freer, MD  losartan (COZAAR) 100 MG tablet TAKE 1 TABLET BY MOUTH EVERY DAY 10/14/21  Yes O'Neal, Cassie Freer, MD  Nebivolol HCl 20 MG TABS Take 1 tablet by mouth daily.   Yes [provider]  pantoprazole (PROTONIX) 20 MG tablet Take 2 tablets (40 mg total) by mouth daily for 14 days. 01/19/22 02/02/22 Yes Gareth Morgan, MD  sertraline (ZOLOFT) 100 MG tablet Take 100 mg by mouth daily. Started this week   Yes [provider]  spironolactone (ALDACTONE) 25 MG tablet Take 0.5 tablets (12.5 mg total) by mouth daily. 07/23/21 01/19/22 Yes O'Neal, Cassie Freer, MD  TIADYLT ER 240 MG 24 hr capsule TAKE 1 CAPSULE BY MOUTH EVERY DAY 04/09/21  Yes O'Neal, Cassie Freer, MD  acetaminophen (TYLENOL) 500 MG tablet 1 tablet as needed    [provider]  moxifloxacin (VIGAMOX) 0.5 % ophthalmic solution Place 1 drop into the left eye 4 (four) times daily. 11/07/21   [provider]  prednisoLONE acetate (PRED FORTE) 1 % ophthalmic suspension Place 1 drop into the left eye 4 (four) times daily. 11/07/21   [provider]  torsemide (DEMADEX) 20 MG tablet Take 1 tablet (20 mg total) by mouth as needed ([place]). 12/11/21   O'Neal, Cassie Freer, MD      Allergies    Ciprofloxacin hcl, Levofloxacin, Ondansetron hcl, and Furosemide    Review of Systems   Review of Systems  Physical Exam Updated Vital Signs BP (!) 135/102   Pulse 70   Temp 97.6 F (36.4 C) (Oral)   Resp 20   SpO2 99%   Physical Exam Vitals and nursing note reviewed.  Constitutional:      General: She is not in acute distress.    Appearance: She is well-developed. She is not diaphoretic.  HENT:     Head: Normocephalic and atraumatic.  Eyes:     Conjunctiva/sclera: Conjunctivae normal.  Cardiovascular:     Rate and Rhythm: Normal rate. Rhythm irregular.     Heart sounds: Normal heart sounds. No murmur heard.    No friction rub. No gallop.  Pulmonary:     Effort: Pulmonary effort is normal. No respiratory distress.     Breath sounds: Normal breath sounds. No wheezing or rales.  Abdominal:     General: There is no distension.     Palpations: Abdomen is soft.     Tenderness: There is no abdominal tenderness. There is no guarding.  Musculoskeletal:        General: No tenderness.     Cervical back: Normal range of motion.  Skin:    General: Skin is warm and dry.     Findings: No erythema or rash.  Neurological:     Mental Status: She is alert and oriented to person, place, and time.     Sensory: No sensory deficit.     Motor: No weakness.     Comments: Normal EOM, pupils, midline uvula and tongue, symmetric smile     ED Results / Procedures / Treatments   Labs (all labs ordered are listed, but only abnormal results are displayed) Labs Reviewed  CBC WITH DIFFERENTIAL/PLATELET - Abnormal; Notable for the following components:      Result Value   Hemoglobin 15.8 (*)    All other components within normal limits  COMPREHENSIVE METABOLIC PANEL - Abnormal; Notable for the following components:   Sodium 131 (*)    Chloride 97 (*)    Glucose, Bld 103 (*)    Calcium 10.4 (*)    All other components within normal limits  T4, FREE - Abnormal; Notable for the following components:   Free T4 1.42 (*)    All other components within normal limits  URINALYSIS, ROUTINE W REFLEX MICROSCOPIC  TSH  TROPONIN I (HIGH SENSITIVITY)  TROPONIN I (HIGH SENSITIVITY)    EKG EKG  Interpretation  Date/Time:  Sunday January 19 2022 08:06:04 EDT Ventricular Rate:  95 PR Interval:    QRS Duration: 91 QT Interval:  345 QTC Calculation: 439 R Axis:   146 Text Interpretation: Atrial fibrillation Left posterior fascicular block Anterolateral infarct, age indeterminate No previous ECG Confirmed by Gareth Morgan [phone]) on 01/19/2022 12:24:29 PM  Radiology DG Chest Port 1 View  Result Date: 01/19/2022 CLINICAL DATA:  Diarrhea. EXAM: PORTABLE CHEST 1 VIEW COMPARISON:  07/25/2021 FINDINGS: The lungs are clear without focal pneumonia, edema, pneumothorax or pleural effusion. The cardio pericardial silhouette is enlarged. Bones are diffusely demineralized. Telemetry leads  overlie the chest. IMPRESSION: No active disease. Electronically Signed   By: Misty Stanley M.D.   On: 01/19/2022 09:20    Procedures Procedures    Medications Ordered in ED Medications - No data to display  ED Course/ Medical Decision Making/ A&P                            88 year old female with a history of permanent atrial fibrillation on eliquis, osteoporosis, history of breast cancer, essential hypertension, chronic diastolic heart failure, tinnitus, anxiety who presents with concern for episodes of sensation of heat across chest to arms, fatigue and anxiety.   Differential diagnosis includes ACS, cardiac arrhythmia, electrolyte abnormality, anemia, anxiety, pneumonia, UTI, toxic/emtabolic.     Does not describe focal neurologic symptoms do not suspect CVA given history and exam.  History and exam are also not consistent with a pulmonary embolus in a patient on Eliquis, or consistent with a intra-abdominal emergency or aortic dissection  EKG personally completed and interpreted by me shows atrial fibrillation without other acute ST changes.  CXR completed and personally evaluated and interpreted by me shows no acute abnormalities.  Labs completed and personally evaluated interpreted by me show  no significant anemia or electrolyte abnormalities, hgb 15.8 and sodium 131.  TSH completed and WNL. Troponin negative and repeated 2 hours later and negative.  Given symptoms present prior to sertraline feel this is less likely exacerbating factor. Discussed may increase sertraline to full tablet, take her rx xanax as needed also for anxiety.   Also reports she occasionally will regurgitate food and feels like food gets stuck at times. Was previously referred to [facility] for epistaxis--recommend follow up with them for this dysphagia, consider zenker's diverticulum. Do not see indication for admission/emergent procedure.  Will start protonix.   Recommend continued outpatient evaluation of symptoms, follow up with PCP and Cardiology.   Patient discharged in stable condition with understanding of reasons to return.         Final Clinical Impression(s) / ED Diagnoses Final diagnoses:  Anxiety  Chest discomfort    Rx / DC Orders ED Discharge Orders          Ordered    pantoprazole (PROTONIX) 20 MG tablet  Daily        01/19/22 1320              Gareth Morgan, MD 01/19/22 2224

## 2022-01-19 NOTE — ED Triage Notes (Signed)
Pt cannot focus in triage, is looking in her book to look at her symptoms. Pt states her arms were burning 3 x overnight. Pt starts talking about her ears are ringing for a month, and has diarrhea for 2 days.

## 2022-01-19 NOTE — ED Triage Notes (Signed)
Pt was started on sertraline this week for anxiety.

## 2022-01-19 NOTE — ED Notes (Signed)
Discharge instructions, follow up care, and prescription reviewed and explained, pt verbalized understanding. Pt caox4 and ambulatory on d/c.  

## 2022-01-24 ENCOUNTER — Telehealth: Payer: Self-pay | Admitting: Cardiovascular Disease

## 2022-01-24 DIAGNOSIS — I4891 Unspecified atrial fibrillation: Secondary | ICD-10-CM | POA: Diagnosis not present

## 2022-01-24 DIAGNOSIS — D6869 Other thrombophilia: Secondary | ICD-10-CM | POA: Diagnosis not present

## 2022-01-24 DIAGNOSIS — H9319 Tinnitus, unspecified ear: Secondary | ICD-10-CM | POA: Diagnosis not present

## 2022-01-24 DIAGNOSIS — J387 Other diseases of larynx: Secondary | ICD-10-CM | POA: Diagnosis not present

## 2022-01-24 DIAGNOSIS — I10 Essential (primary) hypertension: Secondary | ICD-10-CM | POA: Diagnosis not present

## 2022-01-24 NOTE — Telephone Encounter (Signed)
Tasha informed that patient is to take losartan '50mg'$  in the morning and '50mg'$  in the evening. She asked what range BP should be. Informed her that if DBP is consistently over 80 or SBP is consistently 105 or below, to notify clinic. She stated she will advise patient of dose change.

## 2022-01-24 NOTE — Telephone Encounter (Signed)
Spoke with caregiver Aniceto Boss who reports that patient's BP is higher in the mornings. She wants to know if patient can take certain BP meds in the evening instead of morning and which drug would be better to take at night. Please advise.   Before meds - 131/108 After meds - 123/77 Readings after she wakes up - 115/101 114/92 129/93 145/101

## 2022-01-24 NOTE — Telephone Encounter (Signed)
Pt c/o BP issue: STAT if pt c/o blurred vision, one-sided weakness or slurred speech  1. What are your last 5 BP readings?  Checking when wakes up - bottom number is still high and spotty throughout the day   Before meds - 131/108 After meds - 123/77 Readings after she wakes up - 115/101 114/92 129/93 145/101  2. Are you having any other symptoms (ex. Dizziness, headache, blurred vision, passed out)?   3. What is your BP issue? Tasha, pt caregiver, called stating that pt is concerned about her bp being high in the morning. She stated it is giving the pt anxiety at night. She wants to know if she can take her meds at a different time or increase nebivolol dosage again. Please advise.

## 2022-01-24 NOTE — Telephone Encounter (Signed)
Patient calling back to inform she will be leaving by 3 pm and is not sure when she will be back.

## 2022-01-28 ENCOUNTER — Telehealth: Payer: Self-pay | Admitting: Cardiovascular Disease

## 2022-01-28 NOTE — Telephone Encounter (Signed)
Called patient, advised of message from MD.  Patient verbalized understanding.   

## 2022-01-28 NOTE — Telephone Encounter (Signed)
Called patient, she states that she is checking her BP multiple times a day- she is anxious on the phone in regards to the BP numbers. She states right now her BP was 100/71 HR 67- she states she wanted to know if she should hold her Losartan dose this evening- we did discuss that her BP could change between now and her evening dose later this evening. Patient aware to not check it multiple times as this can increase BP numbers as well. Patient will keep [place] updated on any changes or recommendations. We also discussed the recent change was given on Friday- it can take a few doses for BP to change. Patient verbalized understanding.   Thanks!    Just sending to you as FYI from her Friday call. Thanks!

## 2022-01-28 NOTE — Telephone Encounter (Signed)
Patient called to talk with Dr. Audie Box or nurse regarding patient's BP. Says that she has been working with the nurse about it

## 2022-01-31 ENCOUNTER — Other Ambulatory Visit: Payer: Self-pay

## 2022-01-31 MED ORDER — LOSARTAN POTASSIUM 50 MG PO TABS: 50.0000 mg | ORAL_TABLET | Freq: Every day | ORAL | 1 refills | Status: DC

## 2022-01-31 MED ORDER — SPIRONOLACTONE 25 MG PO TABS: 25.0000 mg | ORAL_TABLET | Freq: Every day | ORAL | 1 refills | Status: DC

## 2022-01-31 NOTE — Telephone Encounter (Signed)
Have her take the losartan and nebivolol at opposite times of the day

## 2022-01-31 NOTE — Telephone Encounter (Signed)
Patient informed of PharmD reply.Marland KitchenMarland Kitchen"Have her take the losartan and nebivolol at opposite times of the day." Patient said she will take losartan in the morning and nebivolol in the evening.

## 2022-01-31 NOTE — Telephone Encounter (Signed)
Reviewed BP medications with patient. Ordered and updated new losartan dose of '50mg'$  daily and new spironolactone dose of '25mg'$  daily. The rest of cardiac meds are correct. To PharmD: patient takes BP meds in the morning. Her BP drops in the afternoon. Do you have suggestions on which meds she can take in the morning and which ones in the evening?

## 2022-01-31 NOTE — Telephone Encounter (Signed)
Pt c/o medication issue:  1. Name of Medication:   All blood pressure medications  2. How are you currently taking this medication (dosage and times per day)? As prescribed  3. Are you having a reaction (difficulty breathing--STAT)?  No  4. What is your medication issue?   Patient called to go over blood pressure medications and their dosage.  Patient stated she may be away from her phone after 4pm.

## 2022-02-04 DIAGNOSIS — I4891 Unspecified atrial fibrillation: Secondary | ICD-10-CM | POA: Diagnosis not present

## 2022-02-04 DIAGNOSIS — K219 Gastro-esophageal reflux disease without esophagitis: Secondary | ICD-10-CM | POA: Diagnosis not present

## 2022-02-04 DIAGNOSIS — I10 Essential (primary) hypertension: Secondary | ICD-10-CM | POA: Diagnosis not present

## 2022-02-04 DIAGNOSIS — H9319 Tinnitus, unspecified ear: Secondary | ICD-10-CM | POA: Diagnosis not present

## 2022-02-04 DIAGNOSIS — I5032 Chronic diastolic (congestive) heart failure: Secondary | ICD-10-CM | POA: Diagnosis not present

## 2022-02-04 DIAGNOSIS — J387 Other diseases of larynx: Secondary | ICD-10-CM | POA: Diagnosis not present

## 2022-02-23 ENCOUNTER — Other Ambulatory Visit: Payer: Self-pay | Admitting: Cardiovascular Disease

## 2022-02-24 ENCOUNTER — Telehealth: Payer: Self-pay | Admitting: Cardiovascular Disease

## 2022-02-24 NOTE — Telephone Encounter (Signed)
Pt c/o medication issue:  1. Name of Medication: ELIQUIS 5 MG TABS tablet  2. How are you currently taking this medication (dosage and times per day)? As prescribed   3. Are you having a reaction (difficulty breathing--STAT)? Yes  4. What is your medication issue? Patient is having second nose bleed of the day. BP was 147/106 at  8:30 am, in the afternoon it was 116/96, & at 3:00 pm 100/74. Please advise.

## 2022-02-24 NOTE — Telephone Encounter (Addendum)
Spoke with Dr. Harl Bowie (DOD) about patient's nosebleeds. Order for patient to take evening dose of eliquis and to contact clinic tomorrow morning if there is more bleeding throughout the night. When I informed patient, she stated her BP this morning was 147/106. She said she was anxious at that time over nosebleed. At 4 pm it was 102/85. She wanted an appointment regarding BP. Appointment made with Arnold Long, FNP-BC for Friday 9/15. Told patient I will call her at 8 AM tomorrow.

## 2022-02-24 NOTE — Telephone Encounter (Signed)
Patient stated that at 9 AM this morning, she had a nosebleed. She didn't believe that it was flowing. She stuck a tissue into her nostril for about 10 minutes and there was no clot when she removed it or further bleeding. At this time 4pm she had another nosebleed as before. She wants to know if she should take any more eliquis.

## 2022-02-25 ENCOUNTER — Telehealth: Payer: Self-pay

## 2022-02-25 NOTE — Telephone Encounter (Addendum)
Patient stated she did not have nosebleeds during the night. Her BP at 6:45 this AM was 144/117, 144/96, 118/99. Denies chest pain, dizziness. She will continue with taking eliquis and notify clinic if she bleeds again.

## 2022-02-27 NOTE — Progress Notes (Unsigned)
Cardiology Clinic Note   Patient Name: Whitney Burton Date of Encounter: 02/28/2022  Primary Care Provider:  Donald Prose, MD Primary Cardiologist:  Evalina Field, MD  Patient Profile    88 year old female with history of permanent atrial fibrillation, hypertension, HFpEF. Most recent echocardiogram on 08/08/2021 revealed normal LV function with Grade III diastolic dysfunction (restrictive).    Past Medical History    Past Medical History:  Diagnosis Date   Arrhythmia    Hypertension    Past Surgical History:  Procedure Laterality Date   HEMORRHOID SURGERY     PARTIAL HYSTERECTOMY      Allergies  Allergies  Allergen Reactions   Ciprofloxacin Hcl     Other reaction(s): Unknown   Levofloxacin     Other reaction(s): Unknown   Ondansetron Hcl     Other reaction(s): constipation   Furosemide Itching    Itching after taking Lasix?    History of Present Illness    Whitney Burton is a 88 year old female with are following for ongoing assessment and management of permanent atrial fib on Eliquis, CHADS VASC Score of 4 (HTN, Age,Gender), Grade III diastolic dysfunction with restrictive CM, & HTN She called our office on 02/24/2022 with complaints of nose bleeds on Eliquis.   She comes today with concerns about her blood pressure.  She is taking it several times a day noticing it really elevated in the morning and very low in the evening.  She is asymptomatic with this but also states that she is not very active.  She is currently living in a retirement community, she does walk to go pick up her meals so that she can eat in her apartment, and to check her mail, but that is the extent of her activity.  She is very concerned about her blood pressure readings and brings with her a list of her blood pressures.  It is noted that it is elevated in the morning prior to taking her medications in the 604V systolic range.  By the evening time blood pressure has gone down to high 97/60 at times but not  consistently.  She did fall at home, she said she tripped, there was no dizziness, presyncope, or syncope associated.  She did not injure herself significantly.  She is now using a rolling walker for stability and support.  Home Medications    Current Outpatient Medications  Medication Sig Dispense Refill   acetaminophen (TYLENOL) 500 MG tablet 1 tablet as needed     Cholecalciferol (VITAMIN D3) 25 MCG (1000 UT) CAPS Take by mouth. Take 1000 IU daily     citalopram (CELEXA) 10 MG tablet Take 10 mg by mouth daily.     ELIQUIS 5 MG TABS tablet TAKE 1 TABLET BY MOUTH TWICE A DAY 180 tablet 1   hydrOXYzine (ATARAX) 25 MG tablet Take 12.5-25 mg by mouth daily as needed.     losartan (COZAAR) 50 MG tablet TAKE 1 TABLET BY MOUTH EVERY DAY 90 tablet 3   moxifloxacin (VIGAMOX) 0.5 % ophthalmic solution Place 1 drop into the left eye 4 (four) times daily.     Nebivolol HCl 20 MG TABS Take 1 tablet by mouth daily.     prednisoLONE acetate (PRED FORTE) 1 % ophthalmic suspension Place 1 drop into the left eye 4 (four) times daily.     sertraline (ZOLOFT) 100 MG tablet Take 50 mg by mouth daily. Started this week     sertraline (ZOLOFT) 50 MG tablet Take  50 mg by mouth 2 (two) times daily.     spironolactone (ALDACTONE) 25 MG tablet Take 1 tablet (25 mg total) by mouth daily. 90 tablet 3   TIADYLT ER 240 MG 24 hr capsule TAKE 1 CAPSULE BY MOUTH EVERY DAY 90 capsule 3   torsemide (DEMADEX) 20 MG tablet Take 1 tablet (20 mg total) by mouth as needed (Edema). 90 tablet 3   pantoprazole (PROTONIX) 20 MG tablet Take 2 tablets (40 mg total) by mouth daily for 14 days. 28 tablet 0   No current facility-administered medications for this visit.     Family History    Family History  Problem Relation Age of Onset   Heart attack Father    She indicated that the status of her father is unknown.  Social History    Social History   Socioeconomic History   Marital status: Widowed    Spouse name: Not on  file   Number of children: Not on file   Years of education: Not on file   Highest education level: Not on file  Occupational History   Not on file  Tobacco Use   Smoking status: Former   Smokeless tobacco: Never  Vaping Use   Vaping Use: Never used  Substance and Sexual Activity   Alcohol use: Not Currently   Drug use: Never   Sexual activity: Not on file  Other Topics Concern   Not on file  Social History Narrative   Widowed. Moved to [place] to be closer to daughter.    Social Determinants of Health   Financial Resource Strain: Not on file  Food Insecurity: Not on file  Transportation Needs: Not on file  Physical Activity: Not on file  Stress: Not on file  Social Connections: Not on file  Intimate Partner Violence: Not on file     Review of Systems    General:  No chills, fever, night sweats or weight changes.  Cardiovascular:  No chest pain, dyspnea on exertion, edema, orthopnea, palpitations, paroxysmal nocturnal dyspnea. Dermatological: No rash, lesions/masses Respiratory: No cough, dyspnea Urologic: No hematuria, dysuria Abdominal:   No nausea, vomiting, diarrhea, bright red blood per rectum, melena, or hematemesis Neurologic:  No visual changes, wkns, changes in mental status. All other systems reviewed and are otherwise negative except as noted above.     Physical Exam    VS:  BP 124/78   Pulse 71   Ht 5' (1.524 m)   Wt 155 lb 6.4 oz (70.5 kg)   SpO2 97%   BMI 30.35 kg/m  , BMI Body mass index is 30.35 kg/m.     GEN: Well nourished, well developed, in no acute distress. HEENT: normal. Neck: Supple, no JVD, carotid bruits, or masses. Cardiac: IRRR, no murmurs, rubs, or gallops. No clubbing, cyanosis, mild dependent edema.  Radials/DP/PT 2+ and equal bilaterally.  Respiratory:  Respirations regular and unlabored, clear to auscultation bilaterally. GI: Soft, nontender, nondistended, BS + x 4. MS: no deformity or atrophy. Skin: warm and dry, no  rash. Neuro:  Strength and sensation are intact. Psych: Normal affect.  Accessory Clinical Findings    ECG personally reviewed by me today- [not completed this office visit  Lab Results  Component Value Date   WBC 6.7 01/19/2022   HGB 15.8 (H) 01/19/2022   HCT 45.0 01/19/2022   MCV 94.9 01/19/2022   PLT 340 01/19/2022   Lab Results  Component Value Date   CREATININE 0.87 01/19/2022   BUN 17 01/19/2022  NA 131 (L) 01/19/2022   K 4.5 01/19/2022   CL 97 (L) 01/19/2022   CO2 25 01/19/2022   Lab Results  Component Value Date   ALT 21 01/19/2022   AST 16 01/19/2022   ALKPHOS 51 01/19/2022   BILITOT 0.7 01/19/2022   No results found for: "CHOL", "HDL", "LDLCALC", "LDLDIRECT", "TRIG", "CHOLHDL"  No results found for: "HGBA1C"  Review of Prior Studies: Echocardiogram 08/08/2021 1. Left ventricular ejection fraction, by estimation, is 55 to 60%. The  left ventricle has normal function. The left ventricle has no regional  wall motion abnormalities. Left ventricular diastolic parameters are  consistent with Grade III diastolic  dysfunction (restrictive). Elevated left atrial pressure.   2. Right ventricular systolic function is normal. The right ventricular  size is mildly enlarged. There is normal pulmonary artery systolic  pressure.   3. Left atrial size was severely dilated.   4. Right atrial size was severely dilated.   5. The mitral valve is normal in structure. Mild mitral valve  regurgitation. No evidence of mitral stenosis.   6. Tricuspid valve regurgitation is moderate.   7. The aortic valve is normal in structure. Aortic valve regurgitation is  not visualized. No aortic stenosis is present.   8. The inferior vena cava is normal in size with greater than 50%  respiratory variability, suggesting right atrial pressure of 3 mmHg.   Assessment & Plan   1.  Hypertension: I have reviewed her medications at length.  It is currently well controlled.  She is not taking  any torsemide which may be causing hypotension in the evenings.  She uses it only for occasional lower extremity edema.  I will have her take spironolactone 12.5 mg in the evening as opposed to 25 mg in the morning.  She is to avoid her diuretic unless she is significantly edematous.  She will continue on nebivolol, losartan as directed.  She may take her blood pressure once a day at the same time every day.  She has a caretaker with her who will help to support this.  2.  Permanent atrial fibrillation: She remains on nebivolol, and  Tiadylt ER for heart rate control.  Eliquis at 5 mg twice daily we will continue.  She has been having some nosebleeds which have not been significantly prominent, or long-lasting.  We have discussed stopping Eliquis, but have also discussed risk of CVA as well.  She would like to continue Eliquis and let us the epistaxis becomes worse or more lengthy.  Should this be the case we will reconsider stopping Eliquis and would start her on full dose aspirin 325 mg daily.  3.  Deconditioning: I have advised her to consider joining a low impact exercise class that is provided for her at her retirement community.  I also advised her to take a walk instead of checking her blood pressure when she is anxious.  Having her replace something that causes her anxiety with something that is positive and reduces her anxiety should be considered.  Secondly, I have provided her a letter so that she can have a discount on her rolling walker.  Due to her deconditioning I think having a rolling walker is a good safety measure for stability.  If she has other falls we may also need to consider stopping Eliquis.    Current medicines are reviewed at length with the patient today.  I have spent 25 min's  dedicated to the care of this patient on the date of  this encounter to include pre-visit review of records, assessment, management and diagnostic testing,with shared decision  making.   Signed, Phill Myron. West Pugh, ANP, AACC   02/28/2022 10:05 AM      Office [phone] Fax [phone]  Notice: This dictation was prepared with Dragon dictation along with smaller phrase technology. Any transcriptional errors that result from this process are unintentional and may not be corrected upon review.

## 2022-02-28 ENCOUNTER — Ambulatory Visit: Payer: Medicare Other | Attending: Adult Health | Admitting: Adult Health

## 2022-02-28 ENCOUNTER — Ambulatory Visit (HOSPITAL_BASED_OUTPATIENT_CLINIC_OR_DEPARTMENT_OTHER): Payer: Medicare Other | Admitting: Nurse Practitioner

## 2022-02-28 ENCOUNTER — Encounter: Payer: Self-pay | Admitting: Adult Health

## 2022-02-28 VITALS — BP 124/78 | HR 71 | Ht 60.0 in | Wt 155.4 lb

## 2022-02-28 DIAGNOSIS — R5381 Other malaise: Secondary | ICD-10-CM | POA: Diagnosis not present

## 2022-02-28 DIAGNOSIS — I4811 Longstanding persistent atrial fibrillation: Secondary | ICD-10-CM

## 2022-02-28 DIAGNOSIS — F418 Other specified anxiety disorders: Secondary | ICD-10-CM | POA: Diagnosis not present

## 2022-02-28 NOTE — Patient Instructions (Addendum)
Medication Instructions:  Decrease Spirolactone to 12.5 mg ( Take 1/2 of 25 mg Tablet Daily)/ *If you need a refill on your cardiac medications before your next appointment, please call your pharmacy*   Lab Work: No Labs If you have labs (blood work) drawn today and your tests are completely normal, you will receive your results only by: Whitney Burton (if you have MyChart) OR A paper copy in the mail If you have any lab test that is abnormal or we need to change your treatment, we will call you to review the results.   Testing/Procedures: No Testing   Follow-Up: At [facility], you and your health needs are our priority.  As part of our continuing mission to provide you with exceptional heart care, we have created designated Provider Care Teams.  These Care Teams include your primary Cardiologist (physician) and Advanced Practice Providers (APPs -  Physician Assistants and Nurse Practitioners) who all work together to provide you with the care you need, when you need it.  We recommend signing up for the patient portal called "MyChart".  Sign up information is provided on this After Visit Summary.  MyChart is used to connect with patients for Virtual Visits (Telemedicine).  Patients are able to view lab/test results, encounter notes, upcoming appointments, etc.  Non-urgent messages can be sent to your provider as well.   To learn more about what you can do with MyChart, go to [email or web address].    Your next appointment:   Keep Scheduled Appointment  The format for your next appointment:   In Person  Provider:   Evalina Field, MD    Other Instructions  Monitor Blood Pressure Daily at Same time.

## 2022-03-03 DIAGNOSIS — M6281 Muscle weakness (generalized): Secondary | ICD-10-CM | POA: Diagnosis not present

## 2022-03-05 ENCOUNTER — Telehealth: Payer: Self-pay | Admitting: Cardiovascular Disease

## 2022-03-05 NOTE — Telephone Encounter (Signed)
Patient was asked to call and give physical therapy information for Jory Sims, NP.   [facility] # [phone] [place], [place]

## 2022-03-05 NOTE — Telephone Encounter (Signed)
Patient wanted to update Whitney Burton with PT information.

## 2022-03-07 ENCOUNTER — Other Ambulatory Visit: Payer: Self-pay

## 2022-03-07 DIAGNOSIS — M6281 Muscle weakness (generalized): Secondary | ICD-10-CM | POA: Diagnosis not present

## 2022-03-11 DIAGNOSIS — H9319 Tinnitus, unspecified ear: Secondary | ICD-10-CM | POA: Diagnosis not present

## 2022-03-11 DIAGNOSIS — K219 Gastro-esophageal reflux disease without esophagitis: Secondary | ICD-10-CM | POA: Diagnosis not present

## 2022-03-14 DIAGNOSIS — M6281 Muscle weakness (generalized): Secondary | ICD-10-CM | POA: Diagnosis not present

## 2022-03-18 ENCOUNTER — Other Ambulatory Visit: Payer: Self-pay

## 2022-03-18 MED ORDER — NEBIVOLOL HCL 20 MG PO TABS: 1.0000 | ORAL_TABLET | Freq: Every day | ORAL | 3 refills | Status: DC

## 2022-03-21 DIAGNOSIS — M6281 Muscle weakness (generalized): Secondary | ICD-10-CM | POA: Diagnosis not present

## 2022-03-24 ENCOUNTER — Other Ambulatory Visit: Payer: Self-pay | Admitting: Cardiovascular Disease

## 2022-03-24 DIAGNOSIS — I4821 Permanent atrial fibrillation: Secondary | ICD-10-CM

## 2022-03-24 NOTE — Telephone Encounter (Signed)
Prescription refill request for Eliquis received. Indication: Afib  Last office visit: 02/28/22 Joline Salt) Scr: 0.87 (01/19/22)  Age: 88 Weight: 70.5kg  Appropriate dose and refill sent to requested pharmacy.

## 2022-03-28 DIAGNOSIS — M6281 Muscle weakness (generalized): Secondary | ICD-10-CM | POA: Diagnosis not present

## 2022-03-31 NOTE — Progress Notes (Unsigned)
Cardiology Office Note:   Date:  04/01/2022  NAME:  Whitney Burton    MRN: [id number] DOB:  06-07-1933   PCP:  Donald Prose, MD  Cardiologist:  Evalina Field, MD  Electrophysiologist:  None   Referring MD: Donald Prose, MD   Chief Complaint  Patient presents with   Follow-up    3-4 months.   Shortness of Breath   Edema    Legs and ankles.   History of Present Illness:   Whitney Burton is a 88 y.o. female with a hx of perm Afib, HFpEF, HTN who presents for follow-up.  She overall seems to be doing much better.  No signs of volume overload.  She is taking torsemide as needed.  She is having low blood pressures in the afternoon.  We discussed stopping her morning losartan.  Her blood pressure is becoming better because she is losing weight.  She is not eating well at her retirement community.  She does not like the food.  She denies any chest pain.  She is short of breath but not that active.  She presents with her caregiver.  They are working on getting her more active.  Her heart rate can get low in the afternoons.  We discussed stopping her bisoprolol but she would like to make 1 change at a time.  She has no signs of volume overload.  She is not having any bleeding issues.  Overall she seems to be doing well.  Problem List 1. Permanent Atrial Fibrillation  -diagnosed 2019 [facility] [place] -failed DCCV and did not want to do rhythm medications  -CHADSVASC= 4 (age, sex, HTN) 2. Hypertension 3. HFpEF  -BNP 451 -EF 60-65%  Past Medical History: Past Medical History:  Diagnosis Date   Arrhythmia    Hypertension     Past Surgical History: Past Surgical History:  Procedure Laterality Date   HEMORRHOID SURGERY     PARTIAL HYSTERECTOMY      Current Medications: Current Meds  Medication Sig   acetaminophen (TYLENOL) 500 MG tablet 1 tablet as needed   Cholecalciferol (VITAMIN D3) 25 MCG (1000 UT) CAPS Take by mouth. Take 1000 IU daily   citalopram (CELEXA) 10 MG tablet Take 10 mg by  mouth daily.   ELIQUIS 5 MG TABS tablet TAKE 1 TABLET BY MOUTH TWICE A DAY   hydrOXYzine (ATARAX) 25 MG tablet Take 12.5-25 mg by mouth daily as needed.   moxifloxacin (VIGAMOX) 0.5 % ophthalmic solution Place 1 drop into the left eye 4 (four) times daily.   Nebivolol HCl 20 MG TABS Take 1 tablet (20 mg total) by mouth daily.   prednisoLONE acetate (PRED FORTE) 1 % ophthalmic suspension Place 1 drop into the left eye 4 (four) times daily.   sertraline (ZOLOFT) 100 MG tablet Take 50 mg by mouth daily. Started this week   sertraline (ZOLOFT) 50 MG tablet Take 50 mg by mouth 2 (two) times daily.   spironolactone (ALDACTONE) 25 MG tablet Take 1 tablet (25 mg total) by mouth daily. (Patient taking differently: Take 12.5 mg by mouth daily. Take 1/2 Tablet Daily)   TIADYLT ER 240 MG 24 hr capsule TAKE 1 CAPSULE BY MOUTH EVERY DAY   torsemide (DEMADEX) 20 MG tablet Take 1 tablet (20 mg total) by mouth as needed (Edema).   [DISCONTINUED] losartan (COZAAR) 50 MG tablet TAKE 1 TABLET BY MOUTH EVERY DAY     Allergies:    Ciprofloxacin hcl, Levofloxacin, Ondansetron hcl, and Furosemide   Social  History: Social History   Socioeconomic History   Marital status: Widowed    Spouse name: Not on file   Number of children: Not on file   Years of education: Not on file   Highest education level: Not on file  Occupational History   Not on file  Tobacco Use   Smoking status: Former   Smokeless tobacco: Never  Vaping Use   Vaping Use: Never used  Substance and Sexual Activity   Alcohol use: Not Currently   Drug use: Never   Sexual activity: Not on file  Other Topics Concern   Not on file  Social History Narrative   Widowed. Moved to [place] to be closer to daughter.    Social Determinants of Health   Financial Resource Strain: Not on file  Food Insecurity: Not on file  Transportation Needs: Not on file  Physical Activity: Not on file  Stress: Not on file  Social Connections: Not on file      Family History: The patient's family history includes Heart attack in her father.  ROS:   All other ROS reviewed and negative. Pertinent positives noted in the HPI.     EKGs/Labs/Other Studies Reviewed:   The following studies were personally reviewed by me today:  Recent Labs: 01/19/2022: ALT 21; BUN 17; Creatinine, Ser 0.87; Hemoglobin 15.8; Platelets 340; Potassium 4.5; Sodium 131; TSH 1.023   Recent Lipid Panel No results found for: "CHOL", "TRIG", "HDL", "CHOLHDL", "VLDL", "LDLCALC", "LDLDIRECT"  Physical Exam:   VS:  BP 124/70 (BP Location: Right Arm, Patient Position: Sitting, Cuff Size: Normal)   Pulse 68   Ht 5' (1.524 m)   Wt 154 lb (69.9 kg)   BMI 30.08 kg/m    Wt Readings from Last 3 Encounters:  04/01/22 154 lb (69.9 kg)  02/28/22 155 lb 6.4 oz (70.5 kg)  12/11/21 163 lb 12.8 oz (74.3 kg)    General: Well nourished, well developed, in no acute distress Head: Atraumatic, normal size  Eyes: PEERLA, EOMI  Neck: Supple, no JVD Endocrine: No thryomegaly Cardiac: Normal S1, S2; irregular rhythm Lungs: Clear to auscultation bilaterally, no wheezing, rhonchi or rales  Abd: Soft, nontender, no hepatomegaly  Ext: No edema, pulses 2+ Musculoskeletal: No deformities, BUE and BLE strength normal and equal Skin: Warm and dry, no rashes   Neuro: Alert and oriented to person, place, time, and situation, CNII-XII grossly intact, no focal deficits  Psych: Normal mood and affect   ASSESSMENT:   Whitney Burton is a 88 y.o. female who presents for the following: 1. Longstanding persistent atrial fibrillation ([place])   2. Physical deconditioning   3. SOB (shortness of breath)   4. Chronic diastolic heart failure ([place])   5. Essential hypertension     PLAN:   1. Longstanding persistent atrial fibrillation (HCC) -Rate controlled on diltiazem and bisoprolol.  We will likely need to reduce her bisoprolol at her next visit.  Continue Eliquis.  No bleeding issues.  2. Physical  deconditioning 3. SOB (shortness of breath) -I suspect her symptoms of shortness of breath are related to physical deconditioning.  No signs of volume overload.  Her weight is stable.  I have encouraged her to become more active.  4. Chronic diastolic heart failure ([place]) -Euvolemic on examination.  No signs of volume overload.  Clear lungs.  Take torsemide as needed.  5. Essential hypertension -Blood pressure is running low in the afternoons.  Stop losartan.  Continue Aldactone 12.5 mg daily, nebivolol 20 mg daily,  diltiazem.  See me back in 5 months.  Disposition: Return in about 5 months (around 08/31/2022).  Medication Adjustments/Labs and Tests Ordered: Current medicines are reviewed at length with the patient today.  Concerns regarding medicines are outlined above.  No orders of the defined types were placed in this encounter.  No orders of the defined types were placed in this encounter.   Patient Instructions  Medication Instructions:  STOP Losartan  *If you need a refill on your cardiac medications before your next appointment, please call your pharmacy*   Follow-Up: At [facility], you and your health needs are our priority.  As part of our continuing mission to provide you with exceptional heart care, we have created designated Provider Care Teams.  These Care Teams include your primary Cardiologist (physician) and Advanced Practice Providers (APPs -  Physician Assistants and Nurse Practitioners) who all work together to provide you with the care you need, when you need it.  We recommend signing up for the patient portal called "MyChart".  Sign up information is provided on this After Visit Summary.  MyChart is used to connect with patients for Virtual Visits (Telemedicine).  Patients are able to view lab/test results, encounter notes, upcoming appointments, etc.  Non-urgent messages can be sent to your provider as well.   To learn more about what you can do with  MyChart, go to [email or web address].    Your next appointment:   5 month(s)  The format for your next appointment:   In Person  Provider:   Evalina Field, MD            Time Spent with Patient: I have spent a total of 35 minutes with patient reviewing hospital notes, telemetry, EKGs, labs and examining the patient as well as establishing an assessment and plan that was discussed with the patient.  > 50% of time was spent in direct patient care.  Signed, Addison Naegeli. Audie Box, MD, [place]  [address], [place], [place] [address] [phone]  04/01/2022 10:36 AM

## 2022-04-01 ENCOUNTER — Encounter: Payer: Self-pay | Admitting: Cardiovascular Disease

## 2022-04-01 ENCOUNTER — Ambulatory Visit: Payer: Medicare Other | Attending: Cardiovascular Disease | Admitting: Cardiovascular Disease

## 2022-04-01 VITALS — BP 124/70 | HR 68 | Ht 60.0 in | Wt 154.0 lb

## 2022-04-01 DIAGNOSIS — R5381 Other malaise: Secondary | ICD-10-CM | POA: Diagnosis not present

## 2022-04-01 DIAGNOSIS — I10 Essential (primary) hypertension: Secondary | ICD-10-CM | POA: Diagnosis not present

## 2022-04-01 DIAGNOSIS — R0602 Shortness of breath: Secondary | ICD-10-CM | POA: Diagnosis not present

## 2022-04-01 DIAGNOSIS — I4811 Longstanding persistent atrial fibrillation: Secondary | ICD-10-CM

## 2022-04-01 DIAGNOSIS — I5032 Chronic diastolic (congestive) heart failure: Secondary | ICD-10-CM | POA: Diagnosis not present

## 2022-04-01 NOTE — Patient Instructions (Signed)
Medication Instructions:  STOP Losartan  *If you need a refill on your cardiac medications before your next appointment, please call your pharmacy*   Follow-Up: At [facility], you and your health needs are our priority.  As part of our continuing mission to provide you with exceptional heart care, we have created designated Provider Care Teams.  These Care Teams include your primary Cardiologist (physician) and Advanced Practice Providers (APPs -  Physician Assistants and Nurse Practitioners) who all work together to provide you with the care you need, when you need it.  We recommend signing up for the patient portal called "MyChart".  Sign up information is provided on this After Visit Summary.  MyChart is used to connect with patients for Virtual Visits (Telemedicine).  Patients are able to view lab/test results, encounter notes, upcoming appointments, etc.  Non-urgent messages can be sent to your provider as well.   To learn more about what you can do with MyChart, go to [email or web address].    Your next appointment:   5 month(s)  The format for your next appointment:   In Person  Provider:   Evalina Field, MD

## 2022-04-06 ENCOUNTER — Other Ambulatory Visit: Payer: Self-pay | Admitting: Cardiovascular Disease

## 2022-04-11 DIAGNOSIS — M6281 Muscle weakness (generalized): Secondary | ICD-10-CM | POA: Diagnosis not present

## 2022-04-14 DIAGNOSIS — B351 Tinea unguium: Secondary | ICD-10-CM | POA: Diagnosis not present

## 2022-04-15 DIAGNOSIS — K219 Gastro-esophageal reflux disease without esophagitis: Secondary | ICD-10-CM | POA: Diagnosis not present

## 2022-04-15 DIAGNOSIS — Z79899 Other long term (current) drug therapy: Secondary | ICD-10-CM | POA: Diagnosis not present

## 2022-04-15 DIAGNOSIS — Z0000 Encounter for general adult medical examination without abnormal findings: Secondary | ICD-10-CM | POA: Diagnosis not present

## 2022-04-15 DIAGNOSIS — I4891 Unspecified atrial fibrillation: Secondary | ICD-10-CM | POA: Diagnosis not present

## 2022-04-15 DIAGNOSIS — I10 Essential (primary) hypertension: Secondary | ICD-10-CM | POA: Diagnosis not present

## 2022-04-15 DIAGNOSIS — D6869 Other thrombophilia: Secondary | ICD-10-CM | POA: Diagnosis not present

## 2022-04-15 DIAGNOSIS — Z23 Encounter for immunization: Secondary | ICD-10-CM | POA: Diagnosis not present

## 2022-04-17 DIAGNOSIS — Z1231 Encounter for screening mammogram for malignant neoplasm of breast: Secondary | ICD-10-CM | POA: Diagnosis not present

## 2022-04-18 DIAGNOSIS — M6281 Muscle weakness (generalized): Secondary | ICD-10-CM | POA: Diagnosis not present

## 2022-04-25 DIAGNOSIS — R040 Epistaxis: Secondary | ICD-10-CM | POA: Diagnosis not present

## 2022-04-25 DIAGNOSIS — H9313 Tinnitus, bilateral: Secondary | ICD-10-CM | POA: Diagnosis not present

## 2022-04-25 DIAGNOSIS — Z7901 Long term (current) use of anticoagulants: Secondary | ICD-10-CM | POA: Diagnosis not present

## 2022-04-25 DIAGNOSIS — H903 Sensorineural hearing loss, bilateral: Secondary | ICD-10-CM | POA: Diagnosis not present

## 2022-04-25 DIAGNOSIS — K219 Gastro-esophageal reflux disease without esophagitis: Secondary | ICD-10-CM | POA: Diagnosis not present

## 2022-05-05 DIAGNOSIS — M8588 Other specified disorders of bone density and structure, other site: Secondary | ICD-10-CM | POA: Diagnosis not present

## 2022-05-05 DIAGNOSIS — M810 Age-related osteoporosis without current pathological fracture: Secondary | ICD-10-CM | POA: Diagnosis not present

## 2022-05-05 DIAGNOSIS — Z780 Asymptomatic menopausal state: Secondary | ICD-10-CM | POA: Diagnosis not present

## 2022-05-16 DIAGNOSIS — M6281 Muscle weakness (generalized): Secondary | ICD-10-CM | POA: Diagnosis not present

## 2022-05-23 DIAGNOSIS — M6281 Muscle weakness (generalized): Secondary | ICD-10-CM | POA: Diagnosis not present

## 2022-05-30 DIAGNOSIS — M6281 Muscle weakness (generalized): Secondary | ICD-10-CM | POA: Diagnosis not present

## 2022-06-13 DIAGNOSIS — M6281 Muscle weakness (generalized): Secondary | ICD-10-CM | POA: Diagnosis not present

## 2022-06-20 DIAGNOSIS — M6281 Muscle weakness (generalized): Secondary | ICD-10-CM | POA: Diagnosis not present

## 2022-06-24 DIAGNOSIS — Z961 Presence of intraocular lens: Secondary | ICD-10-CM | POA: Diagnosis not present

## 2022-06-24 DIAGNOSIS — H40023 Open angle with borderline findings, high risk, bilateral: Secondary | ICD-10-CM | POA: Diagnosis not present

## 2022-06-27 DIAGNOSIS — M6281 Muscle weakness (generalized): Secondary | ICD-10-CM | POA: Diagnosis not present

## 2022-07-02 DIAGNOSIS — Z23 Encounter for immunization: Secondary | ICD-10-CM | POA: Diagnosis not present

## 2022-07-02 DIAGNOSIS — D6869 Other thrombophilia: Secondary | ICD-10-CM | POA: Diagnosis not present

## 2022-07-02 DIAGNOSIS — I4821 Permanent atrial fibrillation: Secondary | ICD-10-CM | POA: Diagnosis not present

## 2022-07-02 DIAGNOSIS — E871 Hypo-osmolality and hyponatremia: Secondary | ICD-10-CM | POA: Diagnosis not present

## 2022-07-11 DIAGNOSIS — M6281 Muscle weakness (generalized): Secondary | ICD-10-CM | POA: Diagnosis not present

## 2022-07-18 DIAGNOSIS — M6281 Muscle weakness (generalized): Secondary | ICD-10-CM | POA: Diagnosis not present

## 2022-07-24 DIAGNOSIS — R1011 Right upper quadrant pain: Secondary | ICD-10-CM | POA: Diagnosis not present

## 2022-07-24 DIAGNOSIS — N390 Urinary tract infection, site not specified: Secondary | ICD-10-CM | POA: Diagnosis not present

## 2022-07-24 DIAGNOSIS — K5900 Constipation, unspecified: Secondary | ICD-10-CM | POA: Diagnosis not present

## 2022-07-24 DIAGNOSIS — J302 Other seasonal allergic rhinitis: Secondary | ICD-10-CM | POA: Diagnosis not present

## 2022-07-29 ENCOUNTER — Other Ambulatory Visit: Payer: Self-pay | Admitting: Family Medicine

## 2022-07-29 DIAGNOSIS — R1011 Right upper quadrant pain: Secondary | ICD-10-CM

## 2022-07-31 ENCOUNTER — Encounter (HOSPITAL_BASED_OUTPATIENT_CLINIC_OR_DEPARTMENT_OTHER): Payer: Self-pay | Admitting: Emergency Medicine

## 2022-07-31 ENCOUNTER — Other Ambulatory Visit: Payer: Self-pay

## 2022-07-31 ENCOUNTER — Emergency Department (HOSPITAL_BASED_OUTPATIENT_CLINIC_OR_DEPARTMENT_OTHER): Payer: Medicare Other

## 2022-07-31 ENCOUNTER — Other Ambulatory Visit (HOSPITAL_BASED_OUTPATIENT_CLINIC_OR_DEPARTMENT_OTHER): Payer: Self-pay

## 2022-07-31 ENCOUNTER — Emergency Department (HOSPITAL_BASED_OUTPATIENT_CLINIC_OR_DEPARTMENT_OTHER)
Admission: EM | Admit: 2022-07-31 | Discharge: 2022-07-31 | Disposition: A | Discharge status: Home / Self Care | Payer: Medicare Other | Attending: Emergency Medicine | Admitting: Emergency Medicine

## 2022-07-31 DIAGNOSIS — Z7901 Long term (current) use of anticoagulants: Secondary | ICD-10-CM | POA: Insufficient documentation

## 2022-07-31 DIAGNOSIS — R911 Solitary pulmonary nodule: Secondary | ICD-10-CM

## 2022-07-31 DIAGNOSIS — N200 Calculus of kidney: Secondary | ICD-10-CM | POA: Diagnosis not present

## 2022-07-31 DIAGNOSIS — R110 Nausea: Secondary | ICD-10-CM | POA: Diagnosis not present

## 2022-07-31 DIAGNOSIS — K7689 Other specified diseases of liver: Secondary | ICD-10-CM

## 2022-07-31 DIAGNOSIS — K5900 Constipation, unspecified: Secondary | ICD-10-CM | POA: Diagnosis not present

## 2022-07-31 DIAGNOSIS — N281 Cyst of kidney, acquired: Secondary | ICD-10-CM | POA: Diagnosis not present

## 2022-07-31 DIAGNOSIS — R630 Anorexia: Secondary | ICD-10-CM | POA: Diagnosis not present

## 2022-07-31 DIAGNOSIS — R1011 Right upper quadrant pain: Secondary | ICD-10-CM | POA: Diagnosis not present

## 2022-07-31 DIAGNOSIS — I7143 Infrarenal abdominal aortic aneurysm, without rupture: Secondary | ICD-10-CM

## 2022-07-31 DIAGNOSIS — R1013 Epigastric pain: Secondary | ICD-10-CM | POA: Diagnosis not present

## 2022-07-31 LAB — COMPREHENSIVE METABOLIC PANEL
ALT: 11 U/L (ref 0–44)
AST: 16 U/L (ref 15–41)
Albumin: 4.4 g/dL (ref 3.5–5.0)
Alkaline Phosphatase: 67 U/L (ref 38–126)
Anion gap: 10 (ref 5–15)
BUN: 9 mg/dL (ref 8–23)
CO2: 22 mmol/L (ref 22–32)
Calcium: 9.8 mg/dL (ref 8.9–10.3)
Chloride: 99 mmol/L (ref 98–111)
Creatinine, Ser: 0.63 mg/dL (ref 0.44–1.00)
GFR, Estimated: >60 mL/min (ref >60)
Glucose, Bld: 100 mg/dL — ABNORMAL HIGH (ref 70–99)
Potassium: 4.1 mmol/L (ref 3.5–5.1)
Sodium: 131 mmol/L — ABNORMAL LOW (ref 135–145)
Total Bilirubin: 0.7 mg/dL (ref 0.3–1.2)
Total Protein: 7.1 g/dL (ref 6.5–8.1)

## 2022-07-31 LAB — CBC WITH DIFFERENTIAL/PLATELET
Abs Immature Granulocytes: 0.03 10*3/uL (ref 0.00–0.07)
Basophils Absolute: 0 10*3/uL (ref 0.0–0.1)
Basophils Relative: 0 %
Eosinophils Absolute: 0 10*3/uL (ref 0.0–0.5)
Eosinophils Relative: 0 %
HCT: 44.3 % (ref 36.0–46.0)
Hemoglobin: 15.4 g/dL — ABNORMAL HIGH (ref 12.0–15.0)
Immature Granulocytes: 0 %
Lymphocytes Relative: 17 %
Lymphs Abs: 1.7 10*3/uL (ref 0.7–4.0)
MCH: 32.2 pg (ref 26.0–34.0)
MCHC: 34.8 g/dL (ref 30.0–36.0)
MCV: 92.5 fL (ref 80.0–100.0)
Monocytes Absolute: 0.9 10*3/uL (ref 0.1–1.0)
Monocytes Relative: 8 %
Neutro Abs: 7.5 10*3/uL (ref 1.7–7.7)
Neutrophils Relative %: 75 %
Platelets: 356 10*3/uL (ref 150–400)
RBC: 4.79 MIL/uL (ref 3.87–5.11)
RDW: 14.9 % (ref 11.5–15.5)
WBC: 10.1 10*3/uL (ref 4.0–10.5)
nRBC: 0 % (ref 0.0–0.2)

## 2022-07-31 MED ORDER — IOHEXOL 300 MG/ML  SOLN
100.0000 mL | Freq: Once | INTRAMUSCULAR | Status: AC | PRN
  Administered 2022-07-31: 80 mL via INTRAVENOUS

## 2022-07-31 NOTE — Discharge Instructions (Addendum)
You were seen for your abdominal pain and constipation in the emergency department. You were found to have a cyst on your liver, an abdominal aortic aneurysm, and a lung nodule.   If you do not have a primary care doctor you may follow-up with [facility] which is listed in this packet.  At home, please follow up with your primary care doctor about the lung nodule.  Vascular surgery about your abdominal aortic aneurysm.  And GI about your liver cyst.  Please continue to take your MiraLAX and eat a full diet.  Return immediately to the emergency department if you experience any of the following: Vomiting, severe pain, or any other concerning symptoms.    Thank you for visiting our Emergency Department. It was a pleasure taking care of you today.

## 2022-07-31 NOTE — ED Notes (Signed)
DC papers reviewed. No questions or concerns. No signs of distress. This RN stresses the importance of establishing primary care for lung nodule follow up- resource given. Consult for liver cysts-GI discussed. Consult for AAA discussed as well. Pt assisted to wheelchair and out to lobby. Appropriate measures for safety taken.

## 2022-07-31 NOTE — ED Provider Notes (Signed)
Whitney Burton Provider Note   CSN: [id number] Arrival date & time: 07/31/22  [id number]     History  Chief Complaint  Patient presents with   Constipation    Whitney Burton is a 89 y.o. female.  HPI   89 year old female presents emergency department concern for constipation.  Patient states this been ongoing for about the past month.  She has also been having epigastric/right upper quadrant abdominal pain.  Patient has tried multiple over-the-counter modalities for relief without significant improvement.  She continues to have a small bowel movement that she describes as the size of a teaspoon about once a week.  She has some generalized abdominal cramping but no real urgency.  Denies any blood in the stool.  No fever.  No genitourinary symptoms.  Mild nausea and decreased appetite.  Home Medications Prior to Admission medications   Medication Sig Start Date End Date Taking? Authorizing Provider  acetaminophen (TYLENOL) 500 MG tablet 1 tablet as needed    [provider]  Cholecalciferol (VITAMIN D3) 25 MCG (1000 UT) CAPS Take by mouth. Take 1000 IU daily    [provider]  citalopram (CELEXA) 10 MG tablet Take 10 mg by mouth daily. 02/21/22   [provider]  ELIQUIS 5 MG TABS tablet TAKE 1 TABLET BY MOUTH TWICE A DAY 03/24/22   O'Neal, Cassie Freer, MD  hydrOXYzine (ATARAX) 25 MG tablet Take 12.5-25 mg by mouth daily as needed. 02/21/22   [provider]  montelukast (SINGULAIR) 10 MG tablet Take 10 mg by mouth daily. 07/28/22   [provider]  moxifloxacin (VIGAMOX) 0.5 % ophthalmic solution Place 1 drop into the left eye 4 (four) times daily. 11/07/21   [provider]  Nebivolol HCl 20 MG TABS Take 1 tablet (20 mg total) by mouth daily. 03/18/22   O'NealCassie Freer, MD  pantoprazole (PROTONIX) 20 MG tablet Take 2 tablets (40 mg total) by mouth daily for 14 days. 01/19/22 02/02/22  Gareth Morgan,  MD  prednisoLONE acetate (PRED FORTE) 1 % ophthalmic suspension Place 1 drop into the left eye 4 (four) times daily. 11/07/21   [provider]  sertraline (ZOLOFT) 100 MG tablet Take 50 mg by mouth daily. Started this week    [provider]  sertraline (ZOLOFT) 50 MG tablet Take 50 mg by mouth 2 (two) times daily. 02/04/22   [provider]  spironolactone (ALDACTONE) 25 MG tablet Take 1 tablet (25 mg total) by mouth daily. Patient taking differently: Take 12.5 mg by mouth daily. Take 1/2 Tablet Daily 02/24/22   Geralynn Rile, MD  TIADYLT ER 240 MG 24 hr capsule TAKE 1 CAPSULE BY MOUTH EVERY DAY 04/07/22   O'Neal, Cassie Freer, MD  torsemide (DEMADEX) 20 MG tablet Take 1 tablet (20 mg total) by mouth as needed (Edema). 12/11/21   O'NealCassie Freer, MD      Allergies    Ciprofloxacin hcl, Levofloxacin, Ondansetron hcl, and Furosemide    Review of Systems   Review of Systems  Constitutional:  Positive for appetite change and fatigue. Negative for fever.  Respiratory:  Negative for shortness of breath.   Cardiovascular:  Negative for chest pain.  Gastrointestinal:  Positive for abdominal pain, constipation and nausea. Negative for abdominal distention, blood in stool, diarrhea and vomiting.  Skin:  Negative for rash.  Neurological:  Negative for headaches.    Physical Exam Updated Vital Signs BP (!) 140/74   Pulse 89  Temp 97.9 F (36.6 C) (Oral)   Resp 20   Ht 5' (1.524 m)   Wt 69.9 kg   SpO2 99%   BMI 30.08 kg/m  Physical Exam Vitals and nursing note reviewed.  Constitutional:      Appearance: Normal appearance.  HENT:     Head: Normocephalic.     Mouth/Throat:     Mouth: Mucous membranes are moist.  Cardiovascular:     Rate and Rhythm: Normal rate.  Pulmonary:     Effort: Pulmonary effort is normal. No respiratory distress.  Abdominal:     Palpations: Abdomen is soft.     Tenderness: There is no abdominal tenderness.      Comments: Palpable liver right upper quadrant, right upper quadrant tenderness and epigastric discomfort with deep palpation.  Bowel sounds increased, no significant distention.  Skin:    General: Skin is warm.  Neurological:     Mental Status: She is alert and oriented to person, place, and time. Mental status is at baseline.  Psychiatric:        Mood and Affect: Mood normal.     ED Results / Procedures / Treatments   Labs (all labs ordered are listed, but only abnormal results are displayed) Labs Reviewed  CBC WITH DIFFERENTIAL/PLATELET  COMPREHENSIVE METABOLIC PANEL    EKG None  Radiology No results found.  Procedures Procedures    Medications Ordered in ED Medications - No data to display  ED Course/ Medical Decision Making/ A&P Clinical Course as of 07/31/22 1529  Thu Jul 31, 2022  1513 Assumed care from Dr Dina Rich. 89 yo F with hx of breast cancer and liver cysts who presented with constipation x1 month. Mass on RUS [place]. Concern for possible mass effect causing her constipation so getting CT scan.  [RP]    Clinical Course User Index [RP] Fransico Meadow, MD                             Medical Decision Making Amount and/or Complexity of Data Reviewed Labs: ordered. Radiology: ordered.  Risk Prescription drug management.   89 year old female presents emergency department with upper abdominal pain, severe constipation.  Vitals are normal on arrival.  On exam she has a palpable firm mass in the right upper abdomen, questionable liver edge versus some other mass.  Blood work is reassuring.  Ultrasound shows unremarkable gallbladder but does show large cystic lesion with dependent debris.  Will plan for CT of the abdomen pelvis for further evaluation.  Patient signed out to Dr. Boykin Reaper pending results.        Final Clinical Impression(s) / ED Diagnoses Final diagnoses:  None    Rx / DC Orders ED Discharge Orders     None         Lorelle Gibbs, DO 07/31/22 1530

## 2022-07-31 NOTE — ED Notes (Signed)
Patient transported to [place]

## 2022-07-31 NOTE — ED Triage Notes (Addendum)
Constipation x 1 month. Reports intermittent small bowel movements. Using miralax, senna, magnesium citrate with minimal relief. Also reports intermittent RUQ pain and has been trying to get an appt for an [place].

## 2022-08-01 ENCOUNTER — Telehealth (HOSPITAL_BASED_OUTPATIENT_CLINIC_OR_DEPARTMENT_OTHER): Payer: Self-pay | Admitting: Family Medicine

## 2022-08-01 NOTE — Telephone Encounter (Signed)
Called pts daughter, after I got a VM, to see what services they needed, She thought it was pulmonology , but needed a PCP also   Called Pulmon on the 2nd floor to get an appt  And made appt for [place] also at the PCP office

## 2022-08-03 NOTE — Progress Notes (Unsigned)
Office Note     CC: Abdominal aortic aneurysm Requesting Provider:  Donald Prose, MD  HPI: Whitney Burton is a 89 y.o. (April 26, 1933) female presenting at the request of .Donald Prose, MD for evaluation of abdominal aortic aneurysm.  Whitney Burton was recently seen in the emergency department for right upper quadrant pain.  CT imaging demonstrated large liver cyst, as well as a small eccentric, infrarenal abdominal aneurysm.  A referral was sent to vascular surgery for follow-up.  Exam, Whitney Burton was doing well, accompanied by her daughter.  Her daughter was present via phone.  Has appreciated waxing waning right upper quadrant pain for the last couple of weeks.  She denies midline pain, back pain.  The pain has become more constant, but is bearable.  Denies previous history of aneurysm.  Has longstanding hypertension.  Surgical history includes hysterectomy, liver cyst excision 40 years ago.  The pt is not on a statin for cholesterol management.  The pt is not on a daily aspirin.   Other AC:  Eliquis The pt is on medication for hypertension.   The pt is not diabetic.  Tobacco hx:  -  Past Medical History:  Diagnosis Date   Arrhythmia    Hypertension     Past Surgical History:  Procedure Laterality Date   HEMORRHOID SURGERY     PARTIAL HYSTERECTOMY      Social History   Socioeconomic History   Marital status: Widowed    Spouse name: Not on file   Number of children: Not on file   Years of education: Not on file   Highest education level: Not on file  Occupational History   Not on file  Tobacco Use   Smoking status: Former   Smokeless tobacco: Never  Vaping Use   Vaping Use: Never used  Substance and Sexual Activity   Alcohol use: Not Currently   Drug use: Never   Sexual activity: Not on file  Other Topics Concern   Not on file  Social History Narrative   Widowed. Moved to [place] to be closer to daughter.    Social Determinants of Health   Financial Resource Strain: Not on file   Food Insecurity: Not on file  Transportation Needs: Not on file  Physical Activity: Not on file  Stress: Not on file  Social Connections: Not on file  Intimate Partner Violence: Not on file   Family History  Problem Relation Age of Onset   Heart attack Father     Current Outpatient Medications  Medication Sig Dispense Refill   acetaminophen (TYLENOL) 500 MG tablet 1 tablet as needed     Cholecalciferol (VITAMIN D3) 25 MCG (1000 UT) CAPS Take by mouth. Take 1000 IU daily     citalopram (CELEXA) 10 MG tablet Take 10 mg by mouth daily.     ELIQUIS 5 MG TABS tablet TAKE 1 TABLET BY MOUTH TWICE A DAY 180 tablet 1   hydrOXYzine (ATARAX) 25 MG tablet Take 12.5-25 mg by mouth daily as needed.     montelukast (SINGULAIR) 10 MG tablet Take 10 mg by mouth daily.     moxifloxacin (VIGAMOX) 0.5 % ophthalmic solution Place 1 drop into the left eye 4 (four) times daily.     Nebivolol HCl 20 MG TABS Take 1 tablet (20 mg total) by mouth daily. 90 tablet 3   pantoprazole (PROTONIX) 20 MG tablet Take 2 tablets (40 mg total) by mouth daily for 14 days. 28 tablet 0   prednisoLONE acetate (PRED FORTE) 1 %  ophthalmic suspension Place 1 drop into the left eye 4 (four) times daily.     sertraline (ZOLOFT) 100 MG tablet Take 50 mg by mouth daily. Started this week     sertraline (ZOLOFT) 50 MG tablet Take 50 mg by mouth 2 (two) times daily.     spironolactone (ALDACTONE) 25 MG tablet Take 1 tablet (25 mg total) by mouth daily. (Patient taking differently: Take 12.5 mg by mouth daily. Take 1/2 Tablet Daily) 90 tablet 3   TIADYLT ER 240 MG 24 hr capsule TAKE 1 CAPSULE BY MOUTH EVERY DAY 90 capsule 3   torsemide (DEMADEX) 20 MG tablet Take 1 tablet (20 mg total) by mouth as needed (Edema). 90 tablet 3   No current facility-administered medications for this visit.    Allergies  Allergen Reactions   Ciprofloxacin Hcl     Other reaction(s): Unknown   Levofloxacin     Other reaction(s): Unknown   Ondansetron  Hcl     Other reaction(s): constipation   Furosemide Itching    Itching after taking Lasix?     REVIEW OF SYSTEMS:  [X]$  denotes positive finding, [ ]$  denotes negative finding Cardiac  Comments:  Chest pain or chest pressure:    Shortness of breath upon exertion:    Short of breath when lying flat:    Irregular heart rhythm:        Vascular    Pain in calf, thigh, or hip brought on by ambulation:    Pain in feet at night that wakes you up from your sleep:     Blood clot in your veins:    Leg swelling:         Pulmonary    Oxygen at home:    Productive cough:     Wheezing:         Neurologic    Sudden weakness in arms or legs:     Sudden numbness in arms or legs:     Sudden onset of difficulty speaking or slurred speech:    Temporary loss of vision in one eye:     Problems with dizziness:         Gastrointestinal    Blood in stool:     Vomited blood:         Genitourinary    Burning when urinating:     Blood in urine:        Psychiatric    Major depression:         Hematologic    Bleeding problems:    Problems with blood clotting too easily:        Skin    Rashes or ulcers:        Constitutional    Fever or chills:      PHYSICAL EXAMINATION:  There were no vitals filed for this visit.  General:  WDWN in NAD; vital signs documented above Gait: Not observed HENT: WNL, normocephalic Pulmonary: normal non-labored breathing , without wheezing Cardiac: regular HR Abdomen: soft, NT, no masses Skin: without rashes Vascular Exam/Pulses:  Right Left  Radial 2+ (normal) 2+ (normal)  Ulnar    Femoral 2+ (normal) 2+ (normal)  Popliteal    DP 2+ (normal) 2+ (normal)  PT     Extremities: without ischemic changes, without Gangrene , without cellulitis; without open wounds;  Musculoskeletal: no muscle wasting or atrophy  Neurologic: A&O X 3;  No focal weakness or paresthesias are detected Psychiatric:  The pt has Normal affect.   Non-Invasive  Vascular  Imaging:       ASSESSMENT/PLAN: Whitney Burton is a 89 y.o. female presenting with aortic aneurysm which appears to be from a degenerated PAU.  Etiology is likely longstanding hypertension.  I had a long discussion with Jenascia and her daughters regarding the above.  She is asymptomatic from an aneurysm standpoint.  On physical exam she had some light tenderness to right upper quadrant palpation, but this was immediately over the large liver cyst that she has present.  I have no previous imaging to assess interval growth, and being that the aneurysm appears to be a degenerated PACU, I could not quote her an associated rupture risk.  The age of 76, Jermecia has 2 options -  one being operative repair via an endovascular stent graft, versus medical management with interval repeat imaging.  Zahniya chose not to pursue a watchman several years ago as she is not keen on surgery at the age of 61.  I discussed that unlike a watchman, if the aneurysm were to rupture, it would be life-threatening, and likely be a mortal event.  After discussing the risk and benefits of repair, Deserie and her daughters have elected to pursue medical management with 63-monthfollow-up.  She would like to have the liver cyst drained prior to moving forward with intervention. I think this is reasonable. My plan is to get a CT angio at that time in an effort to assess for interval change.  Even without change, I plan to offer operative repair again at that time, however if interval change occurs, I think operative repair will be even more pressing.  We discussed the signs and symptoms of rupture.  I asked her to seek immediate medical attention should any of these occur, [facility]    JBroadus John MD [facility] [phone]

## 2022-08-04 ENCOUNTER — Ambulatory Visit: Payer: Medicare Other | Admitting: Vascular Surgery

## 2022-08-04 ENCOUNTER — Encounter: Payer: Self-pay | Admitting: Vascular Surgery

## 2022-08-04 VITALS — BP 149/98 | HR 91 | Temp 97.2°F | Resp 14 | Ht 59.0 in | Wt 149.0 lb

## 2022-08-04 DIAGNOSIS — I7143 Infrarenal abdominal aortic aneurysm, without rupture: Secondary | ICD-10-CM | POA: Diagnosis not present

## 2022-08-05 ENCOUNTER — Ambulatory Visit: Payer: Medicare Other | Admitting: Gastroenterology

## 2022-08-05 ENCOUNTER — Encounter: Payer: Self-pay | Admitting: Gastroenterology

## 2022-08-05 VITALS — BP 128/78 | HR 52 | Ht 59.0 in | Wt 153.0 lb

## 2022-08-05 DIAGNOSIS — R1011 Right upper quadrant pain: Secondary | ICD-10-CM

## 2022-08-05 DIAGNOSIS — K7689 Other specified diseases of liver: Secondary | ICD-10-CM

## 2022-08-05 NOTE — Patient Instructions (Signed)
If your blood pressure at your visit was 140/90 or greater, please contact your primary care physician to follow up on this.  _______________________________________________________  If you are age 87 or older, your body mass index should be between 23-30. Your Body mass index is 30.9 kg/m. If this is out of the aforementioned range listed, please consider follow up with your Primary Care Provider.  If you are age 46 or younger, your body mass index should be between 19-25. Your Body mass index is 30.9 kg/m. If this is out of the aformentioned range listed, please consider follow up with your Primary Care Provider.   ________________________________________________________  The [facility] providers would like to encourage you to use [facility] to communicate with providers for non-urgent requests or questions.  Due to long hold times on the telephone, sending your provider a message by [facility] may be a faster and more efficient way to get a response.  Please allow 48 business hours for a response.  Please remember that this is for non-urgent requests.  _______________________________________________________  Thank you for entrusting me with your care and for choosing [facility], Dr. [place]aign Cellar

## 2022-08-05 NOTE — Progress Notes (Signed)
HPI :  89 year old female with a history of reported hepatic cyst in the past, A-fib on Eliquis, history of breast cancer, referred from the ED for new patient evaluation for abdominal pain.  She states she presented to the ED on February 15 for abdominal discomfort in her right upper abdomen.  She states she actually thought she was constipated at the time and perhaps that was causing her problems.  She usually moves her bowels every morning, denies any blood in her stools.  Generally in recent months has been eating well, denies any nausea or vomiting.  She denies any routine pains in her abdomen at baseline.  For the past few weeks she states she has been experiencing some intermittent pain in her right upper side.  She states this comes and goes, does not have any really clear triggers.  Perhaps when she is sitting up she feels it more than when she is lying down.  Denies any prandial component.  Denies any nausea vomiting, reflux, or dysphagia.  She states it is typically mild, rated 3-4 out of 10, can come and go without any clear frequency, lasts variable amount of time.  It is bothering her and she wants to know how to treat and get rid of it.  It looks like she was given a trial of Protonix 20 mg once daily to see if this would help and she states she took it for a few weeks and could not find any benefit at all.  She denies any family history of colon cancer or liver disease.  She reports in her 30s she underwent a partial hysterectomy and during that surgery she had a few liver cysts removed.  She is unclear on the details of this and why they removed the cysts and however they were bothering at the time if at all.  She does not recall having any symptoms from them.  In the emergency room she had an ultrasound of her right upper quadrant which showed that her gallbladder looked okay without any gallstones.  She was noted to have a very large hepatic cyst however, measuring 10.7 cm in size.   She then had a subsequent CT scan done which showed an exophytic cyst inferior to the margin of the right lobe, homogenous, seems simple, diameter 10.4 cm in largest dimension adjacent to surgical clips.  She was incidentally also noted to have an inferior AAA as well as pulmonary nodules.  She has been seen by vascular surgery and they have recommended conservative management and monitoring.  She states her last colonoscopy was more than 10 years ago, she had a Cologuard about 5 years ago that was negative and then stopped having routine colon cancer screening    Prior workup:  RUQ [place] 07/31/22: Gallbladder: No gallstones or wall thickening visualized. No sonographic Murphy sign noted by sonographer. Common bile duct: Diameter: 5.1 mm   Liver: No focal lesion identified. Within normal limits in parenchymal echogenicity. Portal vein is patent on color Doppler imaging with normal direction of blood flow towards the liver. Other: Large, thin walled cyst with dependent debris adjacent to the gallbladder inferiorly. It is difficult to determine what this is arising from. This measures 10.7 x 10.1 x 9.1 cm.   IMPRESSION: 1. No acute abnormality. 2. 10.7 cm cyst with dependent debris adjacent to the gallbladder. It is difficult to determine what this is arising from. Recommend further evaluation with a CT scan of the abdomen and pelvis with contrast.  CT scan abdomen / pelvis 07/31/22: FINDINGS: Lower chest: There is some linear opacity lung bases likely scar or atelectasis. No pleural effusion. Smaller nodules are seen. Example in the posterior aspect of the middle lobe on series 5, image 3 measures 5 mm. Few tiny areas elsewhere including 2-3 mm right lower lobe on image 8 in 3-4 mm left lower lobe on image 8. The heart is enlarged. Small pericardial effusion.   Hepatobiliary: Gallbladder is contracted. Patent portal vein. Small benign cysts are seen in the liver such as  segment 8 peripherally measuring 2.1 cm. Additional small focus near the gallbladder fossa as seen on series 3, image 26.   Exophytic from the inferior margin of the right hepatic lobe is a larger cystic lesion which is homogeneous in overall attenuation. No enhancing septa or nodules. Hounsfield units centrally of 17. Diameter of this lesion of 10.4 by 10.1 by 9.1 cm. There are adjacent surgical clips with streak artifact along the lateral margin of the area as well as in the adjacent abdominal wall. Please correlate with patient's surgical history. This abuts but is clearly separate from the gallbladder.   Pancreas: Unremarkable. No pancreatic ductal dilatation or surrounding inflammatory changes.   Spleen: Spleen is nonenlarged. Small splenule. There is small low-attenuation area centrally along the spleen best seen on coronal series 6, image 33 measuring 10 mm. This is not clearly a simple cyst. Axial image 14 of series 3. Recommend comparison to prior dedicated workup when appropriate.   Adrenals/Urinary Tract: Both the adrenal glands are focally thickened without discrete mass.   Both kidneys show some mild atrophy. The right kidney has a small benign-appearing Bosniak 1 cyst laterally measuring 12 mm. Few punctate areas in the left kidney. Extrarenal pelvis noted to the right kidney. The uterus has a normal course and caliber extending down to the bladder. Preserved contours of the urinary bladder.   Stomach/Bowel: On this non oral contrast exam, large bowel has a normal course and caliber with scattered stool. Left-sided colonic diverticula. Normal appendix in the right lower quadrant extending superior and medial to the cecum. Stomach and small bowel are nondilated.   Vascular/Lymphatic: Diffuse extensive calcified atherosclerotic plaque along the aorta. There is a saccular aneurysm extending anterior from the abdominal aorta, infrarenal. This has a penetrating ulcer  component. The aneurysm itself measures up to 0.4 cm in transverse dimension with some significant thrombus. The entirety of the aorta in this location would have diameter of 3.1 x 2.4 cm. No adjacent inflammatory stranding. No specific abnormal lymph node enlargement seen in the abdomen and pelvis.   Reproductive: Status post hysterectomy. No adnexal masses.   Other: No abdominal wall hernia or abnormality. No abdominopelvic ascites.   Musculoskeletal: Curvature of the spine. Scattered degenerative changes seen of the spine and pelvis.   IMPRESSION: 3.1 cm inferior abdominal aortic aneurysm but there is significant saccular component with mural thrombus and very thin noncalcified wall. Recommend referral to a vascular specialist. This recommendation follows ACR consensus guidelines: White Paper of the ACR Incidental Findings Committee II on Vascular Findings. J Am Coll Radiol 2013; 10:789-794.   No bowel obstruction, free air or free fluid. Colonic diverticula. Normal appendix.   Partially exophytic large simple appearing inferior right hepatic lobe cystic lesion with some adjacent surgical changes. Lesion itself does not require specific follow-up. Please correlate with the patient's surgical history specifically of the liver.   Small lung nodules. No follow-up needed if patient is low-risk (and has no  known or suspected primary neoplasm). Non-contrast chest CT can be considered in 12 months if patient is high-risk. This recommendation follows the consensus statement: Guidelines for Management of Incidental Pulmonary Nodules Detected on CT Images: From the Fleischner Society 2017; Radiology 2017; 284:228-243.    Echo 08/08/21: EF 55-60%, grade III DD   Past Medical History:  Diagnosis Date   A-fib ([place])    Anxiety    Arrhythmia    Breast cancer ([place])    Left Breast   Hypertension      Past Surgical History:  Procedure Laterality Date   HEMORRHOID SURGERY      LIVER SURGERY     found lumps in late 36s   PARTIAL HYSTERECTOMY     Family History  Problem Relation Age of Onset   Heart attack Father    Pancreatic cancer Brother    Colon cancer Neg Hx    Rectal cancer Neg Hx    Stomach cancer Neg Hx    Esophageal cancer Neg Hx    Liver cancer Neg Hx    Social History   Tobacco Use   Smoking status: Former   Smokeless tobacco: Never   Tobacco comments:    Smoked in teens  Vaping Use   Vaping Use: Never used  Substance Use Topics   Alcohol use: Yes    Alcohol/week: 1.0 standard drink of alcohol    Types: 1 Glasses of wine per week    Comment: Occassional   Drug use: Never   Current Outpatient Medications  Medication Sig Dispense Refill   acetaminophen (TYLENOL) 500 MG tablet 1 tablet as needed     Cholecalciferol (VITAMIN D3) 25 MCG (1000 UT) CAPS Take by mouth. Take 1000 IU daily     ELIQUIS 5 MG TABS tablet TAKE 1 TABLET BY MOUTH TWICE A DAY 180 tablet 1   hydrOXYzine (ATARAX) 25 MG tablet Take 12.5-25 mg by mouth daily as needed.     montelukast (SINGULAIR) 10 MG tablet Take 10 mg by mouth daily.     Nebivolol HCl 20 MG TABS Take 1 tablet (20 mg total) by mouth daily. 90 tablet 3   spironolactone (ALDACTONE) 25 MG tablet Take 1 tablet (25 mg total) by mouth daily. (Patient taking differently: Take 12.5 mg by mouth daily. Take 1/2 Tablet Daily) 90 tablet 3   TIADYLT ER 240 MG 24 hr capsule TAKE 1 CAPSULE BY MOUTH EVERY DAY 90 capsule 3   torsemide (DEMADEX) 20 MG tablet Take 1 tablet (20 mg total) by mouth as needed (Edema). 90 tablet 3   citalopram (CELEXA) 10 MG tablet Take 10 mg by mouth daily. (Patient not taking: Reported on 08/05/2022)     moxifloxacin (VIGAMOX) 0.5 % ophthalmic solution Place 1 drop into the left eye 4 (four) times daily. (Patient not taking: Reported on 08/05/2022)     prednisoLONE acetate (PRED FORTE) 1 % ophthalmic suspension Place 1 drop into the left eye 4 (four) times daily. (Patient not taking: Reported on  08/05/2022)     sertraline (ZOLOFT) 100 MG tablet Take 50 mg by mouth daily. Started this week (Patient not taking: Reported on 08/05/2022)     sertraline (ZOLOFT) 50 MG tablet Take 50 mg by mouth 2 (two) times daily. (Patient not taking: Reported on 08/05/2022)     No current facility-administered medications for this visit.   Allergies  Allergen Reactions   Ciprofloxacin Hcl     Other reaction(s): Unknown   Levofloxacin     Other  reaction(s): Unknown   Ondansetron Hcl     Other reaction(s): constipation   Furosemide Itching    Itching after taking Lasix?     Review of Systems: All systems reviewed and negative except where noted in HPI.    CT Abdomen Pelvis W Contrast  Result Date: 07/31/2022 CLINICAL DATA:  Acute abdominal pain. Mass on ultrasound. Constipation for a month. There is also remote history of a tumor removal potentially. EXAM: CT ABDOMEN AND PELVIS WITH CONTRAST TECHNIQUE: Multidetector CT imaging of the abdomen and pelvis was performed using the standard protocol following bolus administration of intravenous contrast. RADIATION DOSE REDUCTION: This exam was performed according to the departmental dose-optimization program which includes automated exposure control, adjustment of the mA and/or kV according to patient size and/or use of iterative reconstruction technique. CONTRAST:  37m OMNIPAQUE IOHEXOL 300 MG/ML  SOLN COMPARISON:  Ultrasound earlier 07/31/2022 FINDINGS: Lower chest: There is some linear opacity lung bases likely scar or atelectasis. No pleural effusion. Smaller nodules are seen. Example in the posterior aspect of the middle lobe on series 5, image 3 measures 5 mm. Few tiny areas elsewhere including 2-3 mm right lower lobe on image 8 in 3-4 mm left lower lobe on image 8. The heart is enlarged. Small pericardial effusion. Hepatobiliary: Gallbladder is contracted. Patent portal vein. Small benign cysts are seen in the liver such as segment 8 peripherally measuring  2.1 cm. Additional small focus near the gallbladder fossa as seen on series 3, image 26. Exophytic from the inferior margin of the right hepatic lobe is a larger cystic lesion which is homogeneous in overall attenuation. No enhancing septa or nodules. Hounsfield units centrally of 17. Diameter of this lesion of 10.4 by 10.1 by 9.1 cm. There are adjacent surgical clips with streak artifact along the lateral margin of the area as well as in the adjacent abdominal wall. Please correlate with patient's surgical history. This abuts but is clearly separate from the gallbladder. Pancreas: Unremarkable. No pancreatic ductal dilatation or surrounding inflammatory changes. Spleen: Spleen is nonenlarged. Small splenule. There is small low-attenuation area centrally along the spleen best seen on coronal series 6, image 33 measuring 10 mm. This is not clearly a simple cyst. Axial image 14 of series 3. Recommend comparison to prior dedicated workup when appropriate. Adrenals/Urinary Tract: Both the adrenal glands are focally thickened without discrete mass. Both kidneys show some mild atrophy. The right kidney has a small benign-appearing Bosniak 1 cyst laterally measuring 12 mm. Few punctate areas in the left kidney. Extrarenal pelvis noted to the right kidney. The uterus has a normal course and caliber extending down to the bladder. Preserved contours of the urinary bladder. Stomach/Bowel: On this non oral contrast exam, large bowel has a normal course and caliber with scattered stool. Left-sided colonic diverticula. Normal appendix in the right lower quadrant extending superior and medial to the cecum. Stomach and small bowel are nondilated. Vascular/Lymphatic: Diffuse extensive calcified atherosclerotic plaque along the aorta. There is a saccular aneurysm extending anterior from the abdominal aorta, infrarenal. This has a penetrating ulcer component. The aneurysm itself measures up to 0.4 cm in transverse dimension with some  significant thrombus. The entirety of the aorta in this location would have diameter of 3.1 x 2.4 cm. No adjacent inflammatory stranding. No specific abnormal lymph node enlargement seen in the abdomen and pelvis. Reproductive: Status post hysterectomy. No adnexal masses. Other: No abdominal wall hernia or abnormality. No abdominopelvic ascites. Musculoskeletal: Curvature of the spine. Scattered degenerative changes  seen of the spine and pelvis. IMPRESSION: 3.1 cm inferior abdominal aortic aneurysm but there is significant saccular component with mural thrombus and very thin noncalcified wall. Recommend referral to a vascular specialist. This recommendation follows ACR consensus guidelines: White Paper of the ACR Incidental Findings Committee II on Vascular Findings. J Am Coll Radiol 2013; 10:789-794. No bowel obstruction, free air or free fluid. Colonic diverticula. Normal appendix. Partially exophytic large simple appearing inferior right hepatic lobe cystic lesion with some adjacent surgical changes. Lesion itself does not require specific follow-up. Please correlate with the patient's surgical history specifically of the liver. Small lung nodules. No follow-up needed if patient is low-risk (and has no known or suspected primary neoplasm). Non-contrast chest CT can be considered in 12 months if patient is high-risk. This recommendation follows the consensus statement: Guidelines for Management of Incidental Pulmonary Nodules Detected on CT Images: From the Fleischner Society 2017; Radiology 2017; 284:228-243. Electronically Signed   By: Jill Side M.D.   On: 07/31/2022 15:16   US Abdomen Limited RUQ (LIVER/GB)  Result Date: 07/31/2022 CLINICAL DATA:  Epigastric abdominal pain constipation for the past month. EXAM: ULTRASOUND ABDOMEN LIMITED RIGHT UPPER QUADRANT COMPARISON:  None Available. FINDINGS: Gallbladder: No gallstones or wall thickening visualized. No sonographic Murphy sign noted by sonographer.  Common bile duct: Diameter: 5.1 mm Liver: No focal lesion identified. Within normal limits in parenchymal echogenicity. Portal vein is patent on color Doppler imaging with normal direction of blood flow towards the liver. Other: Large, thin walled cyst with dependent debris adjacent to the gallbladder inferiorly. It is difficult to determine what this is arising from. This measures 10.7 x 10.1 x 9.1 cm. IMPRESSION: 1. No acute abnormality. 2. 10.7 cm cyst with dependent debris adjacent to the gallbladder. It is difficult to determine what this is arising from. Recommend further evaluation with a CT scan of the abdomen and pelvis with contrast. Electronically Signed   By: Claudie Revering M.D.   On: 07/31/2022 13:20    Lab Results  Component Value Date   WBC 10.1 07/31/2022   HGB 15.4 (H) 07/31/2022   HCT 44.3 07/31/2022   MCV 92.5 07/31/2022   PLT 356 07/31/2022    Lab Results  Component Value Date   CREATININE 0.63 07/31/2022   BUN 9 07/31/2022   NA 131 (L) 07/31/2022   K 4.1 07/31/2022   CL 99 07/31/2022   CO2 22 07/31/2022    Lab Results  Component Value Date   ALT 11 07/31/2022   AST 16 07/31/2022   ALKPHOS 67 07/31/2022   BILITOT 0.7 07/31/2022      Physical Exam: BP 128/78   Pulse (!) 52   Ht 4' 11"$  (1.499 m)   Wt 153 lb (69.4 kg)   SpO2 97%   BMI 30.90 kg/m  Constitutional: Pleasant,well-developed, female in no acute distress. HEENT: Normocephalic and atraumatic. Conjunctivae are normal. No scleral icterus. Neck supple.  Cardiovascular: Normal rate, regular rhythm.  Pulmonary/chest: Effort normal and breath sounds normal. No wheezing, rales or rhonchi. Abdominal: Soft, nondistended, some TTP with palpable mass in RUQ. Marland Kitchen Extremities: no edema Lymphadenopathy: No cervical adenopathy noted. Neurological: Alert and oriented to person place and time. Skin: Skin is warm and dry. No rashes noted. Psychiatric: Normal mood and affect. Behavior is  normal.   ASSESSMENT: 89 y.o. female here for assessment of the following  1. Hepatic cyst   2. RUQ pain    Patient with a few weeks worth of abdominal pain  leading to ED visit.  Noted to have a palpable mass in her right upper quadrant and this correlates with a very large simple cyst seen in her liver.  Interestingly she had cystic lesions removed from her liver in her 65s during the hysterectomy, she has no recollection of why they did that or what kind of cyst they were.  The cyst appears to be near site of her prior cyst that was intervened upon.  She has no recent prior cross-sectional injury imaging of her abdomen that we have access to, she moved from [place] and does not think she has had any imaging in recent years.  The question is how long has the cyst been present for, particularly at this size, and if it is even causing any symptoms.  She denies feeling any palpable mass there in recent weeks and feels this may be new.  The cyst appears benign and simple, however if it is causing symptoms, given the size, we discussed how options for treatment would be historically surgery versus IR intervention.  Given her age, we would like to avoid surgery if at all possible.  Given she has had a trial of PPI without benefit, given she has pain at the site of the cyst, I think it may likely be the cause of her symptoms based on evaluation today.  I need to review her CT scan with radiology, IR, to determine if she would be a candidate for any intervention on their behalf.  If they think they can offer her something we will refer her to see them.  If not we may survey this over time however if it is causing her pain, we discussed that her other option would be surgery and again she is really not interested in pursuing that.  We will see what IR says first and then get back to her about her options.  She can use Tylenol as needed for pain otherwise.  She wants to avoid NSAIDs while on Eliquis and  is not interested in narcotics etc.  PLAN: - counseled in general on hepatic cysts, this appears benign although quite large - unclear how long  this has been present for but could be the cause of her symptoms given relation to exam findings - I will discuss her case with IR to determine if they can offer or would recommend any intervention on this, versus surgical evaluation which the patient wishes to avoid if at all possible.  If we do not intervene on this in some way, she will need close interval follow-up imaging , especially if symptomatic. I will get back to her once I discuss her case with IR. She agrees.  Jolly Mango, MD [facility]

## 2022-08-06 ENCOUNTER — Other Ambulatory Visit: Payer: Medicare Other

## 2022-08-06 ENCOUNTER — Telehealth: Payer: Self-pay | Admitting: Gastroenterology

## 2022-08-06 DIAGNOSIS — K7689 Other specified diseases of liver: Secondary | ICD-10-CM

## 2022-08-06 NOTE — Addendum Note (Signed)
Addended by: Yevette Edwards on: 08/06/2022 03:35 PM   Modules accepted: Orders

## 2022-08-06 NOTE — Telephone Encounter (Signed)
Spoke with Dr. Annamaria Boots of IR about this case and reviewed imaging as I do think the cyst may be causing her symptoms. They can offer her an aspiration / drainage and see if this helps. If it does help her pain, then they could do sclerotherapy for more definitive therapy.    [place] can you let the patient know I spoke with IR about her case and would like to refer her to them for management of hepatic cyst for drainage / aspiration to see if that helps her pain and then consider sclerotherapy pending her course. Can you let her know and place the referral? Thank you!

## 2022-08-06 NOTE — Telephone Encounter (Signed)
Called and spoke with patient regarding recommendations. Pt would like to proceed with IR referral. Pt is aware that the IR department will call her directly to set up her appt. I informed patient that we asked her to be scheduled with Dr. Annamaria Boots since he is aware of her case. Pt has been advised to call our office if she has not been scheduled soon. Pt verbalized understanding and had no concerns at the end of the call.  IR Eval and Management order in epic.

## 2022-08-07 ENCOUNTER — Telehealth: Payer: Self-pay | Admitting: Interventional Radiology

## 2022-08-07 NOTE — Telephone Encounter (Signed)
error 

## 2022-08-07 NOTE — Telephone Encounter (Signed)
IR appt with Dr. Annamaria Boots on Tuesday, 08/19/22 at 9:30 am.

## 2022-08-08 ENCOUNTER — Ambulatory Visit: Payer: Medicare Other | Admitting: Cardiovascular Disease

## 2022-08-10 NOTE — Progress Notes (Unsigned)
Cardiology Office Note:   Date:  08/12/2022  NAME:  Whitney Burton    MRN: [id number] DOB:  [birth year]   PCP:  Donald Prose, MD  Cardiologist:  Evalina Field, MD  Electrophysiologist:  None   Referring MD: Donald Prose, MD   Chief Complaint  Patient presents with   Follow-up    3-4 months.   History of Present Illness:   Whitney Burton is a 89 y.o. female with a hx of HFpEF, Afib who presents for follow-up.  She reports she is doing well.  Denies any chest pain.  Still getting short of breath.  Volume status is stable.  No extra fluid.  Weight is 151 pounds.  This is quite normal.  Seen in the emergency room.  Has an hepatic cyst.  This will need drainage.  Also has lung nodules.  This will be followed by CT.  Also found to have a AAA.  3.1 cm.  This will be followed.  Denies any major symptoms in office.  No chest pain.  Heart rate is well-controlled.  Problem List 1. Permanent Atrial Fibrillation  -diagnosed 2019 [facility] -failed DCCV and did not want to do rhythm medications  -CHADSVASC= 4 (age, sex, HTN) 2. Hypertension 3. HFpEF  -BNP 451 -EF 60-65% 4. AAA -31 mm 07/31/2022  Past Medical History: Past Medical History:  Diagnosis Date   A-fib ([place])    Anxiety    Arrhythmia    Breast cancer ([place])    Left Breast   Hypertension     Past Surgical History: Past Surgical History:  Procedure Laterality Date   HEMORRHOID SURGERY     LIVER SURGERY     found lumps in late 9s   PARTIAL HYSTERECTOMY      Current Medications: Current Meds  Medication Sig   acetaminophen (TYLENOL) 500 MG tablet 1 tablet as needed   Cholecalciferol (VITAMIN D3) 25 MCG (1000 UT) CAPS Take by mouth. Take 1000 IU daily   ELIQUIS 5 MG TABS tablet TAKE 1 TABLET BY MOUTH TWICE A DAY   Nebivolol HCl 20 MG TABS Take 1 tablet (20 mg total) by mouth daily.   spironolactone (ALDACTONE) 25 MG tablet Take 1 tablet (25 mg total) by mouth daily. (Patient taking differently: Take 12.5 mg by mouth daily.  Take 1/2 Tablet Daily)   TIADYLT ER 240 MG 24 hr capsule TAKE 1 CAPSULE BY MOUTH EVERY DAY   torsemide (DEMADEX) 20 MG tablet Take 1 tablet (20 mg total) by mouth as needed ([place]).   [DISCONTINUED] citalopram (CELEXA) 10 MG tablet Take 10 mg by mouth daily.   [DISCONTINUED] hydrOXYzine (ATARAX) 25 MG tablet Take 12.5-25 mg by mouth daily as needed.   [DISCONTINUED] montelukast (SINGULAIR) 10 MG tablet Take 10 mg by mouth daily.   [DISCONTINUED] moxifloxacin (VIGAMOX) 0.5 % ophthalmic solution Place 1 drop into the left eye 4 (four) times daily.   [DISCONTINUED] prednisoLONE acetate (PRED FORTE) 1 % ophthalmic suspension Place 1 drop into the left eye 4 (four) times daily.   [DISCONTINUED] sertraline (ZOLOFT) 100 MG tablet Take 50 mg by mouth daily. Started this week   [DISCONTINUED] sertraline (ZOLOFT) 50 MG tablet Take 50 mg by mouth 2 (two) times daily.     Allergies:    Ciprofloxacin hcl, Levofloxacin, Ondansetron hcl, and Furosemide   Social History: Social History   Socioeconomic History   Marital status: Widowed    Spouse name: Not on file   Number of children: 3  Years of education: Not on file   Highest education level: Not on file  Occupational History   Occupation: retired  Tobacco Use   Smoking status: Former   Smokeless tobacco: Never   Tobacco comments:    Smoked in teens  Media planner   Vaping Use: Never used  Substance and Sexual Activity   Alcohol use: Yes    Alcohol/week: 1.0 standard drink of alcohol    Types: 1 Glasses of wine per week    Comment: Occassional   Drug use: Never   Sexual activity: Not on file  Other Topics Concern   Not on file  Social History Narrative   Widowed. Moved to [place] to be closer to daughter.    Social Determinants of Health   Financial Resource Strain: Not on file  Food Insecurity: Not on file  Transportation Needs: Not on file  Physical Activity: Not on file  Stress: Not on file  Social Connections: Not on file      Family History: The patient's family history includes Heart attack in her father; Pancreatic cancer in her brother. There is no history of Colon cancer, Rectal cancer, Stomach cancer, Esophageal cancer, or Liver cancer.  ROS:   All other ROS reviewed and negative. Pertinent positives noted in the HPI.     EKGs/Labs/Other Studies Reviewed:   The following studies were personally reviewed by me today:  Recent Labs: 01/19/2022: TSH 1.023 07/31/2022: ALT 11; BUN 9; Creatinine, Ser 0.63; Hemoglobin 15.4; Platelets 356; Potassium 4.1; Sodium 131   Recent Lipid Panel No results found for: "CHOL", "TRIG", "HDL", "CHOLHDL", "VLDL", "LDLCALC", "LDLDIRECT"  Physical Exam:   VS:  BP 130/68 (BP Location: Right Arm, Patient Position: Sitting, Cuff Size: Normal)   Pulse 78   Ht '4\' 11"'$  (1.499 m)   Wt 151 lb (68.5 kg)   BMI 30.50 kg/m    Wt Readings from Last 3 Encounters:  08/12/22 151 lb (68.5 kg)  08/05/22 153 lb (69.4 kg)  08/04/22 149 lb (67.6 kg)    General: Well nourished, well developed, in no acute distress Head: Atraumatic, normal size  Eyes: PEERLA, EOMI  Neck: Supple, no JVD Endocrine: No thryomegaly Cardiac: Normal S1, S2; irregular rhythm, no murmurs Lungs: Clear to auscultation bilaterally, no wheezing, rhonchi or rales  Abd: Soft, nontender, no hepatomegaly  Ext: No edema, pulses 2+ Musculoskeletal: No deformities, BUE and BLE strength normal and equal Skin: Warm and dry, no rashes   Neuro: Alert and oriented to person, place, time, and situation, CNII-XII grossly intact, no focal deficits  Psych: Normal mood and affect   ASSESSMENT:   Whitney Burton is a 89 y.o. female who presents for the following: 1. Longstanding persistent atrial fibrillation ([place])   2. Chronic diastolic heart failure ([place])   3. Physical deconditioning   4. SOB (shortness of breath)   5. Infrarenal abdominal aortic aneurysm (AAA) without rupture (HCC)     PLAN:   1. Longstanding persistent  atrial fibrillation (HCC) -Rate controlled on diltiazem and bisoprolol.  On Eliquis.  No chest pains or trouble breathing.  5 mg is the appropriate dose based on her weight and serum creatinine.  2. Chronic diastolic heart failure (HCC) -Euvolemic on exam.  Taking torsemide as needed.  3. Physical deconditioning -Regular activity recommended.  4. SOB (shortness of breath) -Suspect this is due to deconditioning.  Volume status is quite acceptable.  5. Infrarenal abdominal aortic aneurysm (AAA) without rupture (HCC) -31 mm.  Yearly surveillance.  Disposition: Return in about 4 months (around 12/11/2022).  Medication Adjustments/Labs and Tests Ordered: Current medicines are reviewed at length with the patient today.  Concerns regarding medicines are outlined above.  No orders of the defined types were placed in this encounter.  No orders of the defined types were placed in this encounter.   Patient Instructions  Medication Instructions:  The current medical regimen is effective;  continue present plan and medications.  *If you need a refill on your cardiac medications before your next appointment, please call your pharmacy*   Follow-Up: At [facility], you and your health needs are our priority.  As part of our continuing mission to provide you with exceptional heart care, we have created designated Provider Care Teams.  These Care Teams include your primary Cardiologist (physician) and Advanced Practice Providers (APPs -  Physician Assistants and Nurse Practitioners) who all work together to provide you with the care you need, when you need it.  We recommend signing up for the patient portal called "MyChart".  Sign up information is provided on this After Visit Summary.  MyChart is used to connect with patients for Virtual Visits (Telemedicine).  Patients are able to view lab/test results, encounter notes, upcoming appointments, etc.  Non-urgent messages can be sent to  your provider as well.   To learn more about what you can do with MyChart, go to [email or web address].    Your next appointment:   4 month(s)  Provider:   Evalina Field, MD      Time Spent with Patient: I have spent a total of 25 minutes with patient reviewing hospital notes, telemetry, EKGs, labs and examining the patient as well as establishing an assessment and plan that was discussed with the patient.  > 50% of time was spent in direct patient care.  Signed, Addison Naegeli. Audie Box, MD, [place]  [address], [place], [place] [address] [phone]  08/12/2022 9:51 AM

## 2022-08-12 ENCOUNTER — Ambulatory Visit: Payer: Medicare Other | Attending: Cardiovascular Disease | Admitting: Cardiovascular Disease

## 2022-08-12 ENCOUNTER — Encounter: Payer: Self-pay | Admitting: Cardiovascular Disease

## 2022-08-12 VITALS — BP 130/68 | HR 78 | Ht 59.0 in | Wt 151.0 lb

## 2022-08-12 DIAGNOSIS — I4811 Longstanding persistent atrial fibrillation: Secondary | ICD-10-CM

## 2022-08-12 DIAGNOSIS — I5032 Chronic diastolic (congestive) heart failure: Secondary | ICD-10-CM

## 2022-08-12 DIAGNOSIS — I7143 Infrarenal abdominal aortic aneurysm, without rupture: Secondary | ICD-10-CM

## 2022-08-12 DIAGNOSIS — R0602 Shortness of breath: Secondary | ICD-10-CM | POA: Diagnosis not present

## 2022-08-12 DIAGNOSIS — R5381 Other malaise: Secondary | ICD-10-CM

## 2022-08-12 NOTE — Patient Instructions (Signed)
Medication Instructions:  The current medical regimen is effective;  continue present plan and medications.  *If you need a refill on your cardiac medications before your next appointment, please call your pharmacy*   Follow-Up: At [facility], you and your health needs are our priority.  As part of our continuing mission to provide you with exceptional heart care, we have created designated Provider Care Teams.  These Care Teams include your primary Cardiologist (physician) and Advanced Practice Providers (APPs -  Physician Assistants and Nurse Practitioners) who all work together to provide you with the care you need, when you need it.  We recommend signing up for the patient portal called "MyChart".  Sign up information is provided on this After Visit Summary.  MyChart is used to connect with patients for Virtual Visits (Telemedicine).  Patients are able to view lab/test results, encounter notes, upcoming appointments, etc.  Non-urgent messages can be sent to your provider as well.   To learn more about what you can do with MyChart, go to [email or web address].    Your next appointment:   4 month(s)  Provider:   Evalina Field, MD

## 2022-08-14 ENCOUNTER — Ambulatory Visit (HOSPITAL_BASED_OUTPATIENT_CLINIC_OR_DEPARTMENT_OTHER): Payer: Medicare Other | Admitting: Pulmonary Disease

## 2022-08-14 ENCOUNTER — Encounter (HOSPITAL_BASED_OUTPATIENT_CLINIC_OR_DEPARTMENT_OTHER): Payer: Self-pay | Admitting: Pulmonary Disease

## 2022-08-14 VITALS — BP 126/76 | HR 75 | Ht 59.0 in | Wt 152.8 lb

## 2022-08-14 DIAGNOSIS — R918 Other nonspecific abnormal finding of lung field: Secondary | ICD-10-CM | POA: Diagnosis not present

## 2022-08-14 NOTE — Progress Notes (Signed)
Subjective:    Patient ID: Whitney Burton, female    DOB: Nov 24, 1932, 89 y.o.   MRN: [id number]  HPI  Chief Complaint  Patient presents with   Consult    Pt is here due to a lung nodule being seen on recent imaging. States she has had problems with mucus production.   89 year old never smoker presents for evaluation of abnormal scans showing lung nodules.  She presented with right upper quadrant abdominal pain and right upper quadrant ultrasound showed a large hepatic cyst measuring 11 cm.  CT abdomen/pelvis clarified this to be a simple exophytic cyst adjacent to surgical clips.  She reports a history of partial hysterectomy in her 38s and during that surgery she had liver cyst removed. The CT scan also picked up tiny nodules in the right lower lobe, hence the referral. She was seen with GI and referred to IR since she continues to have right upper quadrant pain.  She reports cough with mucus production for 2 years.  She reports heaviness and decreased hearing in her left ear. She has been treated for allergies and reflux. She moved from [place] to [place] after the death of her husband to be closer to her family  I have reviewed GI and vascular consultations  PMH -atrial fibrillation on Eliquis Breast cancer status postlumpectomy and RT in her 68s Hepatic cyst   Significant tests/ events reviewed  07/2022 CT A/P 3.1 cm inferior AAA, small nodules, largest 5 mm right middle lobe, 2 to 3 mm right lower lobe, 3 to 4 mm left lower lobe   Past Medical History:  Diagnosis Date   A-fib ([place])    Anxiety    Arrhythmia    Breast cancer ([place])    Left Breast   Hypertension     Past Surgical History:  Procedure Laterality Date   HEMORRHOID SURGERY     LIVER SURGERY     found lumps in late 40s   PARTIAL HYSTERECTOMY     Allergies  Allergen Reactions   Ciprofloxacin Hcl     Other reaction(s): Unknown   Levofloxacin     Other reaction(s): Unknown    Ondansetron Hcl     Other reaction(s): constipation   Furosemide Itching    Itching after taking Lasix?    Social History   Socioeconomic History   Marital status: Widowed    Spouse name: Not on file   Number of children: 3   Years of education: Not on file   Highest education level: Not on file  Occupational History   Occupation: retired  Tobacco Use   Smoking status: Former   Smokeless tobacco: Never   Tobacco comments:    Smoked in teens  Media planner   Vaping Use: Never used  Substance and Sexual Activity   Alcohol use: Yes    Alcohol/week: 1.0 standard drink of alcohol    Types: 1 Glasses of wine per week    Comment: Occassional   Drug use: Never   Sexual activity: Not on file  Other Topics Concern   Not on file  Social History Narrative   Widowed. Moved to [place] to be closer to daughter.    Social Determinants of Health   Financial Resource Strain: Not on file  Food Insecurity: Not on file  Transportation Needs: Not on file  Physical Activity: Not on file  Stress: Not on file  Social Connections: Not on file  Intimate Partner Violence: Not on file  Family History  Problem Relation Age of Onset   Heart attack Father    Pancreatic cancer Brother    Colon cancer Neg Hx    Rectal cancer Neg Hx    Stomach cancer Neg Hx    Esophageal cancer Neg Hx    Liver cancer Neg Hx      Review of Systems Constitutional: negative for anorexia, fevers and sweats  Eyes: negative for irritation, redness and visual disturbance  Ears, nose, mouth, throat, and face: negative for earaches, epistaxis, nasal congestion and sore throat  Respiratory: negative for  dyspnea on exertion, sputum and wheezing  Cardiovascular: negative for chest pain, dyspnea, lower extremity edema, orthopnea, palpitations and syncope  Gastrointestinal: negative for  constipation, diarrhea, melena, nausea and vomiting  Genitourinary:negative for dysuria, frequency and hematuria   Hematologic/lymphatic: negative for bleeding, easy bruising and lymphadenopathy  Musculoskeletal:negative for arthralgias, muscle weakness and stiff joints  Neurological: negative for coordination problems, gait problems, headaches and weakness  Endocrine: negative for diabetic symptoms including polydipsia, polyuria and weight loss     Objective:   Physical Exam  Gen. Pleasant, well-nourished, elderly, in no distress, normal affect ENT - no pallor,icterus, no post nasal drip Neck: No JVD, no thyromegaly, no carotid bruits Lungs: no use of accessory muscles, no dullness to percussion, clear without rales or rhonchi  Cardiovascular: Rhythm regular, heart sounds  normal, no murmurs or gallops, no peripheral edema Abdomen: soft and non-tender, no hepatosplenomegaly, BS normal. Musculoskeletal: No deformities, no cyanosis or clubbing Neuro:  alert, non focal, uses walker for balance        Assessment & Plan:    Chronic cough with mucus production -she has been treated for allergies and reflux without any benefit. Upper airway cough syndrome seems to be the most likely etiology, will treat with for generation antihistaminic for 2 weeks with decongestant for at least 1 week and see if she has any symptomatic benefit

## 2022-08-14 NOTE — Assessment & Plan Note (Signed)
Largest is 5 mm in right middle lobe, likely postinflammatory Due to history of breast cancer will schedule short-term 56-monthfollow-up CT chest since she is already scheduled for a CT abdomen to follow-up on the AAA.  This will also help uKoreato visualize the whole chest for other nodules

## 2022-08-14 NOTE — Patient Instructions (Addendum)
  X CT chest wo con on may 31  Trial of chlorpheniramine 4 mg at bedtime x 3 weeks + phenylephrine 10 mg daytime  (combination [facility] brand 'sinus PE')

## 2022-08-19 ENCOUNTER — Ambulatory Visit
Admission: RE | Admit: 2022-08-19 | Discharge: 2022-08-19 | Disposition: A | Discharge status: Home / Self Care | Payer: Medicare Other | Source: Ambulatory Visit | Attending: Gastroenterology | Admitting: Gastroenterology

## 2022-08-19 ENCOUNTER — Other Ambulatory Visit (HOSPITAL_COMMUNITY): Payer: Self-pay | Admitting: Interventional Radiology

## 2022-08-19 DIAGNOSIS — K7689 Other specified diseases of liver: Secondary | ICD-10-CM

## 2022-08-19 NOTE — Consult Note (Signed)
Chief Complaint:  Right upper quadrant pain  Referring Physician(s): Armbruster,Steven P  History of Present Illness: Whitney Burton is a 89 y.o. female With a remote history of large hepatic cyst in the past.  She was recently seen in the emergency department for abdominal pain back in February.  She describes the pain as in the right upper quadrant and progresses throughout the day.  Pain is described as diffuse throughout the right upper quadrant, rated up to a 5 out of 10.  This vague abdominal pain comes and goes and does not have a clear precipitation.  No associated nausea, vomiting, or dysphagia.  Sometimes lying down and stretching out her abdomen alleviates some of her discomfort.  She occasionally takes Tylenol and was recently given a trial of Protonix.  She also reports back in her 64s she underwent partial hysterectomy and during the surgery had a few liver cyst removed but remains unclear of any details about the surgery.  Imaging workup included ultrasound as well as CT.  This demonstrates an exophytic large right inferior liver cyst measuring up to 10 cm.  Cyst appears simple without wall thickening or septation.  No visualized nodularity or enhancement.  She is here today to discuss treatment options from an interventional standpoint.  She has an excellent functional status for her age.  She does live independently and still drives.  She has an apartment within a retirement community.  Past Medical History:  Diagnosis Date   A-fib [facility])    Anxiety    Arrhythmia    Breast cancer ([place])    Left Breast   Hypertension     Past Surgical History:  Procedure Laterality Date   HEMORRHOID SURGERY     LIVER SURGERY     found lumps in late 40s   PARTIAL HYSTERECTOMY      Allergies: Ciprofloxacin hcl, Levofloxacin, Ondansetron hcl, and Furosemide  Medications: Prior to Admission medications   Medication Sig Start Date End Date Taking? Authorizing Provider   acetaminophen (TYLENOL) 500 MG tablet 1 tablet as needed    [provider]  Cholecalciferol (VITAMIN D3) 25 MCG (1000 UT) CAPS Take by mouth. Take 1000 IU daily    [provider]  ELIQUIS 5 MG TABS tablet TAKE 1 TABLET BY MOUTH TWICE A DAY 03/24/22   O'Neal, Cassie Freer, MD  Nebivolol HCl 20 MG TABS Take 1 tablet (20 mg total) by mouth daily. 03/18/22   O'NealCassie Freer, MD  spironolactone (ALDACTONE) 25 MG tablet Take 1 tablet (25 mg total) by mouth daily. Patient taking differently: Take 12.5 mg by mouth daily. Take 1/2 Tablet Daily 02/24/22   Geralynn Rile, MD  TIADYLT ER 240 MG 24 hr capsule TAKE 1 CAPSULE BY MOUTH EVERY DAY 04/07/22   O'Neal, Cassie Freer, MD  torsemide (DEMADEX) 20 MG tablet Take 1 tablet (20 mg total) by mouth as needed (Edema). 12/11/21   O'Neal, Cassie Freer, MD     Family History  Problem Relation Age of Onset   Heart attack Father    Pancreatic cancer Brother    Colon cancer Neg Hx    Rectal cancer Neg Hx    Stomach cancer Neg Hx    Esophageal cancer Neg Hx    Liver cancer Neg Hx     Social History   Socioeconomic History   Marital status: Widowed    Spouse name: Not on file   Number of children: 3   Years of education: Not on  file   Highest education level: Not on file  Occupational History   Occupation: retired  Tobacco Use   Smoking status: Former   Smokeless tobacco: Never   Tobacco comments:    Smoked in teens  Media planner   Vaping Use: Never used  Substance and Sexual Activity   Alcohol use: Yes    Alcohol/week: 1.0 standard drink of alcohol    Types: 1 Glasses of wine per week    Comment: Occassional   Drug use: Never   Sexual activity: Not on file  Other Topics Concern   Not on file  Social History Narrative   Widowed. Moved to [place] to be closer to daughter.    Social Determinants of Health   Financial Resource Strain: Not on file  Food Insecurity: Not on file  Transportation Needs: Not  on file  Physical Activity: Not on file  Stress: Not on file  Social Connections: Not on file     Review of Systems: A 12 point ROS discussed and pertinent positives are indicated in the HPI above.  All other systems are negative.  Review of Systems  Vital Signs: BP 136/62 (BP Location: Right Arm, Patient Position: Sitting, Cuff Size: Normal)   Pulse 80   Temp 97.6 F (36.4 C) (Oral)   Ht '4\' 11"'$  (1.499 m)   Wt 68.9 kg   SpO2 96% Comment: room air  BMI 30.70 kg/m      Physical Exam Constitutional:      General: She is not in acute distress.    Appearance: She is not ill-appearing.  Eyes:     General: No scleral icterus.    Conjunctiva/sclera: Conjunctivae normal.  Cardiovascular:     Rate and Rhythm: Normal rate. Rhythm irregular.     Heart sounds: No murmur heard. Pulmonary:     Effort: Pulmonary effort is normal.     Breath sounds: Normal breath sounds.  Abdominal:     General: Abdomen is flat. Bowel sounds are normal.     Palpations: Abdomen is soft.     Comments: Below the right subcostal margin the liver cyst is palpable eliciting only mild tenderness on exam.  This correlates with the CT and ultrasound.  Musculoskeletal:        General: No swelling.  Skin:    General: Skin is warm and dry.  Neurological:     General: No focal deficit present.     Mental Status: She is alert. Mental status is at baseline.  Psychiatric:        Mood and Affect: Mood normal.        Thought Content: Thought content normal.          Imaging: CT Abdomen Pelvis W Contrast  Result Date: 07/31/2022 CLINICAL DATA:  Acute abdominal pain. Mass on ultrasound. Constipation for a month. There is also remote history of a tumor removal potentially. EXAM: CT ABDOMEN AND PELVIS WITH CONTRAST TECHNIQUE: Multidetector CT imaging of the abdomen and pelvis was performed using the standard protocol following bolus administration of intravenous contrast. RADIATION DOSE REDUCTION: This exam  was performed according to the departmental dose-optimization program which includes automated exposure control, adjustment of the mA and/or kV according to patient size and/or use of iterative reconstruction technique. CONTRAST:  56m OMNIPAQUE IOHEXOL 300 MG/ML  SOLN COMPARISON:  Ultrasound earlier 07/31/2022 FINDINGS: Lower chest: There is some linear opacity lung bases likely scar or atelectasis. No pleural effusion. Smaller nodules are seen. Example in the posterior aspect of  the middle lobe on series 5, image 3 measures 5 mm. Few tiny areas elsewhere including 2-3 mm right lower lobe on image 8 in 3-4 mm left lower lobe on image 8. The heart is enlarged. Small pericardial effusion. Hepatobiliary: Gallbladder is contracted. Patent portal vein. Small benign cysts are seen in the liver such as segment 8 peripherally measuring 2.1 cm. Additional small focus near the gallbladder fossa as seen on series 3, image 26. Exophytic from the inferior margin of the right hepatic lobe is a larger cystic lesion which is homogeneous in overall attenuation. No enhancing septa or nodules. Hounsfield units centrally of 17. Diameter of this lesion of 10.4 by 10.1 by 9.1 cm. There are adjacent surgical clips with streak artifact along the lateral margin of the area as well as in the adjacent abdominal wall. Please correlate with patient's surgical history. This abuts but is clearly separate from the gallbladder. Pancreas: Unremarkable. No pancreatic ductal dilatation or surrounding inflammatory changes. Spleen: Spleen is nonenlarged. Small splenule. There is small low-attenuation area centrally along the spleen best seen on coronal series 6, image 33 measuring 10 mm. This is not clearly a simple cyst. Axial image 14 of series 3. Recommend comparison to prior dedicated workup when appropriate. Adrenals/Urinary Tract: Both the adrenal glands are focally thickened without discrete mass. Both kidneys show some mild atrophy. The right  kidney has a small benign-appearing Bosniak 1 cyst laterally measuring 12 mm. Few punctate areas in the left kidney. Extrarenal pelvis noted to the right kidney. The uterus has a normal course and caliber extending down to the bladder. Preserved contours of the urinary bladder. Stomach/Bowel: On this non oral contrast exam, large bowel has a normal course and caliber with scattered stool. Left-sided colonic diverticula. Normal appendix in the right lower quadrant extending superior and medial to the cecum. Stomach and small bowel are nondilated. Vascular/Lymphatic: Diffuse extensive calcified atherosclerotic plaque along the aorta. There is a saccular aneurysm extending anterior from the abdominal aorta, infrarenal. This has a penetrating ulcer component. The aneurysm itself measures up to 0.4 cm in transverse dimension with some significant thrombus. The entirety of the aorta in this location would have diameter of 3.1 x 2.4 cm. No adjacent inflammatory stranding. No specific abnormal lymph node enlargement seen in the abdomen and pelvis. Reproductive: Status post hysterectomy. No adnexal masses. Other: No abdominal wall hernia or abnormality. No abdominopelvic ascites. Musculoskeletal: Curvature of the spine. Scattered degenerative changes seen of the spine and pelvis. IMPRESSION: 3.1 cm inferior abdominal aortic aneurysm but there is significant saccular component with mural thrombus and very thin noncalcified wall. Recommend referral to a vascular specialist. This recommendation follows ACR consensus guidelines: White Paper of the ACR Incidental Findings Committee II on Vascular Findings. J Am Coll Radiol 2013; 10:789-794. No bowel obstruction, free air or free fluid. Colonic diverticula. Normal appendix. Partially exophytic large simple appearing inferior right hepatic lobe cystic lesion with some adjacent surgical changes. Lesion itself does not require specific follow-up. Please correlate with the patient's  surgical history specifically of the liver. Small lung nodules. No follow-up needed if patient is low-risk (and has no known or suspected primary neoplasm). Non-contrast chest CT can be considered in 12 months if patient is high-risk. This recommendation follows the consensus statement: Guidelines for Management of Incidental Pulmonary Nodules Detected on CT Images: From the Fleischner Society 2017; Radiology 2017; 284:228-243. Electronically Signed   By: Jill Side M.D.   On: 07/31/2022 15:16   US Abdomen Limited RUQ (  LIVER/GB)  Result Date: 07/31/2022 CLINICAL DATA:  Epigastric abdominal pain constipation for the past month. EXAM: ULTRASOUND ABDOMEN LIMITED RIGHT UPPER QUADRANT COMPARISON:  None Available. FINDINGS: Gallbladder: No gallstones or wall thickening visualized. No sonographic Murphy sign noted by sonographer. Common bile duct: Diameter: 5.1 mm Liver: No focal lesion identified. Within normal limits in parenchymal echogenicity. Portal vein is patent on color Doppler imaging with normal direction of blood flow towards the liver. Other: Large, thin walled cyst with dependent debris adjacent to the gallbladder inferiorly. It is difficult to determine what this is arising from. This measures 10.7 x 10.1 x 9.1 cm. IMPRESSION: 1. No acute abnormality. 2. 10.7 cm cyst with dependent debris adjacent to the gallbladder. It is difficult to determine what this is arising from. Recommend further evaluation with a CT scan of the abdomen and pelvis with contrast. Electronically Signed   By: Claudie Revering M.D.   On: 07/31/2022 13:20    Labs:  CBC: Recent Labs    01/19/22 0839 07/31/22 1315  WBC 6.7 10.1  HGB 15.8* 15.4*  HCT 45.0 44.3  PLT 340 356    COAGS: No results for input(s): "INR", "APTT" in the last 8760 hours.  BMP: Recent Labs    01/19/22 0839 07/31/22 1315  NA 131* 131*  K 4.5 4.1  CL 97* 99  CO2 25 22  GLUCOSE 103* 100*  BUN 17 9  CALCIUM 10.4* 9.8  CREATININE 0.87 0.63   GFRNONAA >60 >60    LIVER FUNCTION TESTS: Recent Labs    01/19/22 0839 07/31/22 1315  BILITOT 0.7 0.7  AST 16 16  ALT 21 11  ALKPHOS 51 67  PROT 6.9 7.1  ALBUMIN 4.4 4.4     Assessment and Plan:  10 cm right inferior exophytic hepatic cyst with associated right upper quadrant pain.  Treatment options reviewed from an interventional standpoint.  Initially, I would recommend ultrasound-guided cyst aspiration which would determine if the cyst is truly causing her symptoms.  Fluid from the cyst can also be sent for cytology for completeness.  After aspiration, we can schedule her for outpatient telehealth visit at 3 months to assess whether or not her right upper quadrant pain has resolved.  If her symptoms recur and the cyst reexpands by imaging, we also discussed placement of a drain catheter within the cyst followed by treatment with sclerotherapy.  She was accompanied by her daughter today.  All questions addressed.  They are clear on all the treatment options.  After our discussion, she would like to proceed with initial ultrasound cyst aspiration.  [facility] be [facility] without sedation.    Thank you for this interesting consult.  I greatly enjoyed meeting Aryon Kibe and look forward to participating in their care.  A copy of this report was sent to the requesting provider on this date.  Electronically Signed: Greggory Keen 08/19/2022, 9:50 AM   I spent a total of  40 Minutes   in face to face in clinical consultation, greater than 50% of which was counseling/coordinating care for This patient with a large right inferior liver cyst

## 2022-08-22 ENCOUNTER — Telehealth: Payer: Self-pay | Admitting: *Deleted

## 2022-08-22 NOTE — Telephone Encounter (Signed)
   Primary Cardiologist: Evalina Field, MD  Chart reviewed as part of pre-operative protocol coverage. Given past medical history and time since last visit, based on ACC/AHA guidelines, Whitney Burton would be at acceptable risk for the planned procedure without further cardiovascular testing.   Patient was advised that she develops new symptoms prior to surgery to contact our office to arrange a follow-up appointment.  He verbalized understanding.  Per office protocol and Dr. Audie Box, she may hold Eliquis for 4 days prior to procedure and should resume as soon as safe to do so per surgeon.   I will route this recommendation to the requesting party via Epic fax function and remove from pre-op pool.  Please call with questions.  Emmaline Life, NP-C  08/22/2022, 4:31 PM [address] Office [phone] Fax [phone]

## 2022-08-22 NOTE — Telephone Encounter (Signed)
Patient with diagnosis of afib on Eliquis for anticoagulation.    Procedure: ULTRASOUND GUIDED LIVER CYST ASPIRATION  Date of procedure: 08/29/22   CHA2DS2-VASc Score = 6   This indicates a 9.7% annual risk of stroke. The patient's score is based upon: CHF History: 1 HTN History: 1 Diabetes History: 0 Stroke History: 0 Vascular Disease History: 1 Age Score: 2 Gender Score: 1      CrCl 53 ml/min  MD is requesting a 4 day hold. Typically we would recommend 3 days for high bleed risk procedures. Will defer to Dr. Audie Box   **This guidance is not considered finalized until pre-operative APP has relayed final recommendations.**

## 2022-08-22 NOTE — Telephone Encounter (Signed)
   Pre-operative Risk Assessment    Patient Name: Whitney Burton  DOB: May 18, 1933 MRN: [id number]      Request for Surgical Clearance    Procedure:   ULTRASOUND GUIDED LIVER CYST ASPIRATION  Date of Surgery:  Clearance 08/29/22                                 Surgeon:  DR. Greggory Keen Surgeon's Group or Practice Name:  [facility] Phone number:   Fax number:  NO FAX NUMBER GIVEN, CAN SEND TO [place] EPIC   Type of Clearance Requested:   - Pharmacy:  Hold Apixaban (Eliquis) X'S 4 DAYS   Type of Anesthesia:   MODERATE   Additional requests/questions:    Signed, Jeanann Lewandowsky   08/22/2022, 7:50 AM

## 2022-08-27 ENCOUNTER — Other Ambulatory Visit: Payer: Self-pay | Admitting: Radiology

## 2022-08-27 DIAGNOSIS — K7689 Other specified diseases of liver: Secondary | ICD-10-CM

## 2022-08-28 ENCOUNTER — Other Ambulatory Visit: Payer: Self-pay | Admitting: Internal Medicine

## 2022-08-28 NOTE — Consult Note (Signed)
Chief Complaint: Hepatic cyst. Request is for hepatic cyst aspiration.   Referring Physician(s): Shick,Michael  Supervising Physician: Markus Daft  Patient Status: [facility] - Out-pt  History of Present Illness: Whitney Burton is a 89 y.o. female outpatient History of a fib (on eliquis) , HTN, breast cancer, CHF. Remote history of hepatic cyst s/p removal during hysterectomy in her 40's. Found to have a large hepatic cyst re-occurrence during a visit to the emergency department in Feb 2024 for RUQ pain. CT Abd pelvis from 2.15.24 reads Partially exophytic large simple appearing inferior right hepatic lobe cystic lesion with some adjacent surgical changes. The Patient was seen for consultation in the Interventional Radiology Clinic on 3.5.24 with IR Attending Dr. Daryll Brod along with her daughter. The patient was given several different treatment options including image guided hepatic cyst aspiration. After discussion  the Patient is agreeable to proceed with intervention.   Daughter at bedside. Currently without any significant complaints. Patient alert and laying in bed,calm. Denies any fevers, headache, chest pain, SOB, cough, abdominal pain, nausea, vomiting or bleeding. Return precautions and treatment recommendations and follow-up discussed with the patient and her daughter both who are agreeable with the plan.   Past Medical History:  Diagnosis Date   A-fib [facility])    Anxiety    Arrhythmia    Breast cancer ([place])    Left Breast   Hypertension     Past Surgical History:  Procedure Laterality Date   HEMORRHOID SURGERY     LIVER SURGERY     found lumps in late 40s   PARTIAL HYSTERECTOMY      Allergies: Ciprofloxacin hcl, Levofloxacin, Ondansetron hcl, and Furosemide  Medications: Prior to Admission medications   Medication Sig Start Date End Date Taking? Authorizing Provider  acetaminophen (TYLENOL) 500 MG tablet 1 tablet as needed    [provider]  Cholecalciferol  (VITAMIN D3) 25 MCG (1000 UT) CAPS Take by mouth. Take 1000 IU daily    [provider]  ELIQUIS 5 MG TABS tablet TAKE 1 TABLET BY MOUTH TWICE A DAY 03/24/22   O'Neal, Cassie Freer, MD  Nebivolol HCl 20 MG TABS Take 1 tablet (20 mg total) by mouth daily. 03/18/22   O'NealCassie Freer, MD  spironolactone (ALDACTONE) 25 MG tablet Take 1 tablet (25 mg total) by mouth daily. Patient taking differently: Take 12.5 mg by mouth daily. Take 1/2 Tablet Daily 02/24/22   Geralynn Rile, MD  TIADYLT ER 240 MG 24 hr capsule TAKE 1 CAPSULE BY MOUTH EVERY DAY 04/07/22   O'Neal, Cassie Freer, MD  torsemide (DEMADEX) 20 MG tablet Take 1 tablet (20 mg total) by mouth as needed (Edema). 12/11/21   O'Neal, Cassie Freer, MD     Family History  Problem Relation Age of Onset   Heart attack Father    Pancreatic cancer Brother    Colon cancer Neg Hx    Rectal cancer Neg Hx    Stomach cancer Neg Hx    Esophageal cancer Neg Hx    Liver cancer Neg Hx     Social History   Socioeconomic History   Marital status: Widowed    Spouse name: Not on file   Number of children: 3   Years of education: Not on file   Highest education level: Not on file  Occupational History   Occupation: retired  Tobacco Use   Smoking status: Former   Smokeless tobacco: Never   Tobacco comments:    Smoked in teens  Vaping Use   Vaping Use: Never used  Substance and Sexual Activity   Alcohol use: Yes    Alcohol/week: 1.0 standard drink of alcohol    Types: 1 Glasses of wine per week    Comment: Occassional   Drug use: Never   Sexual activity: Not on file  Other Topics Concern   Not on file  Social History Narrative   Widowed. Moved to [place] to be closer to daughter.    Social Determinants of Health   Financial Resource Strain: Not on file  Food Insecurity: Not on file  Transportation Needs: Not on file  Physical Activity: Not on file  Stress: Not on file  Social Connections: Not on file     Review of Systems: A 12 point ROS discussed and pertinent positives are indicated in the HPI above.  All other systems are negative.  Review of Systems  Constitutional:  Negative for fatigue and fever.  HENT:  Negative for congestion.   Respiratory:  Negative for cough and shortness of breath.   Gastrointestinal:  Negative for abdominal pain, diarrhea, nausea and vomiting.    Vital Signs: BP (!) 142/65 (BP Location: Right Arm)   Pulse 83   Temp 97.6 F (36.4 C) (Temporal)   Resp 16   Ht 4' 11.5" (1.511 m)   Wt 150 lb (68 kg)   SpO2 96%   BMI 29.79 kg/m   Physical Exam Vitals and nursing note reviewed.  Constitutional:      Appearance: She is well-developed.  HENT:     Head: Normocephalic and atraumatic.  Eyes:     Conjunctiva/sclera: Conjunctivae normal.  Cardiovascular:     Rate and Rhythm: Normal rate and regular rhythm.     Heart sounds: Normal heart sounds.  Pulmonary:     Effort: Pulmonary effort is normal.     Breath sounds: Normal breath sounds.  Musculoskeletal:        General: Normal range of motion.     Cervical back: Normal range of motion.  Skin:    General: Skin is warm.  Neurological:     Mental Status: She is alert and oriented to person, place, and time.     Imaging: CT Abdomen Pelvis W Contrast  Result Date: 07/31/2022 CLINICAL DATA:  Acute abdominal pain. Mass on ultrasound. Constipation for a month. There is also remote history of a tumor removal potentially. EXAM: CT ABDOMEN AND PELVIS WITH CONTRAST TECHNIQUE: Multidetector CT imaging of the abdomen and pelvis was performed using the standard protocol following bolus administration of intravenous contrast. RADIATION DOSE REDUCTION: This exam was performed according to the departmental dose-optimization program which includes automated exposure control, adjustment of the mA and/or kV according to patient size and/or use of iterative reconstruction technique. CONTRAST:  75mL OMNIPAQUE IOHEXOL  300 MG/ML  SOLN COMPARISON:  Ultrasound earlier 07/31/2022 FINDINGS: Lower chest: There is some linear opacity lung bases likely scar or atelectasis. No pleural effusion. Smaller nodules are seen. Example in the posterior aspect of the middle lobe on series 5, image 3 measures 5 mm. Few tiny areas elsewhere including 2-3 mm right lower lobe on image 8 in 3-4 mm left lower lobe on image 8. The heart is enlarged. Small pericardial effusion. Hepatobiliary: Gallbladder is contracted. Patent portal vein. Small benign cysts are seen in the liver such as segment 8 peripherally measuring 2.1 cm. Additional small focus near the gallbladder fossa as seen on series 3, image 26. Exophytic from the inferior margin of the right  hepatic lobe is a larger cystic lesion which is homogeneous in overall attenuation. No enhancing septa or nodules. Hounsfield units centrally of 17. Diameter of this lesion of 10.4 by 10.1 by 9.1 cm. There are adjacent surgical clips with streak artifact along the lateral margin of the area as well as in the adjacent abdominal wall. Please correlate with patient's surgical history. This abuts but is clearly separate from the gallbladder. Pancreas: Unremarkable. No pancreatic ductal dilatation or surrounding inflammatory changes. Spleen: Spleen is nonenlarged. Small splenule. There is small low-attenuation area centrally along the spleen best seen on coronal series 6, image 33 measuring 10 mm. This is not clearly a simple cyst. Axial image 14 of series 3. Recommend comparison to prior dedicated workup when appropriate. Adrenals/Urinary Tract: Both the adrenal glands are focally thickened without discrete mass. Both kidneys show some mild atrophy. The right kidney has a small benign-appearing Bosniak 1 cyst laterally measuring 12 mm. Few punctate areas in the left kidney. Extrarenal pelvis noted to the right kidney. The uterus has a normal course and caliber extending down to the bladder. Preserved contours  of the urinary bladder. Stomach/Bowel: On this non oral contrast exam, large bowel has a normal course and caliber with scattered stool. Left-sided colonic diverticula. Normal appendix in the right lower quadrant extending superior and medial to the cecum. Stomach and small bowel are nondilated. Vascular/Lymphatic: Diffuse extensive calcified atherosclerotic plaque along the aorta. There is a saccular aneurysm extending anterior from the abdominal aorta, infrarenal. This has a penetrating ulcer component. The aneurysm itself measures up to 0.4 cm in transverse dimension with some significant thrombus. The entirety of the aorta in this location would have diameter of 3.1 x 2.4 cm. No adjacent inflammatory stranding. No specific abnormal lymph node enlargement seen in the abdomen and pelvis. Reproductive: Status post hysterectomy. No adnexal masses. Other: No abdominal wall hernia or abnormality. No abdominopelvic ascites. Musculoskeletal: Curvature of the spine. Scattered degenerative changes seen of the spine and pelvis. IMPRESSION: 3.1 cm inferior abdominal aortic aneurysm but there is significant saccular component with mural thrombus and very thin noncalcified wall. Recommend referral to a vascular specialist. This recommendation follows ACR consensus guidelines: White Paper of the ACR Incidental Findings Committee II on Vascular Findings. J Am Coll Radiol 2013; 10:789-794. No bowel obstruction, free air or free fluid. Colonic diverticula. Normal appendix. Partially exophytic large simple appearing inferior right hepatic lobe cystic lesion with some adjacent surgical changes. Lesion itself does not require specific follow-up. Please correlate with the patient's surgical history specifically of the liver. Small lung nodules. No follow-up needed if patient is low-risk (and has no known or suspected primary neoplasm). Non-contrast chest CT can be considered in 12 months if patient is high-risk. This recommendation  follows the consensus statement: Guidelines for Management of Incidental Pulmonary Nodules Detected on CT Images: From the Fleischner Society 2017; Radiology 2017; 284:228-243. Electronically Signed   By: Jill Side M.D.   On: 07/31/2022 15:16   US Abdomen Limited RUQ (LIVER/GB)  Result Date: 07/31/2022 CLINICAL DATA:  Epigastric abdominal pain constipation for the past month. EXAM: ULTRASOUND ABDOMEN LIMITED RIGHT UPPER QUADRANT COMPARISON:  None Available. FINDINGS: Gallbladder: No gallstones or wall thickening visualized. No sonographic Murphy sign noted by sonographer. Common bile duct: Diameter: 5.1 mm Liver: No focal lesion identified. Within normal limits in parenchymal echogenicity. Portal vein is patent on color Doppler imaging with normal direction of blood flow towards the liver. Other: Large, thin walled cyst with dependent debris adjacent  to the gallbladder inferiorly. It is difficult to determine what this is arising from. This measures 10.7 x 10.1 x 9.1 cm. IMPRESSION: 1. No acute abnormality. 2. 10.7 cm cyst with dependent debris adjacent to the gallbladder. It is difficult to determine what this is arising from. Recommend further evaluation with a CT scan of the abdomen and pelvis with contrast. Electronically Signed   By: Claudie Revering M.D.   On: 07/31/2022 13:20    Labs:  CBC: Recent Labs    01/19/22 0839 07/31/22 1315  WBC 6.7 10.1  HGB 15.8* 15.4*  HCT 45.0 44.3  PLT 340 356    COAGS: No results for input(s): "INR", "APTT" in the last 8760 hours.  BMP: Recent Labs    01/19/22 0839 07/31/22 1315  NA 131* 131*  K 4.5 4.1  CL 97* 99  CO2 25 22  GLUCOSE 103* 100*  BUN 17 9  CALCIUM 10.4* 9.8  CREATININE 0.87 0.63  GFRNONAA >60 >60    LIVER FUNCTION TESTS: Recent Labs    01/19/22 0839 07/31/22 1315  BILITOT 0.7 0.7  AST 16 16  ALT 21 11  ALKPHOS 51 67  PROT 6.9 7.1  ALBUMIN 4.4 4.4      Assessment and Plan:  89 y.o. female outpatient History of  a fib (on eliquis) , HTN, breast cancer, CHF. Remote history of hepatic cyst s/p removal during hysterectomy in her 40's. Found to have a large hepatic cyst re-occurrence during a visit to the emergency department in Feb 2024 for RUQ pain. CT Abd pelvis from 2.15.24 reads Partially exophytic large simple appearing inferior right hepatic lobe cystic lesion with some adjacent surgical changes. The Patient was seen for consultation in the Interventional Radiology Clinic on 3.5.24 with IR Attending Dr. Daryll Brod along with her daughter. The patient was given several different treatment options including image guided hepatic cyst aspiration. After discussion  the Patient is agreeable to proceed with intervention.   Labs from 2.15.24 are within acceptable parameters. Last dose of eliquis on 3.12.24. Allergies include Cipro, levofloxacin. Ondansetron.  Patient has been NPO since midnight.   Risks and benefits discussed with the patient including bleeding, infection, damage to adjacent structures, bowel perforation/fistula connection, and sepsis.  All of the patient's and her daughter's questions were answered, patient and her daughter are both agreeable to proceed.  Consent signed and in chart.   Thank you for this interesting consult.  I greatly enjoyed meeting Whitney Burton and look forward to participating in their care.  A copy of this report was sent to the requesting provider on this date.  Electronically Signed: Jacqualine Mau, NP 08/29/2022, 1:11 PM   I spent a total of    15 Minutes in face to face in clinical consultation, greater than 50% of which was counseling/coordinating care for hepatic cyst aspiration

## 2022-08-29 ENCOUNTER — Other Ambulatory Visit (HOSPITAL_COMMUNITY): Payer: Self-pay | Admitting: Interventional Radiology

## 2022-08-29 ENCOUNTER — Ambulatory Visit (HOSPITAL_COMMUNITY)
Admission: RE | Admit: 2022-08-29 | Discharge: 2022-08-29 | Disposition: A | Discharge status: Home / Self Care | Payer: Medicare Other | Source: Ambulatory Visit | Attending: Interventional Radiology | Admitting: Interventional Radiology

## 2022-08-29 ENCOUNTER — Encounter (HOSPITAL_COMMUNITY): Payer: Self-pay

## 2022-08-29 ENCOUNTER — Other Ambulatory Visit: Payer: Self-pay

## 2022-08-29 DIAGNOSIS — Z7901 Long term (current) use of anticoagulants: Secondary | ICD-10-CM | POA: Insufficient documentation

## 2022-08-29 DIAGNOSIS — Z853 Personal history of malignant neoplasm of breast: Secondary | ICD-10-CM | POA: Insufficient documentation

## 2022-08-29 DIAGNOSIS — K7689 Other specified diseases of liver: Secondary | ICD-10-CM

## 2022-08-29 DIAGNOSIS — I110 Hypertensive heart disease with heart failure: Secondary | ICD-10-CM | POA: Diagnosis not present

## 2022-08-29 DIAGNOSIS — I4891 Unspecified atrial fibrillation: Secondary | ICD-10-CM | POA: Diagnosis not present

## 2022-08-29 DIAGNOSIS — I509 Heart failure, unspecified: Secondary | ICD-10-CM | POA: Diagnosis not present

## 2022-08-29 MED ORDER — FENTANYL CITRATE (PF) 100 MCG/2ML IJ SOLN
INTRAMUSCULAR | Status: AC
  Filled 2022-08-29: qty 2

## 2022-08-29 MED ORDER — SODIUM CHLORIDE 0.9 % IV SOLN
INTRAVENOUS | Status: DC
  Administered 2022-08-29: 250 mL via INTRAVENOUS

## 2022-08-29 MED ORDER — MIDAZOLAM HCL 2 MG/2ML IJ SOLN
INTRAMUSCULAR | Status: AC
  Filled 2022-08-29: qty 2

## 2022-08-29 MED ORDER — LIDOCAINE HCL (PF) 1 % IJ SOLN
5.0000 mL | Freq: Once | INTRAMUSCULAR | Status: AC
  Administered 2022-08-29: 5 mL via INTRADERMAL

## 2022-08-29 MED ORDER — FENTANYL CITRATE (PF) 100 MCG/2ML IJ SOLN
INTRAMUSCULAR | Status: AC | PRN
  Administered 2022-08-29: 50 ug via INTRAVENOUS

## 2022-08-29 MED ORDER — MIDAZOLAM HCL 2 MG/2ML IJ SOLN
INTRAMUSCULAR | Status: AC | PRN
  Administered 2022-08-29: 1 mg via INTRAVENOUS

## 2022-08-29 NOTE — Procedures (Signed)
Interventional Radiology Procedure Note  Procedure: US ASPIRATION RUQ LARGE HEPATIC CYST    Complications: None  Estimated Blood Loss:  0  Findings: 550CC DARK BROWN CYST FLD CYTO SENT     Tamera Punt, MD

## 2022-09-01 LAB — CYTOLOGY - NON PAP

## 2022-09-02 ENCOUNTER — Ambulatory Visit: Payer: Medicare Other | Admitting: Cardiovascular Disease

## 2022-09-05 ENCOUNTER — Emergency Department (HOSPITAL_BASED_OUTPATIENT_CLINIC_OR_DEPARTMENT_OTHER)
Admission: EM | Admit: 2022-09-05 | Discharge: 2022-09-05 | Disposition: A | Discharge status: Home / Self Care | Payer: Medicare Other | Attending: Emergency Medicine | Admitting: Emergency Medicine

## 2022-09-05 ENCOUNTER — Other Ambulatory Visit: Payer: Self-pay

## 2022-09-05 ENCOUNTER — Emergency Department (HOSPITAL_BASED_OUTPATIENT_CLINIC_OR_DEPARTMENT_OTHER): Payer: Medicare Other

## 2022-09-05 ENCOUNTER — Encounter (HOSPITAL_BASED_OUTPATIENT_CLINIC_OR_DEPARTMENT_OTHER): Payer: Self-pay | Admitting: Emergency Medicine

## 2022-09-05 DIAGNOSIS — S22080A Wedge compression fracture of T11-T12 vertebra, initial encounter for closed fracture: Secondary | ICD-10-CM | POA: Diagnosis not present

## 2022-09-05 DIAGNOSIS — R109 Unspecified abdominal pain: Secondary | ICD-10-CM | POA: Diagnosis not present

## 2022-09-05 DIAGNOSIS — S22089A Unspecified fracture of T11-T12 vertebra, initial encounter for closed fracture: Secondary | ICD-10-CM | POA: Insufficient documentation

## 2022-09-05 DIAGNOSIS — S3991XA Unspecified injury of abdomen, initial encounter: Secondary | ICD-10-CM | POA: Diagnosis not present

## 2022-09-05 DIAGNOSIS — I4891 Unspecified atrial fibrillation: Secondary | ICD-10-CM | POA: Insufficient documentation

## 2022-09-05 DIAGNOSIS — S0990XA Unspecified injury of head, initial encounter: Secondary | ICD-10-CM | POA: Diagnosis not present

## 2022-09-05 DIAGNOSIS — S29002A Unspecified injury of muscle and tendon of back wall of thorax, initial encounter: Secondary | ICD-10-CM | POA: Diagnosis present

## 2022-09-05 DIAGNOSIS — I10 Essential (primary) hypertension: Secondary | ICD-10-CM | POA: Insufficient documentation

## 2022-09-05 DIAGNOSIS — Y9389 Activity, other specified: Secondary | ICD-10-CM | POA: Diagnosis not present

## 2022-09-05 DIAGNOSIS — Z7901 Long term (current) use of anticoagulants: Secondary | ICD-10-CM | POA: Diagnosis not present

## 2022-09-05 DIAGNOSIS — W01198A Fall on same level from slipping, tripping and stumbling with subsequent striking against other object, initial encounter: Secondary | ICD-10-CM | POA: Diagnosis not present

## 2022-09-05 DIAGNOSIS — W19XXXA Unspecified fall, initial encounter: Secondary | ICD-10-CM

## 2022-09-05 DIAGNOSIS — S199XXA Unspecified injury of neck, initial encounter: Secondary | ICD-10-CM | POA: Diagnosis not present

## 2022-09-05 DIAGNOSIS — Z79899 Other long term (current) drug therapy: Secondary | ICD-10-CM | POA: Insufficient documentation

## 2022-09-05 DIAGNOSIS — R918 Other nonspecific abnormal finding of lung field: Secondary | ICD-10-CM

## 2022-09-05 DIAGNOSIS — M8588 Other specified disorders of bone density and structure, other site: Secondary | ICD-10-CM | POA: Diagnosis not present

## 2022-09-05 LAB — COMPREHENSIVE METABOLIC PANEL
ALT: 14 U/L (ref 0–44)
AST: 18 U/L (ref 15–41)
Albumin: 4 g/dL (ref 3.5–5.0)
Alkaline Phosphatase: 63 U/L (ref 38–126)
Anion gap: 7 (ref 5–15)
BUN: 14 mg/dL (ref 8–23)
CO2: 25 mmol/L (ref 22–32)
Calcium: 10.1 mg/dL (ref 8.9–10.3)
Chloride: 98 mmol/L (ref 98–111)
Creatinine, Ser: 0.78 mg/dL (ref 0.44–1.00)
GFR, Estimated: >60 mL/min (ref >60)
Glucose, Bld: 110 mg/dL — ABNORMAL HIGH (ref 70–99)
Potassium: 4.7 mmol/L (ref 3.5–5.1)
Sodium: 130 mmol/L — ABNORMAL LOW (ref 135–145)
Total Bilirubin: 0.8 mg/dL (ref 0.3–1.2)
Total Protein: 6.9 g/dL (ref 6.5–8.1)

## 2022-09-05 LAB — CBC WITH DIFFERENTIAL/PLATELET
Abs Immature Granulocytes: 0.06 10*3/uL (ref 0.00–0.07)
Basophils Absolute: 0 10*3/uL (ref 0.0–0.1)
Basophils Relative: 0 %
Eosinophils Absolute: 0 10*3/uL (ref 0.0–0.5)
Eosinophils Relative: 0 %
HCT: 47 % — ABNORMAL HIGH (ref 36.0–46.0)
Hemoglobin: 16.2 g/dL — ABNORMAL HIGH (ref 12.0–15.0)
Immature Granulocytes: 1 %
Lymphocytes Relative: 11 %
Lymphs Abs: 1 10*3/uL (ref 0.7–4.0)
MCH: 32.1 pg (ref 26.0–34.0)
MCHC: 34.5 g/dL (ref 30.0–36.0)
MCV: 93.3 fL (ref 80.0–100.0)
Monocytes Absolute: 0.7 10*3/uL (ref 0.1–1.0)
Monocytes Relative: 8 %
Neutro Abs: 7.7 10*3/uL (ref 1.7–7.7)
Neutrophils Relative %: 80 %
Platelets: 308 10*3/uL (ref 150–400)
RBC: 5.04 MIL/uL (ref 3.87–5.11)
RDW: 15.4 % (ref 11.5–15.5)
WBC: 9.6 10*3/uL (ref 4.0–10.5)
nRBC: 0 % (ref 0.0–0.2)

## 2022-09-05 MED ORDER — ACETAMINOPHEN 325 MG PO TABS
650.0000 mg | ORAL_TABLET | Freq: Once | ORAL | Status: AC
  Administered 2022-09-05: 650 mg via ORAL
  Filled 2022-09-05 (×2): qty 2

## 2022-09-05 MED ORDER — IOHEXOL 300 MG/ML  SOLN
100.0000 mL | Freq: Once | INTRAMUSCULAR | Status: AC | PRN
  Administered 2022-09-05: 80 mL via INTRAVENOUS

## 2022-09-05 NOTE — ED Notes (Signed)
Pt c/o pain per dr she may now have tylenol

## 2022-09-05 NOTE — ED Notes (Signed)
Pt states she is uncomfortable in bed and wants to sit in chair pt assisted up to chair

## 2022-09-05 NOTE — ED Triage Notes (Signed)
Pt reports while putting up a box she lost her balance and fell. Pt reports hitting her back and head. Pt taking eliquis.

## 2022-09-05 NOTE — Discharge Instructions (Signed)
Wear the brace while out of bed and follow-up with the spine doctor in the office.  Take Tylenol as needed for the pain.  Return to the ED with worsening pain, difficulty breathing, chest pain, worsening headache, confusion, unilateral weakness to her arms or legs.  Or any other concerns.

## 2022-09-05 NOTE — Progress Notes (Signed)
Orthopedic Tech Progress Note Patient Details:  Whitney Burton 04/07/1933 [id number] Called order into [employer] for TLSO brace to be applied/delivered Patient ID: Whitney Burton, female   DOB: April 26, 1933, 89 y.o.   MRN: [id number]  [name] 09/05/2022, 4:28 PM

## 2022-09-05 NOTE — ED Notes (Signed)
Waiting for ortho  to come  put on TLSO

## 2022-09-05 NOTE — ED Provider Notes (Signed)
[facility] Provider Note   CSN: Gibson:[id number] Arrival date & time: 09/05/22  1000     History  Chief Complaint  Patient presents with   Whitney Burton is a 89 y.o. female.  Patient with a history of atrial fibrillation on Eliquis, hypertension, recent drainage of hepatic cyst 2 weeks ago, aortic aneurysm presenting with fall.  States she was "throwing a pillow onto a shelf" and fell backwards striking her head and low back.  Denies any preceding dizziness or lightheadedness.  Did hit her head but did not lose consciousness.  Complains of pain to her low back.  No history of chronic back problems.  No focal weakness, numbness or tingling.  No bowel or bladder incontinence.  No chest pain or shortness of breath.  No history of IV drug abuse or cancer. States she is doing well since having hepatic cyst drained 2 weeks ago but feels her abdomen is getting more distended.  Denies any abdominal pain or vomiting.  No fever.  Does not feel dizzy or lightheaded currently.  Took Tylenol at home with partial relief.  The history is provided by the patient and a relative.  Fall Pertinent negatives include no abdominal pain, no headaches and no shortness of breath.       Home Medications Prior to Admission medications   Medication Sig Start Date End Date Taking? Authorizing Provider  acetaminophen (TYLENOL) 500 MG tablet 1 tablet as needed    [provider]  Cholecalciferol (VITAMIN D3) 25 MCG (1000 UT) CAPS Take by mouth. Take 1000 IU daily    [provider]  ELIQUIS 5 MG TABS tablet TAKE 1 TABLET BY MOUTH TWICE A DAY 03/24/22   O'Neal, Cassie Freer, MD  Nebivolol HCl 20 MG TABS Take 1 tablet (20 mg total) by mouth daily. 03/18/22   O'NealCassie Freer, MD  spironolactone (ALDACTONE) 25 MG tablet Take 1 tablet (25 mg total) by mouth daily. Patient taking differently: Take 12.5 mg by mouth daily. Take 1/2 Tablet Daily 02/24/22    Geralynn Rile, MD  TIADYLT ER 240 MG 24 hr capsule TAKE 1 CAPSULE BY MOUTH EVERY DAY 04/07/22   O'Neal, Cassie Freer, MD  torsemide (DEMADEX) 20 MG tablet Take 1 tablet (20 mg total) by mouth as needed (Edema). 12/11/21   O'NealCassie Freer, MD      Allergies    Ciprofloxacin hcl, Levofloxacin, Ondansetron hcl, and Furosemide    Review of Systems   Review of Systems  Constitutional:  Negative for activity change, appetite change and fever.  HENT:  Negative for congestion and rhinorrhea.   Respiratory:  Negative for cough, chest tightness and shortness of breath.   Gastrointestinal:  Negative for abdominal pain, nausea and vomiting.  Genitourinary:  Negative for dysuria.  Musculoskeletal:  Positive for arthralgias, back pain and myalgias.  Skin:  Negative for rash.  Neurological:  Negative for dizziness, weakness and headaches.   all other systems are negative except as noted in the HPI and PMH.    Physical Exam Updated Vital Signs BP 134/75 (BP Location: Right Arm)   Pulse 86   Temp 97.6 F (36.4 C) (Oral)   Resp 20   SpO2 97%  Physical Exam Vitals and nursing note reviewed.  Constitutional:      General: She is not in acute distress.    Appearance: She is well-developed.  HENT:     Head: Normocephalic and atraumatic.  Mouth/Throat:     Pharynx: No oropharyngeal exudate.  Eyes:     Conjunctiva/sclera: Conjunctivae normal.     Pupils: Pupils are equal, round, and reactive to light.  Neck:     Comments: No midline C-spine tenderness Cardiovascular:     Rate and Rhythm: Normal rate. Rhythm irregular.     Heart sounds: Normal heart sounds. No murmur heard. Pulmonary:     Effort: Pulmonary effort is normal. No respiratory distress.     Breath sounds: Normal breath sounds.  Chest:     Chest wall: No tenderness.  Abdominal:     Palpations: Abdomen is soft.     Tenderness: There is abdominal tenderness. There is no guarding or rebound.  Musculoskeletal:         General: Tenderness present. Normal range of motion.     Cervical back: Normal range of motion and neck supple.     Comments: Chaperone present due to the NT.  There is diffuse tenderness to palpation of the lumbar spine without step-off.  No ecchymosis or skin deformity.  5/5 strength in bilateral lower extremities. Ankle plantar and dorsiflexion intact. Great toe extension intact bilaterally. +2 DP and PT pulses.  Skin:    General: Skin is warm.  Neurological:     Mental Status: She is alert and oriented to person, place, and time.     Cranial Nerves: No cranial nerve deficit.     Motor: No abnormal muscle tone.     Coordination: Coordination normal.     Comments:  5/5 strength throughout. CN 2-12 intact.Equal grip strength.   Psychiatric:        Behavior: Behavior normal.    ED Results / Procedures / Treatments   Labs (all labs ordered are listed, but only abnormal results are displayed) Labs Reviewed  CBC WITH DIFFERENTIAL/PLATELET - Abnormal; Notable for the following components:      Result Value   Hemoglobin 16.2 (*)    HCT 47.0 (*)    All other components within normal limits  COMPREHENSIVE METABOLIC PANEL - Abnormal; Notable for the following components:   Sodium 130 (*)    Glucose, Bld 110 (*)    All other components within normal limits    EKG EKG Interpretation  Date/Time:  Friday September 05 2022 10:12:43 EDT Ventricular Rate:  72 PR Interval:    QRS Duration: 94 QT Interval:  374 QTC Calculation: 410 R Axis:   253 Text Interpretation: Atrial fibrillation Anterior infarct, old No significant change was found Confirmed by Ezequiel Essex [phone]) on 09/05/2022 10:22:50 AM  Radiology CT Head Wo Contrast  Result Date: 09/05/2022 CLINICAL DATA:  Neck trauma (Age >= 65y); Head trauma, moderate-severe EXAM: CT HEAD WITHOUT CONTRAST CT CERVICAL SPINE WITHOUT CONTRAST CT LUMBAR SPINE WITHOUT CONTRAST TECHNIQUE: Multidetector CT imaging of the head, cervical,  lumbar spine was performed following the standard protocol without intravenous contrast. Multiplanar CT image reconstructions of the cervical spine were also generated. RADIATION DOSE REDUCTION: This exam was performed according to the departmental dose-optimization program which includes automated exposure control, adjustment of the mA and/or kV according to patient size and/or use of iterative reconstruction technique. COMPARISON:  CT abdomen/pelvis February 15, 24. FINDINGS: CT HEAD FINDINGS Brain: No evidence of acute infarction, hemorrhage, hydrocephalus, extra-axial collection or mass lesion/mass effect. Patchy white matter hypodensities, nonspecific but compatible with chronic microvascular ischemic change. Cerebral atrophy. Vascular: Calcific atherosclerosis. Skull: No acute fracture. Sinuses/Orbits: Clear sinuses.  No acute orbital findings. Other: No mastoid effusions. CT  CERVICAL SPINE FINDINGS Alignment: Mild anterolisthesis of C7 on T1, likely degenerative given facet arthropathy. Otherwise, no substantial sagittal subluxation. Skull base and vertebrae: No evidence fracture. Soft tissues and spinal canal: No prevertebral fluid or swelling. No visible canal hematoma. Disc levels: Multilevel facet and uncovertebral hypertrophy without significant Upper chest: Visualized lung apices are clear. CT LUMBAR SPINE FINDINGS Alignment: No substantial sagittal subluxation. Skull base and vertebrae: Acute or subacute T12 superior endplate fracture with 624THL height loss and 4 mm of bony retropulsion. Osteopenia. Disc levels: Multilevel degenerative change without evidence of high-grade bony canal or foraminal stenosis. Paraspinal: Partially imaged large hepatic cystic lesion, better characterized on prior CT abdomen/pelvis. Atherosclerosis. IMPRESSION: CT lumbar spine: Acute or subacute T12 superior endplate fracture with 624THL height loss and 4 mm of bony retropulsion, new since July 31, 2022. CT cervical spine:  No evidence of acute fracture or traumatic malalignment. CT head: No evidence of acute intracranial abnormality. Electronically Signed   By: Margaretha Sheffield M.D.   On: 09/05/2022 12:49   CT Cervical Spine Wo Contrast  Result Date: 09/05/2022 CLINICAL DATA:  Neck trauma (Age >= 65y); Head trauma, moderate-severe EXAM: CT HEAD WITHOUT CONTRAST CT CERVICAL SPINE WITHOUT CONTRAST CT LUMBAR SPINE WITHOUT CONTRAST TECHNIQUE: Multidetector CT imaging of the head, cervical, lumbar spine was performed following the standard protocol without intravenous contrast. Multiplanar CT image reconstructions of the cervical spine were also generated. RADIATION DOSE REDUCTION: This exam was performed according to the departmental dose-optimization program which includes automated exposure control, adjustment of the mA and/or kV according to patient size and/or use of iterative reconstruction technique. COMPARISON:  CT abdomen/pelvis February 15, 24. FINDINGS: CT HEAD FINDINGS Brain: No evidence of acute infarction, hemorrhage, hydrocephalus, extra-axial collection or mass lesion/mass effect. Patchy white matter hypodensities, nonspecific but compatible with chronic microvascular ischemic change. Cerebral atrophy. Vascular: Calcific atherosclerosis. Skull: No acute fracture. Sinuses/Orbits: Clear sinuses.  No acute orbital findings. Other: No mastoid effusions. CT CERVICAL SPINE FINDINGS Alignment: Mild anterolisthesis of C7 on T1, likely degenerative given facet arthropathy. Otherwise, no substantial sagittal subluxation. Skull base and vertebrae: No evidence fracture. Soft tissues and spinal canal: No prevertebral fluid or swelling. No visible canal hematoma. Disc levels: Multilevel facet and uncovertebral hypertrophy without significant Upper chest: Visualized lung apices are clear. CT LUMBAR SPINE FINDINGS Alignment: No substantial sagittal subluxation. Skull base and vertebrae: Acute or subacute T12 superior endplate fracture  with 35% height loss and 4 mm of bony retropulsion. Osteopenia. Disc levels: Multilevel degenerative change without evidence of high-grade bony canal or foraminal stenosis. Paraspinal: Partially imaged large hepatic cystic lesion, better characterized on prior CT abdomen/pelvis. Atherosclerosis. IMPRESSION: CT lumbar spine: Acute or subacute T12 superior endplate fracture with 624THL height loss and 4 mm of bony retropulsion, new since July 31, 2022. CT cervical spine: No evidence of acute fracture or traumatic malalignment. CT head: No evidence of acute intracranial abnormality. Electronically Signed   By: Margaretha Sheffield M.D.   On: 09/05/2022 12:49   CT L-SPINE NO CHARGE  Result Date: 09/05/2022 CLINICAL DATA:  Neck trauma (Age >= 65y); Head trauma, moderate-severe EXAM: CT HEAD WITHOUT CONTRAST CT CERVICAL SPINE WITHOUT CONTRAST CT LUMBAR SPINE WITHOUT CONTRAST TECHNIQUE: Multidetector CT imaging of the head, cervical, lumbar spine was performed following the standard protocol without intravenous contrast. Multiplanar CT image reconstructions of the cervical spine were also generated. RADIATION DOSE REDUCTION: This exam was performed according to the departmental dose-optimization program which includes automated exposure control, adjustment of the mA  and/or kV according to patient size and/or use of iterative reconstruction technique. COMPARISON:  CT abdomen/pelvis February 15, 24. FINDINGS: CT HEAD FINDINGS Brain: No evidence of acute infarction, hemorrhage, hydrocephalus, extra-axial collection or mass lesion/mass effect. Patchy white matter hypodensities, nonspecific but compatible with chronic microvascular ischemic change. Cerebral atrophy. Vascular: Calcific atherosclerosis. Skull: No acute fracture. Sinuses/Orbits: Clear sinuses.  No acute orbital findings. Other: No mastoid effusions. CT CERVICAL SPINE FINDINGS Alignment: Mild anterolisthesis of C7 on T1, likely degenerative given facet  arthropathy. Otherwise, no substantial sagittal subluxation. Skull base and vertebrae: No evidence fracture. Soft tissues and spinal canal: No prevertebral fluid or swelling. No visible canal hematoma. Disc levels: Multilevel facet and uncovertebral hypertrophy without significant Upper chest: Visualized lung apices are clear. CT LUMBAR SPINE FINDINGS Alignment: No substantial sagittal subluxation. Skull base and vertebrae: Acute or subacute T12 superior endplate fracture with 624THL height loss and 4 mm of bony retropulsion. Osteopenia. Disc levels: Multilevel degenerative change without evidence of high-grade bony canal or foraminal stenosis. Paraspinal: Partially imaged large hepatic cystic lesion, better characterized on prior CT abdomen/pelvis. Atherosclerosis. IMPRESSION: CT lumbar spine: Acute or subacute T12 superior endplate fracture with 624THL height loss and 4 mm of bony retropulsion, new since July 31, 2022. CT cervical spine: No evidence of acute fracture or traumatic malalignment. CT head: No evidence of acute intracranial abnormality. Electronically Signed   By: Margaretha Sheffield M.D.   On: 09/05/2022 12:49   CT ABDOMEN PELVIS W CONTRAST  Result Date: 09/05/2022 CLINICAL DATA:  Blunt abdominal trauma. Fall. Pain. Known abdominal aortic aneurysm. Recent hepatic cyst drainage. EXAM: CT ABDOMEN AND PELVIS WITH CONTRAST TECHNIQUE: Multidetector CT imaging of the abdomen and pelvis was performed using the standard protocol following bolus administration of intravenous contrast. RADIATION DOSE REDUCTION: This exam was performed according to the departmental dose-optimization program which includes automated exposure control, adjustment of the mA and/or kV according to patient size and/or use of iterative reconstruction technique. CONTRAST:  4mL OMNIPAQUE IOHEXOL 300 MG/ML  SOLN COMPARISON:  CT abdomen and pelvis 07/31/2022. Ultrasound procedure 08/29/2022 FINDINGS: Lower chest: Heart is enlarged.  Coronary artery calcifications are seen. Small pericardial effusion. Breathing motion at the lung bases. There is some basilar atelectasis. No pleural effusion. Hepatobiliary: The previous cysts along the inferior margin of the right hepatic lobe, partially exophytic on the prior examination, preprocedure headed dimension of 10.1 x 9.1 cm. Today 8.7 x 8.3 cm. There is slight adjacent stranding and thickening which is new and likely postprocedural. Adjacent surgical clips. Smaller cysts identified elsewhere such as segment 2 and segment 8, unchanged. Gallbladder is nondilated. Patent portal vein. Preserved hepatic enhancement otherwise. Pancreas: Unremarkable. No pancreatic ductal dilatation or surrounding inflammatory changes. Spleen: Lobular small spleen. Once again there is a initially low-density structure in the anterior aspect of the spleen measuring 10 mm. This has progressive enhancement on delayed may represent a splenic hemangioma. Adrenals/Urinary Tract: Stable slight nonspecific thickening of the adrenal glands. Once again there are some small low-attenuation lesions along each kidney which are unchanged. Again previously described as Bosniak 1 and 2 lesions. No specific follow-up. Preserved contours of the urinary bladder. Stomach/Bowel: Large bowel has a normal course and caliber with scattered colonic stool. Scattered colonic diverticula. Normal retrocecal appendix. Stomach and small bowel are nondilated. Vascular/Lymphatic: Once again there is a aneurysm noted along the anterior aspect of the abdominal aorta which is somewhat saccular. Diameter of the saccular component has a transverse dimension of 2.4 cm previously and today 2.4  cm on series 4, image 32. AP diameter of the aorta previously at this level measured 3.1 cm and today 3.1 cm. On the prior there is central enhancement within the saccular aneurysm with surrounding hematoma. This is not seen on the current examination. This may have  undergone further thrombosis. Reproductive: Status post hysterectomy. No adnexal masses. Other: No free air or free fluid. Musculoskeletal: Surgical changes along the anterior right hemi abdominal wall. Scattered degenerative changes of the spine. There is new compression injury of the superior endplate of 624THL. Mild. IMPRESSION: Acute slight compression of the superior endplate of 624THL. Please see separate spine CT scan from same day. No bowel obstruction, free air or free fluid. No evidence of solid organ injury. The partially exophytic inferior right hepatic lobe cystic mass is slightly smaller today with increased adjacent stranding and thickening consistent with the patient's recent instrumentation. Central splenic lesion shows progressive enhancement on delayed could be a splenic hemangioma. Known saccular aneurysm from the anterior aspect of the infrarenal abdominal aorta. The size of the aneurysm is stable. The amount of central enhancement appears slightly less today compared to previous. Recommend continued surveillance. Recommend referral to a vascular specialist. This recommendation follows ACR consensus guidelines: White Paper of the ACR Incidental Findings Committee II on Vascular Findings. J Am Coll Radiol 2013; 10:789-794. Enlarged heart with a small pericardial effusion. Electronically Signed   By: Jill Side M.D.   On: 09/05/2022 12:43    Procedures Procedures    Medications Ordered in ED Medications  acetaminophen (TYLENOL) tablet 650 mg (650 mg Oral Not Given 09/05/22 1027)    ED Course/ Medical Decision Making/ A&P                             Medical Decision Making Amount and/or Complexity of Data Reviewed Labs: ordered. Decision-making details documented in ED Course. Radiology: ordered and independent interpretation performed. Decision-making details documented in ED Course. ECG/medicine tests: ordered and independent interpretation performed. Decision-making details  documented in ED Course.  Risk OTC drugs. Prescription drug management.   Mechanical fall with head injury and low back pain.  No loss of consciousness.  Does take Eliquis.  Neurologically intact.  Low suspicion for cord compression or cauda equina.  Patient has seen Dr. Virl Cagey vascular surgery regarding her small aortic aneurysm.  The plan is for further evaluation in 3 months to discuss possible surgery.  Labs show stable hyponatremia at 130. Stable hemoglobin.   CT head and C-spine are negative for acute traumatic pathology.  Results reviewed interpreted by me.  No lumbar spine fracture seen but does have compression fracture of T12 approximately 35% with 4 mm of retropulsion. This likely explains her back pain.  She is neurologically intact.  Discussed with Dr. Marcello Moores of neurosurgery.  Recommends TLSO brace when out of bed and outpatient follow-up.  No indication for surgery.  CT scan shows improvement of her hepatic cyst as well as stability of her aortic aneurysm.  Followup witn neurosurgery and well as specialist for liver cyst and AAA.  Able to ambulate. Pain controlled with tylenol. Return precautions discussed.         Final Clinical Impression(s) / ED Diagnoses Final diagnoses:  Fall, initial encounter  Injury of head, initial encounter  Compression fracture of T12 vertebra, initial encounter ([place])    Rx / DC Orders ED Discharge Orders     None  Ezequiel Essex, MD 09/05/22 [phone]

## 2022-09-05 NOTE — ED Notes (Signed)
Notified ortho tech at [facility] to come and place TLSO

## 2022-09-05 NOTE — ED Notes (Signed)
Spoke w/ortho tech, brace should be delivered shortly.

## 2022-09-05 NOTE — ED Notes (Addendum)
Patient transported to CT 

## 2022-09-05 NOTE — ED Notes (Signed)
Brace applied by ortho tech, patient and family deny questions or concerns at this time.

## 2022-09-05 NOTE — ED Notes (Signed)
Pt up to BR to void

## 2022-09-08 ENCOUNTER — Other Ambulatory Visit: Payer: Self-pay

## 2022-09-08 ENCOUNTER — Emergency Department (HOSPITAL_COMMUNITY)
Admission: EM | Admit: 2022-09-08 | Discharge: 2022-09-08 | Disposition: A | Discharge status: Skilled Nursing Facility | Payer: Medicare Other | Attending: Emergency Medicine | Admitting: Emergency Medicine

## 2022-09-08 ENCOUNTER — Encounter (HOSPITAL_COMMUNITY): Payer: Self-pay

## 2022-09-08 DIAGNOSIS — Z853 Personal history of malignant neoplasm of breast: Secondary | ICD-10-CM | POA: Insufficient documentation

## 2022-09-08 DIAGNOSIS — Z7401 Bed confinement status: Secondary | ICD-10-CM | POA: Diagnosis not present

## 2022-09-08 DIAGNOSIS — R1084 Generalized abdominal pain: Secondary | ICD-10-CM | POA: Diagnosis not present

## 2022-09-08 DIAGNOSIS — Z87891 Personal history of nicotine dependence: Secondary | ICD-10-CM | POA: Insufficient documentation

## 2022-09-08 DIAGNOSIS — I10 Essential (primary) hypertension: Secondary | ICD-10-CM | POA: Insufficient documentation

## 2022-09-08 DIAGNOSIS — R1030 Lower abdominal pain, unspecified: Secondary | ICD-10-CM | POA: Diagnosis not present

## 2022-09-08 DIAGNOSIS — Z7901 Long term (current) use of anticoagulants: Secondary | ICD-10-CM | POA: Insufficient documentation

## 2022-09-08 DIAGNOSIS — R141 Gas pain: Secondary | ICD-10-CM | POA: Insufficient documentation

## 2022-09-08 DIAGNOSIS — M549 Dorsalgia, unspecified: Secondary | ICD-10-CM | POA: Diagnosis not present

## 2022-09-08 DIAGNOSIS — M545 Low back pain, unspecified: Secondary | ICD-10-CM | POA: Diagnosis not present

## 2022-09-08 NOTE — ED Triage Notes (Addendum)
PER EMS: pt is from [facility] facility with c/o right abd pain that started last night at 2230. She also complains of back pain. She fell two days ago, was seen at [facility], and sustained a T-12 compression fx.   BP-160/108, HR-88, 99% RA

## 2022-09-08 NOTE — ED Provider Notes (Signed)
[facility] DEPT Provider Note: Georgena Spurling, MD, FACEP  CSN: [id number] MRN: [id number] ARRIVAL: 09/08/22 at [place]: [place]  Abdominal Pain   HISTORY OF PRESENT ILLNESS  09/08/22 3:32 AM Whitney Burton is a 89 y.o. female who fell at her living facility and was seen in the ED on 09/05/2022.  CT of the abdomen and pelvis showed an acute T12 superior endplate fracture with 624THL height loss and 4 mm of bony retropulsion.  There was no evidence of other acute intra-abdominal injury.  Dr. Marcello Moores of neurosurgery recommended a TLSO brace when out of bed but no indication for surgery.  She returns with right abdominal pain that started yesterday evening about 10-30 p.m. she states the lower part of her abdomen felt distended and the pain was crampy and gas pain like.  She has subsequently been passing copious amounts of gas and the pain is now resolved.  Her lower abdomen is no longer distended and is now soft and nontender.  She does not desire any additional workup at this time.  She is continuing to have back pain at the level of her known fracture.  She is not currently wearing her TLSO but has it at her living facility.   Past Medical History:  Diagnosis Date   A-fib [facility])    Anxiety    Arrhythmia    Breast cancer ([place])    Left Breast   Hypertension     Past Surgical History:  Procedure Laterality Date   HEMORRHOID SURGERY     LIVER SURGERY     found lumps in late 27s   PARTIAL HYSTERECTOMY      Family History  Problem Relation Age of Onset   Heart attack Father    Pancreatic cancer Brother    Colon cancer Neg Hx    Rectal cancer Neg Hx    Stomach cancer Neg Hx    Esophageal cancer Neg Hx    Liver cancer Neg Hx     Social History   Tobacco Use   Smoking status: Former   Smokeless tobacco: Never   Tobacco comments:    Smoked in teens  Vaping Use   Vaping Use: Never used  Substance Use Topics   Alcohol use: Yes    Alcohol/week: 1.0  standard drink of alcohol    Types: 1 Glasses of wine per week    Comment: Occassional   Drug use: Never    Prior to Admission medications   Medication Sig Start Date End Date Taking? Authorizing Provider  acetaminophen (TYLENOL) 500 MG tablet 1 tablet as needed    [provider]  Cholecalciferol (VITAMIN D3) 25 MCG (1000 UT) CAPS Take by mouth. Take 1000 IU daily    [provider]  ELIQUIS 5 MG TABS tablet TAKE 1 TABLET BY MOUTH TWICE A DAY 03/24/22   O'Neal, Cassie Freer, MD  Nebivolol HCl 20 MG TABS Take 1 tablet (20 mg total) by mouth daily. 03/18/22   O'NealCassie Freer, MD  spironolactone (ALDACTONE) 25 MG tablet Take 1 tablet (25 mg total) by mouth daily. Patient taking differently: Take 12.5 mg by mouth daily. Take 1/2 Tablet Daily 02/24/22   Geralynn Rile, MD  TIADYLT ER 240 MG 24 hr capsule TAKE 1 CAPSULE BY MOUTH EVERY DAY 04/07/22   O'Neal, Cassie Freer, MD  torsemide (DEMADEX) 20 MG tablet Take 1 tablet (20 mg total) by mouth as needed (Edema). 12/11/21   O'Neal, Cassie Freer, MD  Allergies Ciprofloxacin hcl, Levofloxacin, Ondansetron hcl, and Furosemide   REVIEW OF SYSTEMS  Negative except as noted here or in the History of Present Illness.   PHYSICAL EXAMINATION  Initial Vital Signs Blood pressure (!) 142/89, pulse 80, temperature 97.7 F (36.5 C), temperature source Oral, resp. rate (!) 23, height 4' 11.5" (1.511 m), weight 68 kg, SpO2 94 %.  Examination General: Well-developed, well-nourished female in no acute distress; appearance consistent with age of record HENT: normocephalic; atraumatic Eyes: Normal appearance Neck: supple Heart: regular rate and rhythm Lungs: clear to auscultation bilaterally Abdomen: soft; nondistended; nontender; no masses or hepatosplenomegaly; bowel sounds present Back: Lower T-spine tenderness Extremities: No deformity; full range of motion; pulses normal Neurologic: Awake, alert and oriented; motor  function intact in all extremities and symmetric; no facial droop Skin: Warm and dry Psychiatric: Normal mood and affect   RESULTS  Summary of this visit's results, reviewed and interpreted by myself:   EKG Interpretation  Date/Time:  Monday September 08 2022 01:24:39 EDT Ventricular Rate:  89 PR Interval:    QRS Duration: 97 QT Interval:  389 QTC Calculation: 435 R Axis:   188 Text Interpretation: Atrial fibrillation Ventricular premature complex Anterolateral infarct, age indeterminate No significant change was found Confirmed by Shanon Rosser [phone]) on 09/08/2022 1:44:18 AM       Laboratory Studies: No results found for this or any previous visit (from the past 24 hour(s)). Imaging Studies: No results found.  ED COURSE and MDM  Nursing notes, initial and subsequent vitals signs, including pulse oximetry, reviewed and interpreted by myself.  Vitals:   09/08/22 0130 09/08/22 0200 09/08/22 0230 09/08/22 0300  BP: (!) 150/87 (!) 142/84 135/85 (!) 142/89  Pulse: 73 79 81 80  Resp: (!) 25 (!) 27 (!) 22 (!) 23  Temp:      TempSrc:      SpO2: 94% 93% 93% 94%  Weight:      Height:       Medications - No data to display  The patient's pain sounds consistent with gas in the colon.  It is unclear if this is related to her recent injury.  No intra-abdominal injury was seen.  She has not been on any narcotic pain medication.  The patient does not require any further workup at this time and concur with this decision.  PROCEDURES  Procedures   ED DIAGNOSES     ICD-10-CM   1. Abdominal gas pain  R14.1          Tykia Mellone, Jenny Reichmann, MD 09/08/22 [phone]

## 2022-09-10 DIAGNOSIS — S22080A Wedge compression fracture of T11-T12 vertebra, initial encounter for closed fracture: Secondary | ICD-10-CM | POA: Diagnosis not present

## 2022-10-02 ENCOUNTER — Other Ambulatory Visit: Payer: Self-pay | Admitting: Cardiovascular Disease

## 2022-10-02 DIAGNOSIS — I4821 Permanent atrial fibrillation: Secondary | ICD-10-CM

## 2022-10-02 NOTE — Telephone Encounter (Signed)
Prescription refill request for Eliquis received. Indication:afib  Last office visit: 08/12/2022, O'neal  Scr: 0.78, 09/05/2022 Age: 89 yo  Weight: 68 kg   Refill sent.

## 2022-10-21 ENCOUNTER — Other Ambulatory Visit: Payer: Self-pay

## 2022-10-21 DIAGNOSIS — I7143 Infrarenal abdominal aortic aneurysm, without rupture: Secondary | ICD-10-CM

## 2022-10-22 ENCOUNTER — Encounter (HOSPITAL_BASED_OUTPATIENT_CLINIC_OR_DEPARTMENT_OTHER): Payer: Self-pay | Admitting: Family Medicine

## 2022-10-22 ENCOUNTER — Ambulatory Visit (INDEPENDENT_AMBULATORY_CARE_PROVIDER_SITE_OTHER): Payer: Medicare Other | Admitting: Family Medicine

## 2022-10-22 VITALS — BP 118/70 | HR 63 | Ht 59.0 in | Wt 146.0 lb

## 2022-10-22 DIAGNOSIS — Z7689 Persons encountering health services in other specified circumstances: Secondary | ICD-10-CM | POA: Diagnosis not present

## 2022-10-22 DIAGNOSIS — I4891 Unspecified atrial fibrillation: Secondary | ICD-10-CM | POA: Diagnosis not present

## 2022-10-22 DIAGNOSIS — I714 Abdominal aortic aneurysm, without rupture, unspecified: Secondary | ICD-10-CM

## 2022-10-22 DIAGNOSIS — F419 Anxiety disorder, unspecified: Secondary | ICD-10-CM

## 2022-10-22 DIAGNOSIS — F32A Depression, unspecified: Secondary | ICD-10-CM | POA: Diagnosis not present

## 2022-10-22 DIAGNOSIS — I10 Essential (primary) hypertension: Secondary | ICD-10-CM | POA: Diagnosis not present

## 2022-10-22 MED ORDER — CITALOPRAM HYDROBROMIDE 10 MG PO TABS: ORAL_TABLET | ORAL | 1 refills | Status: DC

## 2022-10-22 NOTE — Progress Notes (Unsigned)
New Patient Office Visit  Subjective    Patient ID: Whitney Burton, female    DOB: 1932-07-23  Age: 89 y.o. MRN: [id number]  HPI Whitney Burton is an 89 yo female who presents to establish care. Will have patient sign release of records.   History: afib, anxiety/depression, L breast cancer, HTN  Anxiety/depression- started taking Celexa 10mg  on 4/29  HTN- home BP readings  4/22: 114/88; 59 Tues: 111/82; 87 Thurs: 127/61; 58 Sun 5/5: 122/81; 67 Tues 5/7: 109/74; 77 140/42; 58 102/67; 55 126/85; 66 101/53; 58 Wed 114/59; 59 130/64; 65 115/77; 53 Daily meds: Nebivolol- 20mg  daily (taken in PM), Spiro 12.5mg  daily (AM), Tiadylt (dilt) 240mg  daily (AM) & Torsemide 20mg  PRN Hx of afib- on Eliquis. Reports shob with exertion, walking and movement. Hx of AAA Followed by cardiology Q4-6 months, she prefers 3 months  6/27 appt scheduled Dr. Wynelle Link  Last saw on 08/12/2022   07/2022 CT A/P 3.1 cm inferior AAA, small nodules, largest 5 mm right middle lobe, 2 to 3 mm right lower lobe, 3 to 4 mm left lower lobe  Repeat CT scan 5/30  Larey Seat about 6 weeks ago  TLSO brace for compression fracture  Seeing Dr. Maisie Fus (neurosurgeon) on Monday 5/13  No history of DM2, fasting glucose 88 in Jan 2024 per patient report   Outpatient Encounter Medications as of 10/22/2022  Medication Sig   acetaminophen (TYLENOL) 500 MG tablet 1 tablet as needed   Cholecalciferol (VITAMIN D3) 25 MCG (1000 UT) CAPS Take by mouth. Take 1000 IU daily   citalopram (CELEXA) 10 MG tablet 1 tablet Orally Once a day for 30 days   ELIQUIS 5 MG TABS tablet TAKE 1 TABLET BY MOUTH TWICE A DAY   Nebivolol HCl 20 MG TABS Take 1 tablet (20 mg total) by mouth daily.   spironolactone (ALDACTONE) 25 MG tablet Take 1 tablet (25 mg total) by mouth daily. (Patient taking differently: Take 12.5 mg by mouth daily. Take 1/2 Tablet Daily)   TIADYLT ER 240 MG 24 hr capsule TAKE 1 CAPSULE BY MOUTH EVERY DAY   torsemide (DEMADEX) 20 MG tablet Take 1  tablet (20 mg total) by mouth as needed (Edema).   traMADol (ULTRAM) 50 MG tablet Take 50 mg by mouth every 6 (six) hours as needed.   No facility-administered encounter medications on file as of 10/22/2022.    Past Medical History:  Diagnosis Date   A-fib [facility])    Anxiety    Arrhythmia    Breast cancer (HCC)    Left Breast   Hypertension     Past Surgical History:  Procedure Laterality Date   HEMORRHOID SURGERY     LIVER SURGERY     found lumps in late 61s   PARTIAL HYSTERECTOMY      Family History  Problem Relation Age of Onset   Heart attack Father    Pancreatic cancer Brother    Colon cancer Neg Hx    Rectal cancer Neg Hx    Stomach cancer Neg Hx    Esophageal cancer Neg Hx    Liver cancer Neg Hx     Social History   Socioeconomic History   Marital status: Widowed    Spouse name: Not on file   Number of children: 3   Years of education: Not on file   Highest education level: Not on file  Occupational History   Occupation: retired  Tobacco Use   Smoking status: Former   Smokeless tobacco: Never  Tobacco comments:    Smoked in teens  Vaping Use   Vaping Use: Never used  Substance and Sexual Activity   Alcohol use: Yes    Alcohol/week: 1.0 standard drink of alcohol    Types: 1 Glasses of wine per week    Comment: Occassional   Drug use: Never   Sexual activity: Not on file  Other Topics Concern   Not on file  Social History Narrative   Widowed. Moved to [place] to be closer to daughter.    Social Determinants of Health   Financial Resource Strain: Not on file  Food Insecurity: Not on file  Transportation Needs: Not on file  Physical Activity: Not on file  Stress: Not on file  Social Connections: Not on file  Intimate Partner Violence: Not on file    Review of Systems  Constitutional:  Negative for malaise/fatigue.  HENT:  Negative for ear pain and tinnitus.   Eyes:  Negative for blurred vision and double vision.  Respiratory:  Positive  for shortness of breath (with exertion). Negative for cough.   Cardiovascular:  Negative for chest pain, palpitations, orthopnea and claudication.  Gastrointestinal:  Negative for abdominal pain, nausea and vomiting.  Genitourinary:  Negative for frequency and urgency.  Musculoskeletal:  Negative for myalgias.  Neurological:  Negative for dizziness, weakness and headaches.  Psychiatric/Behavioral:  Negative for depression, substance abuse and suicidal ideas. The patient is not nervous/anxious and does not have insomnia.      Objective    BP 118/70   Pulse 63   Ht 4\' 11"  (1.499 m)   Wt 146 lb (66.2 kg)   SpO2 97%   BMI 29.49 kg/m   Physical Exam Constitutional:      Appearance: Normal appearance.  Cardiovascular:     Rate and Rhythm: Normal rate and regular rhythm.     Pulses: Normal pulses.     Heart sounds: Normal heart sounds.  Pulmonary:     Effort: Pulmonary effort is normal.     Breath sounds: Normal breath sounds.  Neurological:     Mental Status: She is alert.  Psychiatric:        Mood and Affect: Mood normal.        Behavior: Behavior normal.        Thought Content: Thought content normal.        Judgment: Judgment normal.    Assessment & Plan:   1. Encounter to establish care Patient is here to establish care with an office that is closer to her home. Patient presents to the visit with her daughter. Patient is in no acute distress and answers all questions appropriately.   2. Anxiety and depression Patient reports recent onset of anxiety and depression. Reports she started taking Celexa 10mg  daily on 10/13/2022. She occasionally feels increases in anxiety associated with feeling of warmth sensation throughout her body. She reports that she does not notice a difference in her mood. Educated about mechanism of action, common side effects, daily adherence to medication, and that improvement in mood may take 4-6 weeks. Plan to follow-up in 4 weeks for mood and will  discuss possible increase titration of medication.     3. Atrial fibrillation, unspecified type [facility]) Patient currently taking Eliquis 5mg  twice daily, diltiazen 240mg  daily, and bisoprolol 20mg  daily. Followed by cardiology. Does not need refill at this time.   4. Primary hypertension Patient followed by cardiology. Currently taking nebivolol 20mg  daily, spironolactone 12.5mg  daily, Tiadylt 240mg  daily & Torsemide 20mg   PRN for edema. Does not need refills at this time.   5. Abdominal aortic aneurysm (AAA) without rupture, unspecified part (HCC) Review of notes, patient diagnosed with "saccular aneurysm from the anterior aspect of the infrarenal abdominal aorta" that is stable in size. Patient is followed by cardiology (yearly surveillance) and has well controlled blood pressure in office. Reviewed home readings. Plan to review previous records. If patient not being followed by vascular specialist, will place referral.   Return in about 4 weeks (around 11/19/2022) for Mood f/u.   Alyson Reedy, FNP

## 2022-10-27 DIAGNOSIS — S22080A Wedge compression fracture of T11-T12 vertebra, initial encounter for closed fracture: Secondary | ICD-10-CM | POA: Diagnosis not present

## 2022-11-03 DIAGNOSIS — L814 Other melanin hyperpigmentation: Secondary | ICD-10-CM | POA: Diagnosis not present

## 2022-11-03 DIAGNOSIS — L821 Other seborrheic keratosis: Secondary | ICD-10-CM | POA: Diagnosis not present

## 2022-11-03 DIAGNOSIS — L570 Actinic keratosis: Secondary | ICD-10-CM | POA: Diagnosis not present

## 2022-11-03 DIAGNOSIS — D1801 Hemangioma of skin and subcutaneous tissue: Secondary | ICD-10-CM | POA: Diagnosis not present

## 2022-11-03 DIAGNOSIS — L853 Xerosis cutis: Secondary | ICD-10-CM | POA: Diagnosis not present

## 2022-11-07 ENCOUNTER — Ambulatory Visit: Payer: Medicare Other | Admitting: Vascular Surgery

## 2022-11-12 NOTE — Progress Notes (Signed)
Office Note    HPI: Whitney Burton is a 89 y.o. (09/18/1932) female presenting in follow-up with known, small, saccular aortic aneurysm.  Whitney Burton was last seen in my clinic 3 months ago after initially being seen in the hospital with right upper quadrant pain.  CT imaging demonstrated large liver cyst with small, eccentric infrarenal abdominal aneurysm.  At her last visit, we discussed endovascular aortic repair with the use of a stent graft versus medical management.  At the age of 66, Whitney Burton elected to continue medical management with repeat imaging in 3 months.  Exam today, Whitney Burton was doing well.  Shortly after her last visit, she fell fracturing T12.  She has been working hard to recover.  Currently using a walker to ambulate.  She has also had the liver cyst drained, which alleviated to the right upper quadrant pain. Denies new onset back, abdominal, pelvic pain  Surgical history includes hysterectomy, liver cyst excision 40 years ago.  The pt is not on a statin for cholesterol management.  The pt is not on a daily aspirin.   Other AC:  Eliquis The pt is on medication for hypertension.   The pt is not diabetic.  Tobacco hx:  -  Past Medical History:  Diagnosis Date   A-fib (HCC)    Anxiety    Arrhythmia    Breast cancer (HCC)    Left Breast   Hypertension     Past Surgical History:  Procedure Laterality Date   HEMORRHOID SURGERY     LIVER SURGERY     found lumps in late 69s   PARTIAL HYSTERECTOMY      Social History   Socioeconomic History   Marital status: Widowed    Spouse name: Not on file   Number of children: 3   Years of education: Not on file   Highest education level: Not on file  Occupational History   Occupation: retired  Tobacco Use   Smoking status: Former   Smokeless tobacco: Never   Tobacco comments:    Smoked in teens  Advertising account planner   Vaping Use: Never used  Substance and Sexual Activity   Alcohol use: Yes    Alcohol/week: 1.0 standard drink of alcohol     Types: 1 Glasses of wine per week    Comment: Occassional   Drug use: Never   Sexual activity: Not on file  Other Topics Concern   Not on file  Social History Narrative   Widowed. Moved to [place] to be closer to daughter.    Social Determinants of Health   Financial Resource Strain: Not on file  Food Insecurity: Not on file  Transportation Needs: Not on file  Physical Activity: Not on file  Stress: Not on file  Social Connections: Not on file  Intimate Partner Violence: Not on file   Family History  Problem Relation Age of Onset   Heart attack Father    Pancreatic cancer Brother    Colon cancer Neg Hx    Rectal cancer Neg Hx    Stomach cancer Neg Hx    Esophageal cancer Neg Hx    Liver cancer Neg Hx     Current Outpatient Medications  Medication Sig Dispense Refill   acetaminophen (TYLENOL) 500 MG tablet 1 tablet as needed     Cholecalciferol (VITAMIN D3) 25 MCG (1000 UT) CAPS Take by mouth. Take 1000 IU daily     citalopram (CELEXA) 10 MG tablet 1 tablet Orally Once a day for 30 days 90  tablet 1   ELIQUIS 5 MG TABS tablet TAKE 1 TABLET BY MOUTH TWICE A DAY 180 tablet 1   Nebivolol HCl 20 MG TABS Take 1 tablet (20 mg total) by mouth daily. 90 tablet 3   spironolactone (ALDACTONE) 25 MG tablet Take 1 tablet (25 mg total) by mouth daily. (Patient taking differently: Take 12.5 mg by mouth daily. Take 1/2 Tablet Daily) 90 tablet 3   TIADYLT ER 240 MG 24 hr capsule TAKE 1 CAPSULE BY MOUTH EVERY DAY 90 capsule 3   torsemide (DEMADEX) 20 MG tablet Take 1 tablet (20 mg total) by mouth as needed (Edema). 90 tablet 3   traMADol (ULTRAM) 50 MG tablet Take 50 mg by mouth every 6 (six) hours as needed.     No current facility-administered medications for this visit.    Allergies  Allergen Reactions   Ciprofloxacin Hcl     Other reaction(s): Unknown   Levofloxacin     Other reaction(s): Unknown   Ondansetron Hcl     Other reaction(s): constipation   Furosemide Itching     Itching after taking Lasix?     REVIEW OF SYSTEMS:  [X]  denotes positive finding, [ ]  denotes negative finding Cardiac  Comments:  Chest pain or chest pressure:    Shortness of breath upon exertion:    Short of breath when lying flat:    Irregular heart rhythm:        Vascular    Pain in calf, thigh, or hip brought on by ambulation:    Pain in feet at night that wakes you up from your sleep:     Blood clot in your veins:    Leg swelling:         Pulmonary    Oxygen at home:    Productive cough:     Wheezing:         Neurologic    Sudden weakness in arms or legs:     Sudden numbness in arms or legs:     Sudden onset of difficulty speaking or slurred speech:    Temporary loss of vision in one eye:     Problems with dizziness:         Gastrointestinal    Blood in stool:     Vomited blood:         Genitourinary    Burning when urinating:     Blood in urine:        Psychiatric    Major depression:         Hematologic    Bleeding problems:    Problems with blood clotting too easily:        Skin    Rashes or ulcers:        Constitutional    Fever or chills:      PHYSICAL EXAMINATION:  There were no vitals filed for this visit.  General:  WDWN in NAD; vital signs documented above Gait: Not observed HENT: WNL, normocephalic Pulmonary: normal non-labored breathing , without wheezing Cardiac: regular HR Abdomen: soft, NT, no masses Skin: without rashes Vascular Exam/Pulses:  Right Left  Radial 2+ (normal) 2+ (normal)  Ulnar    Femoral 2+ (normal) 2+ (normal)  Popliteal    DP 2+ (normal) 2+ (normal)  PT     Extremities: without ischemic changes, without Gangrene , without cellulitis; without open wounds;  Musculoskeletal: no muscle wasting or atrophy  Neurologic: A&O X 3;  No focal weakness or paresthesias are detected Psychiatric:  The pt has Normal affect.   Non-Invasive Vascular Imaging:    Moderate mixed calcific atherosclerosis. Unchanged  saccular aneurysm of the anterior infrarenal abdominal aorta with a large burden of mural thrombus. Overall dimensions of the vessel at this level 3.1 x 2.4 cm, saccular aneurysm component 2.2 x 1.7 cm (series 5, image 64).    ASSESSMENT/PLAN: Whitney Burton is a 89 y.o. female presenting with aortic aneurysm which appears to be from a degenerated PAU.  At her last visit, she elected to continue medical management with repeat imaging.  Repeat imaging demonstrates no changes in the size of the saccular infrarenal abdominal aneurysm.  Similar to our last visit, I offered repair, this could be performed an endovascular approach with aortic cuff placement.  Whitney Burton was not interested in pursuing surgery.  She understands that this has a risk of rupture, which would likely lead to mortality.  I asked that she discuss her DNR, DNI status with her family if she does not want the lesion fixed.  We discussed both continuing to follow the lesion and foregoing further imaging.  At this time, she would like to forego further imaging.  I asked her my office should she change her mind as I am happy to follow the lesion or offer repair. No plan for elective repair.  Unclear if she would want emergent repair.   Whitney Sparrow, MD [facility] [phone]

## 2022-11-13 ENCOUNTER — Ambulatory Visit (HOSPITAL_BASED_OUTPATIENT_CLINIC_OR_DEPARTMENT_OTHER)
Admission: RE | Admit: 2022-11-13 | Discharge: 2022-11-13 | Disposition: A | Discharge status: Home / Self Care | Payer: Medicare Other | Source: Ambulatory Visit | Attending: Pulmonary Disease | Admitting: Pulmonary Disease

## 2022-11-13 ENCOUNTER — Encounter (HOSPITAL_BASED_OUTPATIENT_CLINIC_OR_DEPARTMENT_OTHER): Payer: Self-pay

## 2022-11-13 DIAGNOSIS — I7143 Infrarenal abdominal aortic aneurysm, without rupture: Secondary | ICD-10-CM | POA: Diagnosis not present

## 2022-11-13 DIAGNOSIS — R918 Other nonspecific abnormal finding of lung field: Secondary | ICD-10-CM | POA: Insufficient documentation

## 2022-11-13 DIAGNOSIS — I714 Abdominal aortic aneurysm, without rupture, unspecified: Secondary | ICD-10-CM | POA: Diagnosis not present

## 2022-11-13 LAB — POCT I-STAT CREATININE: Creatinine, Ser: 0.7 mg/dL (ref 0.44–1.00)

## 2022-11-13 MED ORDER — IOHEXOL 350 MG/ML SOLN
100.0000 mL | Freq: Once | INTRAVENOUS | Status: AC | PRN
  Administered 2022-11-13: 85 mL via INTRAVENOUS

## 2022-11-14 ENCOUNTER — Ambulatory Visit: Payer: Medicare Other | Admitting: Vascular Surgery

## 2022-11-14 ENCOUNTER — Encounter: Payer: Self-pay | Admitting: Vascular Surgery

## 2022-11-14 VITALS — BP 117/79 | HR 64 | Temp 98.2°F | Resp 20 | Ht 59.0 in | Wt 144.0 lb

## 2022-11-14 DIAGNOSIS — I7143 Infrarenal abdominal aortic aneurysm, without rupture: Secondary | ICD-10-CM

## 2022-11-18 ENCOUNTER — Ambulatory Visit (HOSPITAL_BASED_OUTPATIENT_CLINIC_OR_DEPARTMENT_OTHER): Payer: Medicare Other | Admitting: Family Medicine

## 2022-12-02 DIAGNOSIS — M810 Age-related osteoporosis without current pathological fracture: Secondary | ICD-10-CM | POA: Diagnosis not present

## 2022-12-02 DIAGNOSIS — I10 Essential (primary) hypertension: Secondary | ICD-10-CM | POA: Diagnosis not present

## 2022-12-02 DIAGNOSIS — I5032 Chronic diastolic (congestive) heart failure: Secondary | ICD-10-CM | POA: Diagnosis not present

## 2022-12-02 DIAGNOSIS — I4821 Permanent atrial fibrillation: Secondary | ICD-10-CM | POA: Diagnosis not present

## 2022-12-02 DIAGNOSIS — D6869 Other thrombophilia: Secondary | ICD-10-CM | POA: Diagnosis not present

## 2022-12-02 DIAGNOSIS — I700 Atherosclerosis of aorta: Secondary | ICD-10-CM | POA: Diagnosis not present

## 2022-12-02 DIAGNOSIS — I7143 Infrarenal abdominal aortic aneurysm, without rupture: Secondary | ICD-10-CM | POA: Diagnosis not present

## 2022-12-02 DIAGNOSIS — I110 Hypertensive heart disease with heart failure: Secondary | ICD-10-CM | POA: Diagnosis not present

## 2022-12-08 DIAGNOSIS — S22080A Wedge compression fracture of T11-T12 vertebra, initial encounter for closed fracture: Secondary | ICD-10-CM | POA: Diagnosis not present

## 2022-12-08 NOTE — Progress Notes (Signed)
Cardiology Office Note:   Date:  12/11/2022  NAME:  Whitney Burton    MRN: [id number] DOB:  [birth year]   PCP:  Deatra James, MD  Cardiologist:  Reatha Harps, MD  Electrophysiologist:  None   Referring MD: Deatra James, MD   Chief Complaint  Patient presents with   Follow-up    History of Present Illness:   Whitney Burton is a 89 y.o. female with a hx of AAA, Afib, HFpEF, HTN who presents for follow-up.  She reports she has low energy.  Blood pressure is a bit low.  Heart rate is in the 40 to 50 bpm range.  She has remained on nebivolol and diltiazem.  We discussed stopping her nebivolol.  Weight continues to decline.  She does not like the food in her independent living.  We talked at she likely does not need as much medications that she is on given that her weight is going down.  She has no appreciable edema on examination.  She gets concerned when her feet and ankles get a little puffy.  I encouraged her that this is not edema.  I recommended leg elevation.  She does have a abdominal aortic aneurysm with a penetrating ulcer.  She is not going to pursue treatment of this.  Reports no chest pain.  Just low energy and shortness of breath with activity.  Problem List 1. Permanent Atrial Fibrillation  -diagnosed 2019 [place] -failed DCCV and did not want to do rhythm medications  -CHADSVASC= 4 (age, sex, HTN) 2. Hypertension 3. HFpEF  -BNP 451 -EF 60-65% 4. AAA -31 mm 07/31/2022 -penetrating aortic ulcer?  Past Medical History: Past Medical History:  Diagnosis Date   A-fib (HCC)    Anxiety    Arrhythmia    Breast cancer (HCC)    Left Breast   Hypertension     Past Surgical History: Past Surgical History:  Procedure Laterality Date   HEMORRHOID SURGERY     LIVER SURGERY     found lumps in late 56s   PARTIAL HYSTERECTOMY      Current Medications: Current Meds  Medication Sig   acetaminophen (TYLENOL) 500 MG tablet 1 tablet as needed   ALPRAZolam (XANAX) 0.25 MG tablet  Take 0.25 mg by mouth as needed for anxiety.   Cholecalciferol (VITAMIN D3) 25 MCG (1000 UT) CAPS Take by mouth. Take 1000 IU daily   citalopram (CELEXA) 10 MG tablet 1 tablet Orally Once a day for 30 days   ELIQUIS 5 MG TABS tablet TAKE 1 TABLET BY MOUTH TWICE A DAY   spironolactone (ALDACTONE) 25 MG tablet Take 1 tablet (25 mg total) by mouth daily. (Patient taking differently: Take 12.5 mg by mouth daily. Take 1/2 Tablet Daily)   TIADYLT ER 240 MG 24 hr capsule TAKE 1 CAPSULE BY MOUTH EVERY DAY   torsemide (DEMADEX) 20 MG tablet Take 1 tablet (20 mg total) by mouth as needed (Edema).   [DISCONTINUED] Nebivolol HCl 20 MG TABS Take 1 tablet (20 mg total) by mouth daily.     Allergies:    Ciprofloxacin hcl, Levofloxacin, Ondansetron hcl, and Furosemide   Social History: Social History   Socioeconomic History   Marital status: Widowed    Spouse name: Not on file   Number of children: 3   Years of education: Not on file   Highest education level: Not on file  Occupational History   Occupation: retired  Tobacco Use   Smoking status: Former   Smokeless  tobacco: Never   Tobacco comments:    Smoked in teens  Vaping Use   Vaping Use: Never used  Substance and Sexual Activity   Alcohol use: Yes    Alcohol/week: 1.0 standard drink of alcohol    Types: 1 Glasses of wine per week    Comment: Occassional   Drug use: Never   Sexual activity: Not on file  Other Topics Concern   Not on file  Social History Narrative   Widowed. Moved to [place] to be closer to daughter.    Social Determinants of Health   Financial Resource Strain: Not on file  Food Insecurity: Not on file  Transportation Needs: Not on file  Physical Activity: Not on file  Stress: Not on file  Social Connections: Not on file     Family History: The patient's family history includes Heart attack in her father; Pancreatic cancer in her brother. There is no history of Colon cancer, Rectal cancer, Stomach cancer,  Esophageal cancer, or Liver cancer.  ROS:   All other ROS reviewed and negative. Pertinent positives noted in the HPI.     EKGs/Labs/Other Studies Reviewed:   The following studies were personally reviewed by me today:  EKG:  EKG is not ordered today.        Recent Labs: 01/19/2022: TSH 1.023 09/05/2022: ALT 14; BUN 14; Hemoglobin 16.2; Platelets 308; Potassium 4.7; Sodium 130 11/13/2022: Creatinine, Ser 0.70   Recent Lipid Panel No results found for: "CHOL", "TRIG", "HDL", "CHOLHDL", "VLDL", "LDLCALC", "LDLDIRECT"  Physical Exam:   VS:  BP 132/78 (BP Location: Right Arm, Patient Position: Sitting, Cuff Size: Normal)   Pulse (!) 52   Ht 4\' 11"  (1.499 m)   Wt 142 lb 3.2 oz (64.5 kg)   SpO2 93%   BMI 28.72 kg/m    Wt Readings from Last 3 Encounters:  12/11/22 142 lb 3.2 oz (64.5 kg)  11/14/22 144 lb (65.3 kg)  10/22/22 146 lb (66.2 kg)    General: Well nourished, well developed, in no acute distress Head: Atraumatic, normal size  Eyes: PEERLA, EOMI  Neck: Supple, no JVD Endocrine: No thryomegaly Cardiac: Normal S1, S2; irregular rhythm, no murmurs rubs or gallops Lungs: Clear to auscultation bilaterally, no wheezing, rhonchi or rales  Abd: Soft, nontender, no hepatomegaly  Ext: No edema, pulses 2+ Musculoskeletal: No deformities, BUE and BLE strength normal and equal Skin: Warm and dry, no rashes   Neuro: Alert and oriented to person, place, time, and situation, CNII-XII grossly intact, no focal deficits  Psych: Normal mood and affect   ASSESSMENT:   Whitney Burton is a 89 y.o. female who presents for the following: 1. Longstanding persistent atrial fibrillation (HCC)   2. Chronic diastolic heart failure (HCC)   3. Infrarenal abdominal aortic aneurysm (AAA) without rupture (HCC)   4. Compression fracture of body of thoracic vertebra (HCC)     PLAN:   1. Longstanding persistent atrial fibrillation (HCC) -Longstanding history of atrial fibrillation.  We have pursued rate  control strategy.  Heart rate a bit too low.  Stop nebivolol.  Blood pressure is also low.  She will continue diltiazem 240 mg daily.  She is on Eliquis.  No bruising or bleeding.  2. Chronic diastolic heart failure (HCC) -No appreciable edema.  On Aldactone.  I have asked her to back off on torsemide.  Her symptoms of shortness of breath are likely related to dehydration and low blood pressure.  We will see how she does with stopping  nebivolol.  3. Infrarenal abdominal aortic aneurysm (AAA) without rupture (HCC) -Concerns for penetrating aortic ulcer.  Seen by vascular surgery.  Not wanting any procedures.  I think this is reasonable.  Cholesterol level is acceptable.  4. Compression fracture of body of thoracic vertebra (HCC) -Recent compression fracture of the thoracic vertebra.  Referral to PT.  She has been inactive.  I think this is contributing to her shortness of breath.      Disposition: Return in about 3 months (around 03/13/2023).  Medication Adjustments/Labs and Tests Ordered: Current medicines are reviewed at length with the patient today.  Concerns regarding medicines are outlined above.  Orders Placed This Encounter  Procedures   Ambulatory referral to Physical Therapy   No orders of the defined types were placed in this encounter.  Patient Instructions  Medication Instructions:  STOP NEBIVOLOL *If you need a refill on your cardiac medications before your next appointment, please call your pharmacy*  Follow-Up: At [facility], you and your health needs are our priority.  As part of our continuing mission to provide you with exceptional heart care, we have created designated Provider Care Teams.  These Care Teams include your primary Cardiologist (physician) and Advanced Practice Providers (APPs -  Physician Assistants and Nurse Practitioners) who all work together to provide you with the care you need, when you need it.  We recommend signing up for the patient  portal called "MyChart".  Sign up information is provided on this After Visit Summary.  MyChart is used to connect with patients for Virtual Visits (Telemedicine).  Patients are able to view lab/test results, encounter notes, upcoming appointments, etc.  Non-urgent messages can be sent to your provider as well.   To learn more about what you can do with MyChart, go to [email or web address].    Your next appointment:   3 month(s)  Provider:   Reatha Harps, MD        Time Spent with Patient: I have spent a total of 25 minutes with patient reviewing hospital notes, telemetry, EKGs, labs and examining the patient as well as establishing an assessment and plan that was discussed with the patient.  > 50% of time was spent in direct patient care.  Signed, Lenna Gilford. Flora Lipps, MD, [facility]  [facility]  [address] [place], [place] [address] [phone]  12/11/2022 11:13 AM

## 2022-12-11 ENCOUNTER — Ambulatory Visit: Payer: Medicare Other | Attending: Cardiovascular Disease | Admitting: Cardiovascular Disease

## 2022-12-11 ENCOUNTER — Encounter: Payer: Self-pay | Admitting: Cardiovascular Disease

## 2022-12-11 VITALS — BP 132/78 | HR 52 | Ht 59.0 in | Wt 142.2 lb

## 2022-12-11 DIAGNOSIS — I4811 Longstanding persistent atrial fibrillation: Secondary | ICD-10-CM

## 2022-12-11 DIAGNOSIS — S22000A Wedge compression fracture of unspecified thoracic vertebra, initial encounter for closed fracture: Secondary | ICD-10-CM

## 2022-12-11 DIAGNOSIS — I7143 Infrarenal abdominal aortic aneurysm, without rupture: Secondary | ICD-10-CM | POA: Diagnosis not present

## 2022-12-11 DIAGNOSIS — I5032 Chronic diastolic (congestive) heart failure: Secondary | ICD-10-CM | POA: Diagnosis not present

## 2022-12-11 NOTE — Patient Instructions (Signed)
Medication Instructions:  STOP NEBIVOLOL *If you need a refill on your cardiac medications before your next appointment, please call your pharmacy*  Follow-Up: At [facility], you and your health needs are our priority.  As part of our continuing mission to provide you with exceptional heart care, we have created designated Provider Care Teams.  These Care Teams include your primary Cardiologist (physician) and Advanced Practice Providers (APPs -  Physician Assistants and Nurse Practitioners) who all work together to provide you with the care you need, when you need it.  We recommend signing up for the patient portal called "MyChart".  Sign up information is provided on this After Visit Summary.  MyChart is used to connect with patients for Virtual Visits (Telemedicine).  Patients are able to view lab/test results, encounter notes, upcoming appointments, etc.  Non-urgent messages can be sent to your provider as well.   To learn more about what you can do with MyChart, go to [email or web address].    Your next appointment:   3 month(s)  Provider:   Reatha Harps, MD

## 2022-12-24 ENCOUNTER — Emergency Department (HOSPITAL_COMMUNITY): Payer: Medicare Other

## 2022-12-24 ENCOUNTER — Inpatient Hospital Stay (HOSPITAL_COMMUNITY)
Admission: EM | Admit: 2022-12-24 | Discharge: 2022-12-31 | DRG: 183 | Disposition: A | Discharge status: Skilled Nursing Facility | Payer: Medicare Other | Source: Skilled Nursing Facility | Attending: Physician Assistant | Admitting: Physician Assistant

## 2022-12-24 ENCOUNTER — Other Ambulatory Visit: Payer: Self-pay

## 2022-12-24 DIAGNOSIS — E869 Volume depletion, unspecified: Secondary | ICD-10-CM | POA: Diagnosis not present

## 2022-12-24 DIAGNOSIS — Z888 Allergy status to other drugs, medicaments and biological substances status: Secondary | ICD-10-CM | POA: Diagnosis not present

## 2022-12-24 DIAGNOSIS — Z800 Family history of malignant neoplasm of digestive organs: Secondary | ICD-10-CM

## 2022-12-24 DIAGNOSIS — Z79899 Other long term (current) drug therapy: Secondary | ICD-10-CM | POA: Diagnosis not present

## 2022-12-24 DIAGNOSIS — J9811 Atelectasis: Secondary | ICD-10-CM | POA: Diagnosis not present

## 2022-12-24 DIAGNOSIS — M4856XD Collapsed vertebra, not elsewhere classified, lumbar region, subsequent encounter for fracture with routine healing: Secondary | ICD-10-CM | POA: Diagnosis not present

## 2022-12-24 DIAGNOSIS — I700 Atherosclerosis of aorta: Secondary | ICD-10-CM | POA: Diagnosis not present

## 2022-12-24 DIAGNOSIS — K5903 Drug induced constipation: Secondary | ICD-10-CM | POA: Diagnosis not present

## 2022-12-24 DIAGNOSIS — T40605A Adverse effect of unspecified narcotics, initial encounter: Secondary | ICD-10-CM | POA: Diagnosis not present

## 2022-12-24 DIAGNOSIS — Z87891 Personal history of nicotine dependence: Secondary | ICD-10-CM

## 2022-12-24 DIAGNOSIS — G4733 Obstructive sleep apnea (adult) (pediatric): Secondary | ICD-10-CM | POA: Diagnosis present

## 2022-12-24 DIAGNOSIS — Z7901 Long term (current) use of anticoagulants: Secondary | ICD-10-CM

## 2022-12-24 DIAGNOSIS — E875 Hyperkalemia: Secondary | ICD-10-CM | POA: Diagnosis not present

## 2022-12-24 DIAGNOSIS — I5033 Acute on chronic diastolic (congestive) heart failure: Secondary | ICD-10-CM | POA: Diagnosis present

## 2022-12-24 DIAGNOSIS — M4854XD Collapsed vertebra, not elsewhere classified, thoracic region, subsequent encounter for fracture with routine healing: Secondary | ICD-10-CM | POA: Diagnosis present

## 2022-12-24 DIAGNOSIS — S2249XA Multiple fractures of ribs, unspecified side, initial encounter for closed fracture: Secondary | ICD-10-CM | POA: Diagnosis not present

## 2022-12-24 DIAGNOSIS — Z8249 Family history of ischemic heart disease and other diseases of the circulatory system: Secondary | ICD-10-CM

## 2022-12-24 DIAGNOSIS — S2231XA Fracture of one rib, right side, initial encounter for closed fracture: Secondary | ICD-10-CM | POA: Diagnosis not present

## 2022-12-24 DIAGNOSIS — I480 Paroxysmal atrial fibrillation: Secondary | ICD-10-CM | POA: Diagnosis not present

## 2022-12-24 DIAGNOSIS — F39 Unspecified mood [affective] disorder: Secondary | ICD-10-CM | POA: Diagnosis present

## 2022-12-24 DIAGNOSIS — S2241XA Multiple fractures of ribs, right side, initial encounter for closed fracture: Secondary | ICD-10-CM | POA: Diagnosis not present

## 2022-12-24 DIAGNOSIS — F419 Anxiety disorder, unspecified: Secondary | ICD-10-CM | POA: Diagnosis present

## 2022-12-24 DIAGNOSIS — S199XXA Unspecified injury of neck, initial encounter: Secondary | ICD-10-CM | POA: Diagnosis not present

## 2022-12-24 DIAGNOSIS — S3993XA Unspecified injury of pelvis, initial encounter: Secondary | ICD-10-CM | POA: Diagnosis not present

## 2022-12-24 DIAGNOSIS — Z9181 History of falling: Secondary | ICD-10-CM | POA: Diagnosis not present

## 2022-12-24 DIAGNOSIS — Z853 Personal history of malignant neoplasm of breast: Secondary | ICD-10-CM

## 2022-12-24 DIAGNOSIS — Z881 Allergy status to other antibiotic agents status: Secondary | ICD-10-CM | POA: Diagnosis not present

## 2022-12-24 DIAGNOSIS — W19XXXA Unspecified fall, initial encounter: Secondary | ICD-10-CM | POA: Diagnosis not present

## 2022-12-24 DIAGNOSIS — S22080A Wedge compression fracture of T11-T12 vertebra, initial encounter for closed fracture: Secondary | ICD-10-CM | POA: Diagnosis not present

## 2022-12-24 DIAGNOSIS — S22089A Unspecified fracture of T11-T12 vertebra, initial encounter for closed fracture: Secondary | ICD-10-CM | POA: Diagnosis not present

## 2022-12-24 DIAGNOSIS — M40204 Unspecified kyphosis, thoracic region: Secondary | ICD-10-CM | POA: Diagnosis not present

## 2022-12-24 DIAGNOSIS — M5134 Other intervertebral disc degeneration, thoracic region: Secondary | ICD-10-CM | POA: Diagnosis not present

## 2022-12-24 DIAGNOSIS — I10 Essential (primary) hypertension: Secondary | ICD-10-CM | POA: Diagnosis not present

## 2022-12-24 DIAGNOSIS — E871 Hypo-osmolality and hyponatremia: Secondary | ICD-10-CM | POA: Diagnosis not present

## 2022-12-24 DIAGNOSIS — E222 Syndrome of inappropriate secretion of antidiuretic hormone: Secondary | ICD-10-CM | POA: Diagnosis present

## 2022-12-24 DIAGNOSIS — I110 Hypertensive heart disease with heart failure: Secondary | ICD-10-CM | POA: Diagnosis not present

## 2022-12-24 DIAGNOSIS — M549 Dorsalgia, unspecified: Secondary | ICD-10-CM | POA: Diagnosis not present

## 2022-12-24 DIAGNOSIS — E785 Hyperlipidemia, unspecified: Secondary | ICD-10-CM | POA: Diagnosis present

## 2022-12-24 DIAGNOSIS — N3000 Acute cystitis without hematuria: Secondary | ICD-10-CM

## 2022-12-24 DIAGNOSIS — M1990 Unspecified osteoarthritis, unspecified site: Secondary | ICD-10-CM | POA: Diagnosis not present

## 2022-12-24 DIAGNOSIS — S22009A Unspecified fracture of unspecified thoracic vertebra, initial encounter for closed fracture: Secondary | ICD-10-CM

## 2022-12-24 DIAGNOSIS — R0989 Other specified symptoms and signs involving the circulatory and respiratory systems: Secondary | ICD-10-CM | POA: Diagnosis not present

## 2022-12-24 DIAGNOSIS — I4819 Other persistent atrial fibrillation: Secondary | ICD-10-CM | POA: Diagnosis not present

## 2022-12-24 DIAGNOSIS — M4854XA Collapsed vertebra, not elsewhere classified, thoracic region, initial encounter for fracture: Secondary | ICD-10-CM | POA: Diagnosis not present

## 2022-12-24 DIAGNOSIS — M47816 Spondylosis without myelopathy or radiculopathy, lumbar region: Secondary | ICD-10-CM | POA: Diagnosis not present

## 2022-12-24 DIAGNOSIS — Z90711 Acquired absence of uterus with remaining cervical stump: Secondary | ICD-10-CM

## 2022-12-24 DIAGNOSIS — W010XXA Fall on same level from slipping, tripping and stumbling without subsequent striking against object, initial encounter: Secondary | ICD-10-CM | POA: Diagnosis present

## 2022-12-24 DIAGNOSIS — S0990XA Unspecified injury of head, initial encounter: Secondary | ICD-10-CM | POA: Diagnosis not present

## 2022-12-24 DIAGNOSIS — S22068A Other fracture of T7-T8 thoracic vertebra, initial encounter for closed fracture: Secondary | ICD-10-CM | POA: Diagnosis not present

## 2022-12-24 DIAGNOSIS — S22080S Wedge compression fracture of T11-T12 vertebra, sequela: Secondary | ICD-10-CM

## 2022-12-24 LAB — I-STAT CHEM 8, ED
BUN: 16 mg/dL (ref 8–23)
Calcium, Ion: 1.12 mmol/L — ABNORMAL LOW (ref 1.15–1.40)
Chloride: 97 mmol/L — ABNORMAL LOW (ref 98–111)
Creatinine, Ser: 0.7 mg/dL (ref 0.44–1.00)
Glucose, Bld: 119 mg/dL — ABNORMAL HIGH (ref 70–99)
HCT: 47 % — ABNORMAL HIGH (ref 36.0–46.0)
Hemoglobin: 16 g/dL — ABNORMAL HIGH (ref 12.0–15.0)
Potassium: 4.6 mmol/L (ref 3.5–5.1)
Sodium: 129 mmol/L — ABNORMAL LOW (ref 135–145)
TCO2: 23 mmol/L (ref 22–32)

## 2022-12-24 LAB — CBC
HCT: 43.6 % (ref 36.0–46.0)
Hemoglobin: 15.1 g/dL — ABNORMAL HIGH (ref 12.0–15.0)
MCH: 33.3 pg (ref 26.0–34.0)
MCHC: 34.6 g/dL (ref 30.0–36.0)
MCV: 96.2 fL (ref 80.0–100.0)
Platelets: 331 10*3/uL (ref 150–400)
RBC: 4.53 MIL/uL (ref 3.87–5.11)
RDW: 15.3 % (ref 11.5–15.5)
WBC: 9.7 10*3/uL (ref 4.0–10.5)
nRBC: 0 % (ref 0.0–0.2)

## 2022-12-24 LAB — COMPREHENSIVE METABOLIC PANEL
ALT: 16 U/L (ref 0–44)
AST: 30 U/L (ref 15–41)
Albumin: 3.8 g/dL (ref 3.5–5.0)
Alkaline Phosphatase: 74 U/L (ref 38–126)
Anion gap: 11 (ref 5–15)
BUN: 15 mg/dL (ref 8–23)
CO2: 23 mmol/L (ref 22–32)
Calcium: 9.5 mg/dL (ref 8.9–10.3)
Chloride: 95 mmol/L — ABNORMAL LOW (ref 98–111)
Creatinine, Ser: 0.72 mg/dL (ref 0.44–1.00)
GFR, Estimated: >60 mL/min (ref >60)
Glucose, Bld: 122 mg/dL — ABNORMAL HIGH (ref 70–99)
Potassium: 4.8 mmol/L (ref 3.5–5.1)
Sodium: 129 mmol/L — ABNORMAL LOW (ref 135–145)
Total Bilirubin: 0.8 mg/dL (ref 0.3–1.2)
Total Protein: 6.4 g/dL — ABNORMAL LOW (ref 6.5–8.1)

## 2022-12-24 LAB — PROTIME-INR
INR: 1.4 — ABNORMAL HIGH (ref 0.8–1.2)
Prothrombin Time: 17.4 seconds — ABNORMAL HIGH (ref 11.4–15.2)

## 2022-12-24 LAB — URINALYSIS, ROUTINE W REFLEX MICROSCOPIC
Bilirubin Urine: NEGATIVE
Glucose, UA: NEGATIVE mg/dL
Hgb urine dipstick: NEGATIVE
Ketones, ur: 5 mg/dL — AB
Nitrite: NEGATIVE
Protein, ur: NEGATIVE mg/dL
Specific Gravity, Urine: 1.012 (ref 1.005–1.030)
pH: 6 (ref 5.0–8.0)

## 2022-12-24 LAB — LACTIC ACID, PLASMA: Lactic Acid, Venous: 1.6 mmol/L (ref 0.5–1.9)

## 2022-12-24 MED ORDER — ACETAMINOPHEN 500 MG PO TABS
1000.0000 mg | ORAL_TABLET | Freq: Four times a day (QID) | ORAL | Status: DC
  Administered 2022-12-24 – 2022-12-31 (×23): 1000 mg via ORAL
  Filled 2022-12-24 (×23): qty 2

## 2022-12-24 MED ORDER — SODIUM CHLORIDE 0.9 % IV SOLN
1.0000 g | Freq: Once | INTRAVENOUS | Status: AC
  Administered 2022-12-24: 1 g via INTRAVENOUS
  Filled 2022-12-24: qty 10

## 2022-12-24 MED ORDER — KETOROLAC TROMETHAMINE 15 MG/ML IJ SOLN
15.0000 mg | Freq: Four times a day (QID) | INTRAMUSCULAR | Status: DC
  Administered 2022-12-24 – 2022-12-29 (×19): 15 mg via INTRAVENOUS
  Filled 2022-12-24 (×19): qty 1

## 2022-12-24 MED ORDER — HYDROMORPHONE HCL 1 MG/ML IJ SOLN: 0.5000 mg | Freq: Once | INTRAMUSCULAR | Status: DC

## 2022-12-24 MED ORDER — METHOCARBAMOL 1000 MG/10ML IJ SOLN
500.0000 mg | Freq: Three times a day (TID) | INTRAVENOUS | Status: AC
  Administered 2022-12-24: 500 mg via INTRAVENOUS
  Filled 2022-12-24: qty 500

## 2022-12-24 MED ORDER — METHOCARBAMOL 500 MG PO TABS
500.0000 mg | ORAL_TABLET | Freq: Three times a day (TID) | ORAL | Status: AC
  Administered 2022-12-25 – 2022-12-27 (×8): 500 mg via ORAL
  Filled 2022-12-24 (×8): qty 1

## 2022-12-24 MED ORDER — DOCUSATE SODIUM 100 MG PO CAPS
100.0000 mg | ORAL_CAPSULE | Freq: Two times a day (BID) | ORAL | Status: DC
  Administered 2022-12-24 – 2022-12-29 (×10): 100 mg via ORAL
  Filled 2022-12-24 (×10): qty 1

## 2022-12-24 MED ORDER — POLYETHYLENE GLYCOL 3350 17 G PO PACK: 17.0000 g | PACK | Freq: Every day | ORAL | Status: DC | PRN

## 2022-12-24 MED ORDER — ENOXAPARIN SODIUM 30 MG/0.3ML IJ SOSY
30.0000 mg | PREFILLED_SYRINGE | Freq: Two times a day (BID) | INTRAMUSCULAR | Status: DC
  Administered 2022-12-25: 30 mg via SUBCUTANEOUS
  Filled 2022-12-24: qty 0.3

## 2022-12-24 MED ORDER — METOPROLOL TARTRATE 5 MG/5ML IV SOLN: 5.0000 mg | Freq: Four times a day (QID) | INTRAVENOUS | Status: DC | PRN

## 2022-12-24 MED ORDER — MORPHINE SULFATE (PF) 2 MG/ML IV SOLN
2.0000 mg | Freq: Once | INTRAVENOUS | Status: AC
  Administered 2022-12-24: 2 mg via INTRAVENOUS
  Filled 2022-12-24: qty 1

## 2022-12-24 MED ORDER — MORPHINE SULFATE (PF) 2 MG/ML IV SOLN: 2.0000 mg | INTRAVENOUS | Status: DC | PRN

## 2022-12-24 MED ORDER — OXYCODONE HCL 5 MG PO TABS: 5.0000 mg | ORAL_TABLET | ORAL | Status: DC | PRN

## 2022-12-24 MED ORDER — ONDANSETRON HCL 4 MG/2ML IJ SOLN
4.0000 mg | Freq: Four times a day (QID) | INTRAMUSCULAR | Status: DC | PRN
  Administered 2022-12-24: 4 mg via INTRAVENOUS
  Filled 2022-12-24: qty 2

## 2022-12-24 MED ORDER — ONDANSETRON 4 MG PO TBDP: 4.0000 mg | ORAL_TABLET | Freq: Four times a day (QID) | ORAL | Status: DC | PRN

## 2022-12-24 MED ORDER — LIDOCAINE 5 % EX PTCH
1.0000 | MEDICATED_PATCH | CUTANEOUS | Status: DC
  Administered 2022-12-24 – 2022-12-28 (×5): 1 via TRANSDERMAL
  Filled 2022-12-24 (×5): qty 1

## 2022-12-24 MED ORDER — HYDRALAZINE HCL 20 MG/ML IJ SOLN: 10.0000 mg | INTRAMUSCULAR | Status: DC | PRN

## 2022-12-24 NOTE — ED Triage Notes (Signed)
Pt BIBGEMS from home at [facility]. Pt had a mechanical fall and hit a folding table. Unsure if patient hit head, but upon EMS arrival [facility] 15. Pt is on eliquis for afib. Skin tear on the left side and t spine tenderness. Pt had a fall in march with a compression fracture in the lumbar spine chronic.    118 sbp

## 2022-12-24 NOTE — Progress Notes (Signed)
Orthopedic Tech Progress Note Patient Details:  Whitney Burton 05-11-1933 [id number]  Level 2 trauma   Patient ID: Whitney Burton, female   DOB: 1933-03-30, 89 y.o.   MRN: [id number]  Whitney Burton 12/24/2022, 5:52 PM

## 2022-12-24 NOTE — ED Provider Notes (Signed)
[facility] AT [facility] Provider Note   CSN: [id number] Arrival date & time: 12/24/22  1720     History  Chief Complaint  Patient presents with   Whitney Burton    Whitney Burton is a 89 y.o. female with A-fib on Eliquis, arthritis, OSA, history of lumbar spinal compression fracture, HTN, HLD, who presents BIBGEMS from home at [facility] as a level 2 trauma after a fall at her facility.  She states he "just lost her balance" which is what made her fall, this is happened before.  She otherwise has been in her normal state of health.  She endorses acute onset midline thoracic back pain which started after the fall.  She does think she hit her head but did not lose consciousness.  Denies any pain anywhere else.  Denies recent fever/chills .Pt had a fall in march with a compression fracture in the lumbar spine chronic.     HPI     Home Medications Prior to Admission medications   Medication Sig Start Date End Date Taking? Authorizing Provider  acetaminophen (TYLENOL) 500 MG tablet 1 tablet as needed    [provider]  ALPRAZolam (XANAX) 0.25 MG tablet Take 0.25 mg by mouth as needed for anxiety.    [provider]  Cholecalciferol (VITAMIN D3) 25 MCG (1000 UT) CAPS Take by mouth. Take 1000 IU daily    [provider]  citalopram (CELEXA) 10 MG tablet 1 tablet Orally Once a day for 30 days 10/22/22   Alyson Reedy, FNP  ELIQUIS 5 MG TABS tablet TAKE 1 TABLET BY MOUTH TWICE A DAY 10/02/22   O'Neal, Ronnald Ramp, MD  spironolactone (ALDACTONE) 25 MG tablet Take 1 tablet (25 mg total) by mouth daily. Patient taking differently: Take 12.5 mg by mouth daily. Take 1/2 Tablet Daily 02/24/22   Sande Rives, MD  TIADYLT ER 240 MG 24 hr capsule TAKE 1 CAPSULE BY MOUTH EVERY DAY 04/07/22   O'Neal, Ronnald Ramp, MD  torsemide (DEMADEX) 20 MG tablet Take 1 tablet (20 mg total) by mouth as needed (Edema). 12/11/21   O'Neal, Ronnald Ramp, MD  traMADol (ULTRAM) 50 MG tablet Take 50 mg by mouth every 6 (six) hours as needed. Patient not taking: Reported on 12/11/2022 09/17/22   [provider]      Allergies    Ciprofloxacin hcl, Levofloxacin, Ondansetron hcl, and Furosemide    Review of Systems   Review of Systems A 10 point review of systems was performed and is negative unless otherwise reported in HPI.  Physical Exam Updated Vital Signs BP (!) 135/106 (BP Location: Right Arm)   Pulse 97   Temp 97.6 F (36.4 C) (Oral)   Resp 20   SpO2 92%  Physical Exam  PRIMARY SURVEY  Airway Airway intact  Breathing Bilateral breath sounds  Circulation Carotid/femoral pulses 2+ intact bilaterally  GCS E =  4 V =  5 M =  6 Total = 15  Environment All clothes removed      SECONDARY SURVEY  Gen: -NAD  HEENT: -Head: NCAT. Scalp is clear of lacerations or wounds. Skull is clear of deformities or depressions -Forehead: Normal -Midface: Stable -Eyes: No visible injury to eyelids or eye, PERRL, EOMI -Nose: No gross deformities -Mouth: No injuries to lips, tongue or teeth -Ears: No auricular hematoma -Neck: Trachea is midline, no distended neck veins  Chest: -No tenderness, deformities, bruising or crepitus to clavicles or chest -Normal chest expansion -  Normal heart sounds, S1/S2 normal, no m/r/g -No wheezes, rales, rhonchi  Abdomen: -No tenderness, bruising or penetrating injury  Pelvis: -Pelvis is stable and non-tender  Extremities: Right Upper Extremity: -No point tenderness, deformity or other signs of injury -Radial pulse intact RUE, cap refill good -Normal sensation -Normal ROM, good strength Left Upper Extremity: -No point tenderness, deformity or other signs of injury -Radial pulse intact LUE, cap refill good -Normal sensation -Normal ROM, good strength Right Lower Extremity: -No point tenderness, deformity or other signs of injury -DP intact RLE -Normal sensation -Normal ROM, good  strength Left Lower Extremity: -No point tenderness, deformity or other signs of injury -DP intact LLE -Normal sensation -Normal ROM, good strength  Back/Spine: -Midline T spine tenderness to palpation with no step-offs  Other: N/A     ED Results / Procedures / Treatments   Labs (all labs ordered are listed, but only abnormal results are displayed) Labs Reviewed  COMPREHENSIVE METABOLIC PANEL - Abnormal; Notable for the following components:      Result Value   Sodium 129 (*)    Chloride 95 (*)    Glucose, Bld 122 (*)    Total Protein 6.4 (*)    All other components within normal limits  CBC - Abnormal; Notable for the following components:   Hemoglobin 15.1 (*)    All other components within normal limits  URINALYSIS, ROUTINE W REFLEX MICROSCOPIC - Abnormal; Notable for the following components:   APPearance HAZY (*)    Ketones, ur 5 (*)    Leukocytes,Ua LARGE (*)    Bacteria, UA RARE (*)    All other components within normal limits  PROTIME-INR - Abnormal; Notable for the following components:   Prothrombin Time 17.4 (*)    INR 1.4 (*)    All other components within normal limits  I-STAT CHEM 8, ED - Abnormal; Notable for the following components:   Sodium 129 (*)    Chloride 97 (*)    Glucose, Bld 119 (*)    Calcium, Ion 1.12 (*)    Hemoglobin 16.0 (*)    HCT 47.0 (*)    All other components within normal limits  LACTIC ACID, PLASMA  CBC  BASIC METABOLIC PANEL    EKG EKG Interpretation Date/Time:  Wednesday December 24 2022 17:52:52 EDT Ventricular Rate:  92 PR Interval:    QRS Duration:  92 QT Interval:  390 QTC Calculation: 439 R Axis:   -79  Text Interpretation: Atrial fibrillation Left anterior fascicular block Confirmed by Vivi Barrack [phone]) on 12/24/2022 6:00:11 PM  Radiology CT Thoracic Spine Wo Contrast  Result Date: 12/24/2022 CLINICAL DATA:  Spine fracture, thoracic, traumatic EXAM: CT THORACIC SPINE WITHOUT CONTRAST TECHNIQUE: Multidetector  CT images of the thoracic were obtained using the standard protocol without intravenous contrast. RADIATION DOSE REDUCTION: This exam was performed according to the departmental dose-optimization program which includes automated exposure control, adjustment of the mA and/or kV according to patient size and/or use of iterative reconstruction technique. COMPARISON:  Chest CT reformats 11/13/2022 FINDINGS: Alignment: Exaggerated thoracic kyphosis. Retropulsion of T12 fracture is assessed better on concurrent lumbar spine, reported separately. Vertebrae: T12 compression fracture is better assessed on concurrent lumbar spine CT, reported separately. Acute nondisplaced fractures of right T8 and T9 transverse processes. No extension into the middle or anterior column. Chronic T5 hemangioma with inferior Schmorl's node. No suspicious bone lesion. Paraspinal and other soft tissues: Displaced posterior right ninth and tenth rib fractures. Trace right pleural effusion. Disc levels: Occasional  disc space narrowing without high-grade canal stenosis. Bony retropulsion of T12 of 5 mm causes mass effect on the canal. IMPRESSION: 1. Acute nondisplaced fractures of right T8 and T9 transverse processes. 2. Displaced posterior right ninth and tenth rib fractures. 3. T12 fracture is better assessed on concurrent lumbar spine exam, stable from May. Electronically Signed   By: Narda Rutherford M.D.   On: 12/24/2022 18:43   CT Lumbar Spine Wo Contrast  Result Date: 12/24/2022 CLINICAL DATA:  Back trauma, no prior imaging (Age >= 16y) Fall. EXAM: CT LUMBAR SPINE WITHOUT CONTRAST TECHNIQUE: Multidetector CT imaging of the lumbar spine was performed without intravenous contrast administration. Multiplanar CT image reconstructions were also generated. RADIATION DOSE REDUCTION: This exam was performed according to the departmental dose-optimization program which includes automated exposure control, adjustment of the mA and/or kV according  to patient size and/or use of iterative reconstruction technique. COMPARISON:  Lumbar spine CT 09/05/2022, reformats from CT a 11/13/2022 FINDINGS: Segmentation: 5 lumbar type vertebrae. Alignment: T12 fracture has progressed from prior exam with bony retropulsion of 5 mm. Lumbar alignment is normal. Vertebrae: T12 compression fracture is progressed from March exam, but is stable from May. There is 65% loss of height centrally with bony retropulsion of 5 mm. No involvement of the posterior elements. No acute fracture of the lumbar spine. Unfused left L1 transverse process. The included sacrum is intact. Paraspinal and other soft tissues: Aortic atherosclerosis. No acute findings. Chronic subcapsular structure in the liver partially included, but grossly stable from prior imaging. Disc levels: Mild for age degenerative change without high-grade canal stenosis. IMPRESSION: 1. T12 compression fracture has progressed from March exam, but is stable from May. There is 65% loss of height centrally with bony retropulsion of 5 mm. 2. No acute fracture of the lumbar spine. Aortic Atherosclerosis (ICD10-I70.0). Electronically Signed   By: Narda Rutherford M.D.   On: 12/24/2022 18:37   CT Cervical Spine Wo Contrast  Result Date: 12/24/2022 CLINICAL DATA:  Neck trauma (Age >= 65y) EXAM: CT CERVICAL SPINE WITHOUT CONTRAST TECHNIQUE: Multidetector CT imaging of the cervical spine was performed without intravenous contrast. Multiplanar CT image reconstructions were also generated. RADIATION DOSE REDUCTION: This exam was performed according to the departmental dose-optimization program which includes automated exposure control, adjustment of the mA and/or kV according to patient size and/or use of iterative reconstruction technique. COMPARISON:  09/05/2022 FINDINGS: Alignment: Normal Skull base and vertebrae: No acute fracture. No primary bone lesion or focal pathologic process. Soft tissues and spinal canal: No prevertebral  fluid or swelling. No visible canal hematoma. Disc levels:  Disc spaces maintained.  No disc herniation. Upper chest: No acute findings Other: None IMPRESSION: No acute bony abnormality. Electronically Signed   By: Charlett Nose M.D.   On: 12/24/2022 18:32   CT Head Wo Contrast  Result Date: 12/24/2022 CLINICAL DATA:  Head trauma, minor (Age >= 65y) EXAM: CT HEAD WITHOUT CONTRAST TECHNIQUE: Contiguous axial images were obtained from the base of the skull through the vertex without intravenous contrast. RADIATION DOSE REDUCTION: This exam was performed according to the departmental dose-optimization program which includes automated exposure control, adjustment of the mA and/or kV according to patient size and/or use of iterative reconstruction technique. COMPARISON:  09/05/2022 FINDINGS: Brain: There is atrophy and chronic small vessel disease changes. No acute intracranial abnormality. Specifically, no hemorrhage, hydrocephalus, mass lesion, acute infarction, or significant intracranial injury. Vascular: No hyperdense vessel or unexpected calcification. Skull: No acute calvarial abnormality. Sinuses/Orbits: No acute findings  Other: None IMPRESSION: Atrophy, chronic microvascular disease. No acute intracranial abnormality. Electronically Signed   By: Charlett Nose M.D.   On: 12/24/2022 18:30   DG Chest Port 1 View  Result Date: 12/24/2022 CLINICAL DATA:  Fall, trauma EXAM: PORTABLE CHEST 1 VIEW COMPARISON:  01/19/2022 FINDINGS: Remote T12 compression fracture as shown on radiographs 11/13/2022. Atherosclerotic calcification of the aortic arch. Low lung volumes are present, causing crowding of the pulmonary vasculature. Moderate enlargement of the cardiopericardial silhouette. Old healed right posterolateral rib fractures. In addition there is suspected discontinuity of the right lateral eighth rib most compatible with a displaced rib fracture. The lungs appear clear.  No blunting of the costophrenic angles.  IMPRESSION: 1. Acute displaced right lateral eighth rib fracture. 2. Old healed right posterolateral fifth and sixth rib fractures. 3. Moderate enlargement of the cardiopericardial silhouette. 4. Low lung volumes. 5. Remote T12 compression fracture. Electronically Signed   By: Gaylyn Rong M.D.   On: 12/24/2022 18:00   DG Pelvis Portable  Result Date: 12/24/2022 CLINICAL DATA:  Trauma, fall EXAM: PORTABLE PELVIS 1-2 VIEWS COMPARISON:  None Available. FINDINGS: No displaced fracture or dislocation is seen. Joint spaces in both hips appear symmetrical. Few small smoothly marginated calcifications are noted adjacent to the left ischial suggesting possible calcific bursitis. IMPRESSION: No fracture or dislocation is seen in pelvis. Electronically Signed   By: Ernie Avena M.D.   On: 12/24/2022 17:54    Procedures Procedures    Medications Ordered in ED Medications  acetaminophen (TYLENOL) tablet 1,000 mg (1,000 mg Oral Given 12/24/22 2240)  oxyCODONE (Oxy IR/ROXICODONE) immediate release tablet 5-10 mg (has no administration in time range)  morphine (PF) 2 MG/ML injection 2 mg (has no administration in time range)  methocarbamol (ROBAXIN) tablet 500 mg ( Oral See Alternative 12/24/22 2328)    Or  methocarbamol (ROBAXIN) 500 mg in dextrose 5 % 50 mL IVPB (500 mg Intravenous New Bag/Given 12/24/22 2328)  docusate sodium (COLACE) capsule 100 mg (100 mg Oral Given 12/24/22 2240)  polyethylene glycol (MIRALAX / GLYCOLAX) packet 17 g (has no administration in time range)  ondansetron (ZOFRAN-ODT) disintegrating tablet 4 mg ( Oral See Alternative 12/24/22 2330)    Or  ondansetron (ZOFRAN) injection 4 mg (4 mg Intravenous Given 12/24/22 2330)  metoprolol tartrate (LOPRESSOR) injection 5 mg (has no administration in time range)  hydrALAZINE (APRESOLINE) injection 10 mg (has no administration in time range)  enoxaparin (LOVENOX) injection 30 mg (has no administration in time range)  ketorolac  (TORADOL) 15 MG/ML injection 15 mg (15 mg Intravenous Given 12/24/22 2329)  lidocaine (LIDODERM) 5 % 1 patch (1 patch Transdermal Patch Applied 12/24/22 2241)  morphine (PF) 2 MG/ML injection 2 mg (2 mg Intravenous Given 12/24/22 1753)  morphine (PF) 2 MG/ML injection 2 mg (2 mg Intravenous Given 12/24/22 2045)  cefTRIAXone (ROCEPHIN) 1 g in sodium chloride 0.9 % 100 mL IVPB (1 g Intravenous New Bag/Given 12/24/22 2239)    ED Course/ Medical Decision Making/ A&P                          Medical Decision Making Amount and/or Complexity of Data Reviewed Labs: ordered. Decision-making details documented in ED Course. Radiology: ordered. Decision-making details documented in ED Course.  Risk Prescription drug management. Decision regarding hospitalization.    This patient presents to the ED for concern of fall on thinners, back pain; this involves an extensive number of treatment options, and is a  complaint that carries with it a high risk of complications and morbidity.  I considered the following differential and admission for this acute, potentially life threatening condition.   MDM:    DDX for trauma includes but is not limited to:  -Head Injury such as skull fx or ICH -Chest Injury and Abdominal Injury - no chest/abd pain, no CVA TTP -Spinal Cord or Vertebral injury - no FNDs, no c/f spinal cord injury, but does have midline back pain w/ h/o fracture, will get CTs of spine -Fractures   Clinical Course as of 12/24/22 2336  Wed Dec 24, 2022  1912 CT Thoracic Spine Wo Contrast 1. Acute nondisplaced fractures of right T8 and T9 transverse processes. 2. Displaced posterior right ninth and tenth rib fractures. 3. T12 fracture is better assessed on concurrent lumbar spine exam, stable from May.   [HN]  1912 CT Lumbar Spine Wo Contrast 1. T12 compression fracture has progressed from March exam, but is stable from May. There is 65% loss of height centrally with bony retropulsion of 5  mm. 2. No acute fracture of the lumbar spine.  Aortic Atherosclerosis (ICD10-I70.0).   [HN]  1913 Consulted to [facility] [HN]  1939 D/w Aundra Millet NP w/ Dr. Lovell Sheehan from [facility]. Recommended LSO brace for T12, which patient has at home, and f/u in office. She also has T8-9 TP fractures, nondisplaced, and displaced posterior R 9-10 rib fracture.  [HN]  2130 Urinalysis, Routine w reflex microscopic -Urine, Clean Catch(!) +UTI, will treat as well [HN]  2204 Pt reevaluated. She is in severe pain, splinting respirations. Patient states she lives alone, cannot sit up or walk. Will need admission for pain control. Consulted to trauma Dr. Bedelia Person and admitted. Also added ceftriaxone IV for UTI. [HN]    Clinical Course User Index [HN] Loetta Rough, MD    Labs: I Ordered, and personally interpreted labs.  The pertinent results include:  those listed above  Imaging Studies ordered: I ordered imaging studies including CXR, PXR, CTH, CT C/T/L spine I independently visualized and interpreted imaging. I agree with the radiologist interpretation  Additional history obtained from EMS, chart review.   Cardiac Monitoring: The patient was maintained on a cardiac monitor.  I personally viewed and interpreted the cardiac monitored which showed an underlying rhythm of: Afib rate controlled  Reevaluation: After the interventions noted above, I reevaluated the patient and found that they have :stayed the same  Social Determinants of Health: Lives at ALF  Disposition:  Admit  Co morbidities that complicate the patient evaluation  Past Medical History:  Diagnosis Date   A-fib (HCC)    Anxiety    Arrhythmia    Breast cancer (HCC)    Left Breast   Hypertension      Medicines Meds ordered this encounter  Medications   morphine (PF) 2 MG/ML injection 2 mg   morphine (PF) 2 MG/ML injection 2 mg   DISCONTD: HYDROmorphone (DILAUDID) injection 0.5 mg   acetaminophen (TYLENOL) tablet 1,000 mg   oxyCODONE  (Oxy IR/ROXICODONE) immediate release tablet 5-10 mg   morphine (PF) 2 MG/ML injection 2 mg   OR Linked Order Group    methocarbamol (ROBAXIN) tablet 500 mg    methocarbamol (ROBAXIN) 500 mg in dextrose 5 % 50 mL IVPB   docusate sodium (COLACE) capsule 100 mg   polyethylene glycol (MIRALAX / GLYCOLAX) packet 17 g   OR Linked Order Group    ondansetron (ZOFRAN-ODT) disintegrating tablet 4 mg    ondansetron (ZOFRAN)  injection 4 mg   metoprolol tartrate (LOPRESSOR) injection 5 mg   hydrALAZINE (APRESOLINE) injection 10 mg   enoxaparin (LOVENOX) injection 30 mg   ketorolac (TORADOL) 15 MG/ML injection 15 mg   cefTRIAXone (ROCEPHIN) 1 g in sodium chloride 0.9 % 100 mL IVPB    Order Specific Question:   Antibiotic Indication:    Answer:   UTI   lidocaine (LIDODERM) 5 % 1 patch   DISCONTD: HYDROmorphone (DILAUDID) injection 0.5 mg    I have reviewed the patients home medicines and have made adjustments as needed  Problem List / ED Course: Problem List Items Addressed This Visit       Musculoskeletal and Integument   * (Principal) Rib fractures - Primary   Other Visit Diagnoses     Closed fracture of transverse process of thoracic vertebra, initial encounter (HCC)       Compression fracture of T12 vertebra, sequela       Fall, initial encounter       Acute cystitis without hematuria                       This note was created using dictation software, which may contain spelling or grammatical errors.    Loetta Rough, MD 12/24/22 [phone]

## 2022-12-24 NOTE — H&P (Signed)
Reason for Consult/Chief Complaint: fall  Consultant: Jearld Fenton, MD  Whitney Burton is an 89 y.o. female.   HPI: 3F s/p mechanical GLF, reports losing her balance. Lives at an independent living facility. Reports landing on her back. Daughter lives locally.    Past Medical History:  Diagnosis Date   A-fib [facility])    Anxiety    Arrhythmia    Breast cancer (HCC)    Left Breast   Hypertension     Past Surgical History:  Procedure Laterality Date   HEMORRHOID SURGERY     LIVER SURGERY     found lumps in late 51s   PARTIAL HYSTERECTOMY      Family History  Problem Relation Age of Onset   Heart attack Father    Pancreatic cancer Brother    Colon cancer Neg Hx    Rectal cancer Neg Hx    Stomach cancer Neg Hx    Esophageal cancer Neg Hx    Liver cancer Neg Hx     Social History:  reports that she has quit smoking. She has never used smokeless tobacco. She reports current alcohol use of about 1.0 standard drink of alcohol per week. She reports that she does not use drugs.  Allergies:  Allergies  Allergen Reactions   Ciprofloxacin Hcl     Other reaction(s): Unknown   Levofloxacin     Other reaction(s): Unknown   Ondansetron Hcl     Other reaction(s): constipation   Furosemide Itching    Itching after taking Lasix?    Medications: I have reviewed the patient's current medications.  Results for orders placed or performed during the hospital encounter of 12/24/22 (from the past 48 hour(s))  Urinalysis, Routine w reflex microscopic -Urine, Clean Catch     Status: Abnormal   Collection Time: 12/24/22  5:41 PM  Result Value Ref Range   Color, Urine YELLOW YELLOW   APPearance HAZY (A) CLEAR   Specific Gravity, Urine 1.012 1.005 - 1.030   pH 6.0 5.0 - 8.0   Glucose, UA NEGATIVE NEGATIVE mg/dL   Hgb urine dipstick NEGATIVE NEGATIVE   Bilirubin Urine NEGATIVE NEGATIVE   Ketones, ur 5 (A) NEGATIVE mg/dL   Protein, ur NEGATIVE NEGATIVE mg/dL   Nitrite NEGATIVE NEGATIVE    Leukocytes,Ua LARGE (A) NEGATIVE   RBC / HPF 0-5 0 - 5 RBC/hpf   WBC, UA 21-50 0 - 5 WBC/hpf   Bacteria, UA RARE (A) NONE SEEN   Squamous Epithelial / HPF 0-5 0 - 5 /HPF    Comment: Performed at [facility], [address], [place], [place] [address]  Lactic acid, plasma     Status: None   Collection Time: 12/24/22  5:41 PM  Result Value Ref Range   Lactic Acid, Venous 1.6 0.5 - 1.9 mmol/L    Comment: Performed at [facility], [address], [place], [place] [address]  Comprehensive metabolic panel     Status: Abnormal   Collection Time: 12/24/22  5:52 PM  Result Value Ref Range   Sodium 129 (L) 135 - 145 mmol/L   Potassium 4.8 3.5 - 5.1 mmol/L    Comment: HEMOLYSIS AT THIS LEVEL MAY AFFECT RESULT   Chloride 95 (L) 98 - 111 mmol/L   CO2 23 22 - 32 mmol/L   Glucose, Bld 122 (H) 70 - 99 mg/dL    Comment: Glucose reference range applies only to samples taken after fasting for at least 8 hours.   BUN  15 8 - 23 mg/dL   Creatinine, Ser 1.61 0.44 - 1.00 mg/dL   Calcium 9.5 8.9 - 09.6 mg/dL   Total Protein 6.4 (L) 6.5 - 8.1 g/dL   Albumin 3.8 3.5 - 5.0 g/dL   AST 30 15 - 41 U/L    Comment: HEMOLYSIS AT THIS LEVEL MAY AFFECT RESULT   ALT 16 0 - 44 U/L    Comment: HEMOLYSIS AT THIS LEVEL MAY AFFECT RESULT   Alkaline Phosphatase 74 38 - 126 U/L   Total Bilirubin 0.8 0.3 - 1.2 mg/dL    Comment: HEMOLYSIS AT THIS LEVEL MAY AFFECT RESULT   GFR, Estimated >60 >60 mL/min    Comment: (NOTE) Calculated using the CKD-EPI Creatinine Equation (2021)    Anion gap 11 5 - 15    Comment: Performed at [facility], [address], [place], [place] [address]  CBC     Status: Abnormal   Collection Time: 12/24/22  5:52 PM  Result Value Ref Range   WBC 9.7 4.0 - 10.5 K/uL   RBC 4.53 3.87 - 5.11 MIL/uL   Hemoglobin 15.1 (H) 12.0 - 15.0 g/dL   HCT 98.1 19.1 - 47.8 %   MCV 96.2 80.0 - 100.0 fL   MCH 33.3 26.0 - 34.0 pg   MCHC 34.6 30.0 - 36.0 g/dL   RDW 29.5 62.1 - 30.8 %    Platelets 331 150 - 400 K/uL   nRBC 0.0 0.0 - 0.2 %    Comment: Performed at [facility], [address], [place], [place] [address]  Protime-INR     Status: Abnormal   Collection Time: 12/24/22  5:52 PM  Result Value Ref Range   Prothrombin Time 17.4 (H) 11.4 - 15.2 seconds   INR 1.4 (H) 0.8 - 1.2    Comment: (NOTE) INR goal varies based on device and disease states. Performed at [facility], [address], [place], [place] [address]   I-Stat Chem 8, ED     Status: Abnormal   Collection Time: 12/24/22  6:04 PM  Result Value Ref Range   Sodium 129 (L) 135 - 145 mmol/L   Potassium 4.6 3.5 - 5.1 mmol/L   Chloride 97 (L) 98 - 111 mmol/L   BUN 16 8 - 23 mg/dL   Creatinine, Ser 5.28 0.44 - 1.00 mg/dL   Glucose, Bld 413 (H) 70 - 99 mg/dL    Comment: Glucose reference range applies only to samples taken after fasting for at least 8 hours.   Calcium, Ion 1.12 (L) 1.15 - 1.40 mmol/L   TCO2 23 22 - 32 mmol/L   Hemoglobin 16.0 (H) 12.0 - 15.0 g/dL   HCT 24.4 (H) 01.0 - 27.2 %    CT Thoracic Spine Wo Contrast  Result Date: 12/24/2022 CLINICAL DATA:  Spine fracture, thoracic, traumatic EXAM: CT THORACIC SPINE WITHOUT CONTRAST TECHNIQUE: Multidetector CT images of the thoracic were obtained using the standard protocol without intravenous contrast. RADIATION DOSE REDUCTION: This exam was performed according to the departmental dose-optimization program which includes automated exposure control, adjustment of the mA and/or kV according to patient size and/or use of iterative reconstruction technique. COMPARISON:  Chest CT reformats 11/13/2022 FINDINGS: Alignment: Exaggerated thoracic kyphosis. Retropulsion of T12 fracture is assessed better on concurrent lumbar spine, reported separately. Vertebrae: T12 compression fracture is better assessed on concurrent lumbar spine CT, reported separately. Acute nondisplaced fractures of right T8 and T9 transverse processes. No extension into the  middle  or anterior column. Chronic T5 hemangioma with inferior Schmorl's node. No suspicious bone lesion. Paraspinal and other soft tissues: Displaced posterior right ninth and tenth rib fractures. Trace right pleural effusion. Disc levels: Occasional disc space narrowing without high-grade canal stenosis. Bony retropulsion of T12 of 5 mm causes mass effect on the canal. IMPRESSION: 1. Acute nondisplaced fractures of right T8 and T9 transverse processes. 2. Displaced posterior right ninth and tenth rib fractures. 3. T12 fracture is better assessed on concurrent lumbar spine exam, stable from May. Electronically Signed   By: Narda Rutherford M.D.   On: 12/24/2022 18:43   CT Lumbar Spine Wo Contrast  Result Date: 12/24/2022 CLINICAL DATA:  Back trauma, no prior imaging (Age >= 16y) Fall. EXAM: CT LUMBAR SPINE WITHOUT CONTRAST TECHNIQUE: Multidetector CT imaging of the lumbar spine was performed without intravenous contrast administration. Multiplanar CT image reconstructions were also generated. RADIATION DOSE REDUCTION: This exam was performed according to the departmental dose-optimization program which includes automated exposure control, adjustment of the mA and/or kV according to patient size and/or use of iterative reconstruction technique. COMPARISON:  Lumbar spine CT 09/05/2022, reformats from CT a 11/13/2022 FINDINGS: Segmentation: 5 lumbar type vertebrae. Alignment: T12 fracture has progressed from prior exam with bony retropulsion of 5 mm. Lumbar alignment is normal. Vertebrae: T12 compression fracture is progressed from March exam, but is stable from May. There is 65% loss of height centrally with bony retropulsion of 5 mm. No involvement of the posterior elements. No acute fracture of the lumbar spine. Unfused left L1 transverse process. The included sacrum is intact. Paraspinal and other soft tissues: Aortic atherosclerosis. No acute findings. Chronic subcapsular structure in the liver partially  included, but grossly stable from prior imaging. Disc levels: Mild for age degenerative change without high-grade canal stenosis. IMPRESSION: 1. T12 compression fracture has progressed from March exam, but is stable from May. There is 65% loss of height centrally with bony retropulsion of 5 mm. 2. No acute fracture of the lumbar spine. Aortic Atherosclerosis (ICD10-I70.0). Electronically Signed   By: Narda Rutherford M.D.   On: 12/24/2022 18:37   CT Cervical Spine Wo Contrast  Result Date: 12/24/2022 CLINICAL DATA:  Neck trauma (Age >= 65y) EXAM: CT CERVICAL SPINE WITHOUT CONTRAST TECHNIQUE: Multidetector CT imaging of the cervical spine was performed without intravenous contrast. Multiplanar CT image reconstructions were also generated. RADIATION DOSE REDUCTION: This exam was performed according to the departmental dose-optimization program which includes automated exposure control, adjustment of the mA and/or kV according to patient size and/or use of iterative reconstruction technique. COMPARISON:  09/05/2022 FINDINGS: Alignment: Normal Skull base and vertebrae: No acute fracture. No primary bone lesion or focal pathologic process. Soft tissues and spinal canal: No prevertebral fluid or swelling. No visible canal hematoma. Disc levels:  Disc spaces maintained.  No disc herniation. Upper chest: No acute findings Other: None IMPRESSION: No acute bony abnormality. Electronically Signed   By: Charlett Nose M.D.   On: 12/24/2022 18:32   CT Head Wo Contrast  Result Date: 12/24/2022 CLINICAL DATA:  Head trauma, minor (Age >= 65y) EXAM: CT HEAD WITHOUT CONTRAST TECHNIQUE: Contiguous axial images were obtained from the base of the skull through the vertex without intravenous contrast. RADIATION DOSE REDUCTION: This exam was performed according to the departmental dose-optimization program which includes automated exposure control, adjustment of the mA and/or kV according to patient size and/or use of iterative  reconstruction technique. COMPARISON:  09/05/2022 FINDINGS: Brain: There is atrophy and chronic small vessel  disease changes. No acute intracranial abnormality. Specifically, no hemorrhage, hydrocephalus, mass lesion, acute infarction, or significant intracranial injury. Vascular: No hyperdense vessel or unexpected calcification. Skull: No acute calvarial abnormality. Sinuses/Orbits: No acute findings Other: None IMPRESSION: Atrophy, chronic microvascular disease. No acute intracranial abnormality. Electronically Signed   By: Charlett Nose M.D.   On: 12/24/2022 18:30   DG Chest Port 1 View  Result Date: 12/24/2022 CLINICAL DATA:  Fall, trauma EXAM: PORTABLE CHEST 1 VIEW COMPARISON:  01/19/2022 FINDINGS: Remote T12 compression fracture as shown on radiographs 11/13/2022. Atherosclerotic calcification of the aortic arch. Low lung volumes are present, causing crowding of the pulmonary vasculature. Moderate enlargement of the cardiopericardial silhouette. Old healed right posterolateral rib fractures. In addition there is suspected discontinuity of the right lateral eighth rib most compatible with a displaced rib fracture. The lungs appear clear.  No blunting of the costophrenic angles. IMPRESSION: 1. Acute displaced right lateral eighth rib fracture. 2. Old healed right posterolateral fifth and sixth rib fractures. 3. Moderate enlargement of the cardiopericardial silhouette. 4. Low lung volumes. 5. Remote T12 compression fracture. Electronically Signed   By: Gaylyn Rong M.D.   On: 12/24/2022 18:00   DG Pelvis Portable  Result Date: 12/24/2022 CLINICAL DATA:  Trauma, fall EXAM: PORTABLE PELVIS 1-2 VIEWS COMPARISON:  None Available. FINDINGS: No displaced fracture or dislocation is seen. Joint spaces in both hips appear symmetrical. Few small smoothly marginated calcifications are noted adjacent to the left ischial suggesting possible calcific bursitis. IMPRESSION: No fracture or dislocation is seen in  pelvis. Electronically Signed   By: Ernie Avena M.D.   On: 12/24/2022 17:54    ROS 10 point review of systems is negative except as listed above in HPI.   Physical Exam Blood pressure 132/79, pulse 94, temperature 97.8 F (36.6 C), temperature source Oral, resp. rate (!) 28, SpO2 92 %. Constitutional: well-developed, well-nourished HEENT: pupils equal, round, reactive to light, 2mm b/l, moist conjunctiva, external inspection of ears and nose normal, hearing intact Oropharynx: normal oropharyngeal mucosa, poor dentition Neck: no thyromegaly, trachea midline, no midline cervical tenderness to palpation Chest: breath sounds equal bilaterally, normal respiratory effort, no midline or lateral chest wall tenderness to palpation/deformity Abdomen: soft, NT, no bruising, no hepatosplenomegaly GU: normal female genitalia  Rectal: deferred Extremities: 2+ radial and pedal pulses bilaterally, intact motor and sensation bilateral UE and LE, no peripheral edema MSK: unable to assess gait/station, no clubbing/cyanosis of fingers/toes, normal ROM of all four extremities Skin: warm, dry, no rashes Psych: normal memory, normal mood/affect     Assessment/Plan: 9F s/p mechanical GLF.   Rib fx - pain control, pulm toilet Subacute thoracic compression fx - pain control FEN - regular diet DVT - SCDs, LMWH Dispo - medsurg, therapies, goal is to get back to her previous living facility. Despite repeated explanations, does not seem to understand the need for adequate pain control to improve mobility/functional status and is currently declining narcotic pain medications.    Diamantina Monks, MD General and Trauma Surgery [facility] Surgery

## 2022-12-24 NOTE — ED Notes (Signed)
ED TO INPATIENT HANDOFF REPORT  ED Nurse Name and Phone #: Jesenya Bowditch, RN [phone]  S Name/Age/Gender Whitney Burton 89 y.o. female Room/Bed: 016C/016C  Code Status   Code Status: Full Code  Home/SNF/Other Nursing Home Patient oriented to: self, place, time, and situation Is this baseline? Yes   Triage Complete: Triage complete  Chief Complaint Rib fractures [S22.49XA]  Triage Note Pt BIBGEMS from home at [facility] assisted living. Pt had a mechanical fall and hit a folding table. Unsure if patient hit head, but upon EMS arrival GCS 15. Pt is on eliquis for afib. Skin tear on the left side and t spine tenderness. Pt had a fall in march with a compression fracture in the lumbar spine chronic.    118 sbp       Allergies Allergies  Allergen Reactions   Ciprofloxacin Hcl     Other reaction(s): Unknown   Levofloxacin     Other reaction(s): Unknown   Ondansetron Hcl     Other reaction(s): constipation   Furosemide Itching    Itching after taking Lasix?    Level of Care/Admitting Diagnosis ED Disposition     ED Disposition  Admit   Condition  --   Comment  Hospital Area: [facility] [[id number]]  Level of Care: Med-Surg [16]  May place patient in observation at [facility] or [facility] if equivalent level of care is available:: No  Covid Evaluation: Asymptomatic - no recent exposure (last 10 days) testing not required  Diagnosis: Rib fractures [119147]  Admitting Physician: TRAUMA MD [[id number]]  Attending Physician: TRAUMA MD [[id number]]  For patients discharging to extended facilities (i.e. SNF, AL, group homes or LTAC) initiate:: Discharge to SNF/Facility Placement COVID-19 Lab Testing Protocol          B Medical/Surgery History Past Medical History:  Diagnosis Date   A-fib (HCC)    Anxiety    Arrhythmia    Breast cancer (HCC)    Left Breast   Hypertension    Past Surgical History:  Procedure Laterality Date   HEMORRHOID SURGERY     LIVER SURGERY      found lumps in late 40s   PARTIAL HYSTERECTOMY       A IV Location/Drains/Wounds Patient Lines/Drains/Airways Status     Active Line/Drains/Airways     Name Placement date Placement time Site Days   Peripheral IV 12/24/22 Left Antecubital 12/24/22  1753  Antecubital  less than 1            Intake/Output Last 24 hours No intake or output data in the 24 hours ending 12/24/22 2231  Labs/Imaging Results for orders placed or performed during the hospital encounter of 12/24/22 (from the past 48 hour(s))  Urinalysis, Routine w reflex microscopic -Urine, Clean Catch     Status: Abnormal   Collection Time: 12/24/22  5:41 PM  Result Value Ref Range   Color, Urine YELLOW YELLOW   APPearance HAZY (A) CLEAR   Specific Gravity, Urine 1.012 1.005 - 1.030   pH 6.0 5.0 - 8.0   Glucose, UA NEGATIVE NEGATIVE mg/dL   Hgb urine dipstick NEGATIVE NEGATIVE   Bilirubin Urine NEGATIVE NEGATIVE   Ketones, ur 5 (A) NEGATIVE mg/dL   Protein, ur NEGATIVE NEGATIVE mg/dL   Nitrite NEGATIVE NEGATIVE   Leukocytes,Ua LARGE (A) NEGATIVE   RBC / HPF 0-5 0 - 5 RBC/hpf   WBC, UA 21-50 0 - 5 WBC/hpf   Bacteria, UA RARE (A) NONE SEEN   Squamous Epithelial /  HPF 0-5 0 - 5 /HPF    Comment: Performed at [facility], [address], [place], [place] [address]  Lactic acid, plasma     Status: None   Collection Time: 12/24/22  5:41 PM  Result Value Ref Range   Lactic Acid, Venous 1.6 0.5 - 1.9 mmol/L    Comment: Performed at [facility], [address], [place], [place] [address]  Comprehensive metabolic panel     Status: Abnormal   Collection Time: 12/24/22  5:52 PM  Result Value Ref Range   Sodium 129 (L) 135 - 145 mmol/L   Potassium 4.8 3.5 - 5.1 mmol/L    Comment: HEMOLYSIS AT THIS LEVEL MAY AFFECT RESULT   Chloride 95 (L) 98 - 111 mmol/L   CO2 23 22 - 32 mmol/L   Glucose, Bld 122 (H) 70 - 99 mg/dL    Comment: Glucose reference range applies only to samples taken after fasting for at  least 8 hours.   BUN 15 8 - 23 mg/dL   Creatinine, Ser 8.29 0.44 - 1.00 mg/dL   Calcium 9.5 8.9 - 56.2 mg/dL   Total Protein 6.4 (L) 6.5 - 8.1 g/dL   Albumin 3.8 3.5 - 5.0 g/dL   AST 30 15 - 41 U/L    Comment: HEMOLYSIS AT THIS LEVEL MAY AFFECT RESULT   ALT 16 0 - 44 U/L    Comment: HEMOLYSIS AT THIS LEVEL MAY AFFECT RESULT   Alkaline Phosphatase 74 38 - 126 U/L   Total Bilirubin 0.8 0.3 - 1.2 mg/dL    Comment: HEMOLYSIS AT THIS LEVEL MAY AFFECT RESULT   GFR, Estimated >60 >60 mL/min    Comment: (NOTE) Calculated using the CKD-EPI Creatinine Equation (2021)    Anion gap 11 5 - 15    Comment: Performed at [facility], [address] [place], [place] [address]  CBC     Status: Abnormal   Collection Time: 12/24/22  5:52 PM  Result Value Ref Range   WBC 9.7 4.0 - 10.5 K/uL   RBC 4.53 3.87 - 5.11 MIL/uL   Hemoglobin 15.1 (H) 12.0 - 15.0 g/dL   HCT 57.8 46.9 - 62.9 %   MCV 96.2 80.0 - 100.0 fL   MCH 33.3 26.0 - 34.0 pg   MCHC 34.6 30.0 - 36.0 g/dL   RDW 52.8 41.3 - 24.4 %   Platelets 331 150 - 400 K/uL   nRBC 0.0 0.0 - 0.2 %    Comment: Performed at [facility], [address], [place], [place] [address]  Protime-INR     Status: Abnormal   Collection Time: 12/24/22  5:52 PM  Result Value Ref Range   Prothrombin Time 17.4 (H) 11.4 - 15.2 seconds   INR 1.4 (H) 0.8 - 1.2    Comment: (NOTE) INR goal varies based on device and disease states. Performed at [facility], [address], [place], [place] [address]   I-Stat Chem 8, ED     Status: Abnormal   Collection Time: 12/24/22  6:04 PM  Result Value Ref Range   Sodium 129 (L) 135 - 145 mmol/L   Potassium 4.6 3.5 - 5.1 mmol/L   Chloride 97 (L) 98 - 111 mmol/L   BUN 16 8 - 23 mg/dL   Creatinine, Ser 4.40 0.44 - 1.00 mg/dL   Glucose, Bld 347 (H) 70 - 99 mg/dL    Comment: Glucose reference range applies only to samples  taken after fasting for at least 8 hours.   Calcium, Ion 1.12 (L) 1.15 - 1.40 mmol/L    TCO2 23 22 - 32 mmol/L   Hemoglobin 16.0 (H) 12.0 - 15.0 g/dL   HCT 18.2 (H) 99.3 - 71.6 %   CT Thoracic Spine Wo Contrast  Result Date: 12/24/2022 CLINICAL DATA:  Spine fracture, thoracic, traumatic EXAM: CT THORACIC SPINE WITHOUT CONTRAST TECHNIQUE: Multidetector CT images of the thoracic were obtained using the standard protocol without intravenous contrast. RADIATION DOSE REDUCTION: This exam was performed according to the departmental dose-optimization program which includes automated exposure control, adjustment of the mA and/or kV according to patient size and/or use of iterative reconstruction technique. COMPARISON:  Chest CT reformats 11/13/2022 FINDINGS: Alignment: Exaggerated thoracic kyphosis. Retropulsion of T12 fracture is assessed better on concurrent lumbar spine, reported separately. Vertebrae: T12 compression fracture is better assessed on concurrent lumbar spine CT, reported separately. Acute nondisplaced fractures of right T8 and T9 transverse processes. No extension into the middle or anterior column. Chronic T5 hemangioma with inferior Schmorl's node. No suspicious bone lesion. Paraspinal and other soft tissues: Displaced posterior right ninth and tenth rib fractures. Trace right pleural effusion. Disc levels: Occasional disc space narrowing without high-grade canal stenosis. Bony retropulsion of T12 of 5 mm causes mass effect on the canal. IMPRESSION: 1. Acute nondisplaced fractures of right T8 and T9 transverse processes. 2. Displaced posterior right ninth and tenth rib fractures. 3. T12 fracture is better assessed on concurrent lumbar spine exam, stable from May. Electronically Signed   By: Narda Rutherford M.D.   On: 12/24/2022 18:43   CT Lumbar Spine Wo Contrast  Result Date: 12/24/2022 CLINICAL DATA:  Back trauma, no prior imaging (Age >= 16y) Fall. EXAM: CT LUMBAR SPINE WITHOUT CONTRAST TECHNIQUE: Multidetector CT imaging of the lumbar spine was performed without intravenous  contrast administration. Multiplanar CT image reconstructions were also generated. RADIATION DOSE REDUCTION: This exam was performed according to the departmental dose-optimization program which includes automated exposure control, adjustment of the mA and/or kV according to patient size and/or use of iterative reconstruction technique. COMPARISON:  Lumbar spine CT 09/05/2022, reformats from CT a 11/13/2022 FINDINGS: Segmentation: 5 lumbar type vertebrae. Alignment: T12 fracture has progressed from prior exam with bony retropulsion of 5 mm. Lumbar alignment is normal. Vertebrae: T12 compression fracture is progressed from March exam, but is stable from May. There is 65% loss of height centrally with bony retropulsion of 5 mm. No involvement of the posterior elements. No acute fracture of the lumbar spine. Unfused left L1 transverse process. The included sacrum is intact. Paraspinal and other soft tissues: Aortic atherosclerosis. No acute findings. Chronic subcapsular structure in the liver partially included, but grossly stable from prior imaging. Disc levels: Mild for age degenerative change without high-grade canal stenosis. IMPRESSION: 1. T12 compression fracture has progressed from March exam, but is stable from May. There is 65% loss of height centrally with bony retropulsion of 5 mm. 2. No acute fracture of the lumbar spine. Aortic Atherosclerosis (ICD10-I70.0). Electronically Signed   By: Narda Rutherford M.D.   On: 12/24/2022 18:37   CT Cervical Spine Wo Contrast  Result Date: 12/24/2022 CLINICAL DATA:  Neck trauma (Age >= 65y) EXAM: CT CERVICAL SPINE WITHOUT CONTRAST TECHNIQUE: Multidetector CT imaging of the cervical spine was performed without intravenous contrast. Multiplanar CT image reconstructions were also generated. RADIATION DOSE REDUCTION: This exam was performed according to the departmental dose-optimization program which includes automated exposure control, adjustment of the mA  and/or kV  according to patient size and/or use of iterative reconstruction technique. COMPARISON:  09/05/2022 FINDINGS: Alignment: Normal Skull base and vertebrae: No acute fracture. No primary bone lesion or focal pathologic process. Soft tissues and spinal canal: No prevertebral fluid or swelling. No visible canal hematoma. Disc levels:  Disc spaces maintained.  No disc herniation. Upper chest: No acute findings Other: None IMPRESSION: No acute bony abnormality. Electronically Signed   By: Charlett Nose M.D.   On: 12/24/2022 18:32   CT Head Wo Contrast  Result Date: 12/24/2022 CLINICAL DATA:  Head trauma, minor (Age >= 65y) EXAM: CT HEAD WITHOUT CONTRAST TECHNIQUE: Contiguous axial images were obtained from the base of the skull through the vertex without intravenous contrast. RADIATION DOSE REDUCTION: This exam was performed according to the departmental dose-optimization program which includes automated exposure control, adjustment of the mA and/or kV according to patient size and/or use of iterative reconstruction technique. COMPARISON:  09/05/2022 FINDINGS: Brain: There is atrophy and chronic small vessel disease changes. No acute intracranial abnormality. Specifically, no hemorrhage, hydrocephalus, mass lesion, acute infarction, or significant intracranial injury. Vascular: No hyperdense vessel or unexpected calcification. Skull: No acute calvarial abnormality. Sinuses/Orbits: No acute findings Other: None IMPRESSION: Atrophy, chronic microvascular disease. No acute intracranial abnormality. Electronically Signed   By: Charlett Nose M.D.   On: 12/24/2022 18:30   DG Chest Port 1 View  Result Date: 12/24/2022 CLINICAL DATA:  Fall, trauma EXAM: PORTABLE CHEST 1 VIEW COMPARISON:  01/19/2022 FINDINGS: Remote T12 compression fracture as shown on radiographs 11/13/2022. Atherosclerotic calcification of the aortic arch. Low lung volumes are present, causing crowding of the pulmonary vasculature. Moderate enlargement of  the cardiopericardial silhouette. Old healed right posterolateral rib fractures. In addition there is suspected discontinuity of the right lateral eighth rib most compatible with a displaced rib fracture. The lungs appear clear.  No blunting of the costophrenic angles. IMPRESSION: 1. Acute displaced right lateral eighth rib fracture. 2. Old healed right posterolateral fifth and sixth rib fractures. 3. Moderate enlargement of the cardiopericardial silhouette. 4. Low lung volumes. 5. Remote T12 compression fracture. Electronically Signed   By: Gaylyn Rong M.D.   On: 12/24/2022 18:00   DG Pelvis Portable  Result Date: 12/24/2022 CLINICAL DATA:  Trauma, fall EXAM: PORTABLE PELVIS 1-2 VIEWS COMPARISON:  None Available. FINDINGS: No displaced fracture or dislocation is seen. Joint spaces in both hips appear symmetrical. Few small smoothly marginated calcifications are noted adjacent to the left ischial suggesting possible calcific bursitis. IMPRESSION: No fracture or dislocation is seen in pelvis. Electronically Signed   By: Ernie Avena M.D.   On: 12/24/2022 17:54    Pending Labs Unresulted Labs (From admission, onward)     Start     Ordered   12/25/22 0500  CBC  Tomorrow morning,   R        12/24/22 2203   12/25/22 0500  Basic metabolic panel  Tomorrow morning,   R        12/24/22 2203            Vitals/Pain Today's Vitals   12/24/22 1900 12/24/22 2015 12/24/22 2045 12/24/22 2113  BP:  (!) 146/100 132/79   Pulse:  70 94   Resp:  (!) 32 (!) 28   Temp:    97.8 F (36.6 C)  TempSrc:    Oral  SpO2:  95% 92%   PainSc: 10-Worst pain ever       Isolation Precautions No active isolations  Medications Medications  acetaminophen (TYLENOL) tablet 1,000 mg (has no administration in time range)  oxyCODONE (Oxy IR/ROXICODONE) immediate release tablet 5-10 mg (has no administration in time range)  morphine (PF) 2 MG/ML injection 2 mg (has no administration in time range)   methocarbamol (ROBAXIN) tablet 500 mg (has no administration in time range)    Or  methocarbamol (ROBAXIN) 500 mg in dextrose 5 % 50 mL IVPB (has no administration in time range)  docusate sodium (COLACE) capsule 100 mg (has no administration in time range)  polyethylene glycol (MIRALAX / GLYCOLAX) packet 17 g (has no administration in time range)  ondansetron (ZOFRAN-ODT) disintegrating tablet 4 mg (has no administration in time range)    Or  ondansetron (ZOFRAN) injection 4 mg (has no administration in time range)  metoprolol tartrate (LOPRESSOR) injection 5 mg (has no administration in time range)  hydrALAZINE (APRESOLINE) injection 10 mg (has no administration in time range)  enoxaparin (LOVENOX) injection 30 mg (has no administration in time range)  ketorolac (TORADOL) 15 MG/ML injection 15 mg (has no administration in time range)  cefTRIAXone (ROCEPHIN) 1 g in sodium chloride 0.9 % 100 mL IVPB (has no administration in time range)  lidocaine (LIDODERM) 5 % 1 patch (has no administration in time range)  morphine (PF) 2 MG/ML injection 2 mg (2 mg Intravenous Given 12/24/22 1753)  morphine (PF) 2 MG/ML injection 2 mg (2 mg Intravenous Given 12/24/22 2045)    Mobility walks with device     Focused Assessments    R Recommendations: See Admitting Provider Note  Report given to:   Additional Notes: Patient is A&Ox4, admitted for pain control for rib fracture. Hx of lumbar compression fracture that is also causing her pain. UTI dx as well, abx ordered.

## 2022-12-24 NOTE — ED Notes (Signed)
Patient transported to CT by primary RN. 

## 2022-12-25 ENCOUNTER — Observation Stay (HOSPITAL_COMMUNITY): Payer: Medicare Other

## 2022-12-25 DIAGNOSIS — I4819 Other persistent atrial fibrillation: Secondary | ICD-10-CM | POA: Diagnosis not present

## 2022-12-25 DIAGNOSIS — J9811 Atelectasis: Secondary | ICD-10-CM | POA: Diagnosis not present

## 2022-12-25 DIAGNOSIS — I10 Essential (primary) hypertension: Secondary | ICD-10-CM | POA: Diagnosis not present

## 2022-12-25 DIAGNOSIS — S2241XA Multiple fractures of ribs, right side, initial encounter for closed fracture: Secondary | ICD-10-CM | POA: Diagnosis not present

## 2022-12-25 DIAGNOSIS — S22068A Other fracture of T7-T8 thoracic vertebra, initial encounter for closed fracture: Secondary | ICD-10-CM | POA: Diagnosis not present

## 2022-12-25 LAB — CBC
HCT: 41.7 % (ref 36.0–46.0)
Hemoglobin: 14.3 g/dL (ref 12.0–15.0)
MCH: 32.9 pg (ref 26.0–34.0)
MCHC: 34.3 g/dL (ref 30.0–36.0)
MCV: 96.1 fL (ref 80.0–100.0)
Platelets: 323 10*3/uL (ref 150–400)
RBC: 4.34 MIL/uL (ref 3.87–5.11)
RDW: 15 % (ref 11.5–15.5)
WBC: 10.6 10*3/uL — ABNORMAL HIGH (ref 4.0–10.5)
nRBC: 0 % (ref 0.0–0.2)

## 2022-12-25 LAB — BASIC METABOLIC PANEL
Anion gap: 9 (ref 5–15)
BUN: 9 mg/dL (ref 8–23)
CO2: 25 mmol/L (ref 22–32)
Calcium: 9.1 mg/dL (ref 8.9–10.3)
Chloride: 95 mmol/L — ABNORMAL LOW (ref 98–111)
Creatinine, Ser: 0.63 mg/dL (ref 0.44–1.00)
GFR, Estimated: >60 mL/min (ref >60)
Glucose, Bld: 134 mg/dL — ABNORMAL HIGH (ref 70–99)
Potassium: 4.7 mmol/L (ref 3.5–5.1)
Sodium: 129 mmol/L — ABNORMAL LOW (ref 135–145)

## 2022-12-25 MED ORDER — SPIRONOLACTONE 12.5 MG HALF TABLET
12.5000 mg | ORAL_TABLET | Freq: Every day | ORAL | Status: DC
  Administered 2022-12-25 – 2022-12-27 (×3): 12.5 mg via ORAL
  Filled 2022-12-25 (×5): qty 1

## 2022-12-25 MED ORDER — TORSEMIDE 20 MG PO TABS: 20.0000 mg | ORAL_TABLET | ORAL | Status: DC | PRN

## 2022-12-25 MED ORDER — TRAMADOL HCL 50 MG PO TABS
50.0000 mg | ORAL_TABLET | Freq: Four times a day (QID) | ORAL | Status: DC | PRN
  Administered 2022-12-25 – 2022-12-30 (×8): 50 mg via ORAL
  Filled 2022-12-25 (×9): qty 1

## 2022-12-25 MED ORDER — ALPRAZOLAM 0.25 MG PO TABS
0.2500 mg | ORAL_TABLET | Freq: Three times a day (TID) | ORAL | Status: DC | PRN
  Administered 2022-12-27 – 2022-12-28 (×2): 0.25 mg via ORAL
  Filled 2022-12-25 (×2): qty 1

## 2022-12-25 MED ORDER — DILTIAZEM HCL ER COATED BEADS 240 MG PO CP24
240.0000 mg | ORAL_CAPSULE | Freq: Every day | ORAL | Status: DC
  Administered 2022-12-25 – 2022-12-31 (×7): 240 mg via ORAL
  Filled 2022-12-25 (×8): qty 1

## 2022-12-25 MED ORDER — OXYCODONE HCL 5 MG PO TABS: 2.5000 mg | ORAL_TABLET | ORAL | Status: DC | PRN

## 2022-12-25 MED ORDER — CITALOPRAM HYDROBROMIDE 20 MG PO TABS
10.0000 mg | ORAL_TABLET | Freq: Every day | ORAL | Status: DC
  Administered 2022-12-25 – 2022-12-31 (×7): 10 mg via ORAL
  Filled 2022-12-25 (×7): qty 1

## 2022-12-25 MED ORDER — APIXABAN 5 MG PO TABS
5.0000 mg | ORAL_TABLET | Freq: Two times a day (BID) | ORAL | Status: DC
  Administered 2022-12-25 – 2022-12-31 (×13): 5 mg via ORAL
  Filled 2022-12-25 (×13): qty 1

## 2022-12-25 NOTE — Plan of Care (Signed)

## 2022-12-25 NOTE — Progress Notes (Signed)
Checked on patient this morning after being seen and admitted earlier this morning by Dr. Bedelia Person.  Has purewick in place.  Will remove this and order a bedside commode to encourage mobilization which is very important with her fracture.  D/w RN about getting the patient an IS for pulm toileting.  PT/OT ordered.  Will await their evals.  Patient states she can't imagine she is going to be able to return to independent living. Her daughter also works per the patient.  We discussed that she may require SNF placement then.  She was not opposed to that idea.  Will follow along for progress and therapy recommendations.  Whitney Burton 8:43 AM 12/25/2022

## 2022-12-25 NOTE — Evaluation (Addendum)
Physical Therapy Evaluation Patient Details Name: Whitney Burton MRN: [id number] DOB: [birth year] Today's Date: 12/25/2022  History of Present Illness  74F s/p mechanical GLF 12/24/22, reports losing her balance. Lives at an independent living facility. Reports landing on her back. PMH-a-fib, anxiety, breast cancer, HTN,  Clinical Impression   Pt admitted secondary to problem above with deficits below. PTA patient was living in an Independent Living apartment alone. She has an aide that comes twice per week for a few hours, primarily to take her to MD appointments. She reports she did not have her rollator with her when she fell and that all her falls have been sudden posterior falls.  Pt currently requires min assist for OOB and ambulated 5 ft (limited by 9/10 back pain). Anticipate patient will benefit from PT to address problems listed below.Will continue to follow acutely to maximize functional mobility independence and safety.           Assistance Recommended at Discharge Frequent or constant Supervision/Assistance  If plan is discharge home, recommend the following:  Can travel by private vehicle  A little help with walking and/or transfers;A little help with bathing/dressing/bathroom;Assistance with cooking/housework;Assist for transportation   Yes    Equipment Recommendations None recommended by PT  Recommendations for Other Services  OT consult    Functional Status Assessment Patient has had a recent decline in their functional status and demonstrates the ability to make significant improvements in function in a reasonable and predictable amount of time.     Precautions / Restrictions Precautions Precautions: Fall Precaution Comments: states she always goes backwards without warning      Mobility  Bed Mobility Overal bed mobility: Needs Assistance Bed Mobility: Rolling, Sidelying to Sit Rolling: Min assist (with rail) Sidelying to sit: Min assist       General bed  mobility comments: vc for technique (rolling to her left to minimize pain; +use of rail    Transfers Overall transfer level: Needs assistance Equipment used: Rolling walker (2 wheels) Transfers: Sit to/from Stand Sit to Stand: Min guard           General transfer comment: vc for technique/hand placement    Ambulation/Gait Ambulation/Gait assistance: Min guard Gait Distance (Feet): 5 Feet Assistive device: Rolling walker (2 wheels) Gait Pattern/deviations: Step-through pattern, Decreased stride length       General Gait Details: limited herself to walk to the chair due to 9/10 pain  Stairs            Wheelchair Mobility     Tilt Bed    Modified Rankin (Stroke Patients Only)       Balance Overall balance assessment: History of Falls                                           Pertinent Vitals/Pain Pain Assessment Pain Assessment: 0-10 Pain Score: 9  Pain Location: Rt of spine and central spine Pain Descriptors / Indicators: Grimacing, Guarding, Moaning Pain Intervention(s): Limited activity within patient's tolerance, Monitored during session, RN gave pain meds during session, Repositioned    Home Living Family/patient expects to be discharged to:: Private residence Living Arrangements: Alone Available Help at Discharge: Personal care attendant (2 days a week, 2 hrs day) Type of Home: Independent living facility Home Access: Ramped entrance       Home Layout: One level Home Equipment: Tub bench;Rollator (4 wheels);Wheelchair - manual;BSC/3in1;Hand  held shower head;Grab bars - tub/shower;Grab bars - toilet;Rolling Walker (2 wheels)      Prior Function Prior Level of Function : Independent/Modified Independent             Mobility Comments: uses rollator in the apartment and RW when she goes out to appts ADLs Comments: aide drives her to her MD appointments     Hand Dominance   Dominant Hand: Right    Extremity/Trunk  Assessment   Upper Extremity Assessment Upper Extremity Assessment: Defer to OT evaluation    Lower Extremity Assessment Lower Extremity Assessment: Generalized weakness    Cervical / Trunk Assessment Cervical / Trunk Assessment: Kyphotic  Communication   Communication: No difficulties  Cognition Arousal/Alertness: Awake/alert Behavior During Therapy: WFL for tasks assessed/performed Overall Cognitive Status: Within Functional Limits for tasks assessed                                          General Comments      Exercises     Assessment/Plan    PT Assessment Patient needs continued PT services  PT Problem List Decreased strength;Decreased activity tolerance;Decreased balance;Decreased mobility;Decreased knowledge of use of DME;Decreased safety awareness;Pain       PT Treatment Interventions DME instruction;Gait training;Functional mobility training;Therapeutic activities;Therapeutic exercise;Balance training;Cognitive remediation;Patient/family education    PT Goals (Current goals can be found in the Care Plan section)  Acute Rehab PT Goals Patient Stated Goal: ultimately to return home PT Goal Formulation: With patient Time For Goal Achievement: 01/08/23 Potential to Achieve Goals: Good    Frequency Min 3X/week (?flip)     Co-evaluation               AM-PAC PT "6 Clicks" Mobility  Outcome Measure Help needed turning from your back to your side while in a flat bed without using bedrails?: A Little Help needed moving from lying on your back to sitting on the side of a flat bed without using bedrails?: A Little Help needed moving to and from a bed to a chair (including a wheelchair)?: A Little Help needed standing up from a chair using your arms (e.g., wheelchair or bedside chair)?: A Little Help needed to walk in hospital room?: A Little Help needed climbing 3-5 steps with a railing? : Total 6 Click Score: 16    End of Session  Equipment Utilized During Treatment: Gait belt Activity Tolerance: Patient limited by pain Patient left: in chair;with call bell/phone within reach;Other (comment) (with OT; chair alarm in place but not set) Nurse Communication: Mobility status PT Visit Diagnosis: Unsteadiness on feet (R26.81);History of falling (Z91.81);Pain Pain - Right/Left: Right Pain - part of body:  (flank/back)    Time: 1359-1430 PT Time Calculation (min) (ACUTE ONLY): 31 min   Charges:   PT Evaluation $PT Eval Low Complexity: 1 Low PT Treatments $Therapeutic Activity: 8-22 mins PT General Charges $$ ACUTE PT VISIT: 1 Visit          Jerolyn Center, PT Acute Rehabilitation Services  Office [phone]   Zena Amos 12/25/2022, 2:52 PM

## 2022-12-25 NOTE — Evaluation (Signed)
Occupational Therapy Evaluation Patient Details Name: Whitney Burton MRN: [id number] DOB: 1932-11-26 Today's Date: 12/25/2022   History of Present Illness 42F s/p mechanical GLF 12/24/22, reports losing her balance. Lives at an independent living facility. Reports landing on her back. PMH-a-fib, anxiety, breast cancer, HTN,   Clinical Impression   Pt s/p above diagnosis. Pt doing well, displays good strength and activity tolerance, limited with RUE overhead/reaching activities due to pain at R back ribs, 5/10. Pt PLOF lives in independent living, mod I for ADLs/mobility. Pt states she feels like she loses her balance and just falls backwards unable to recover, and is anxious about falling again. Pt instructed on AD to assist LB dressing as reaching BLEs is difficult due to pain. Pt would benefit greatly from post acute rehab <3hrs/day to improve balance and functional strength prior to return home, would benefit from continued acute therapy to maximize progress as able.     Recommendations for follow up therapy are one component of a multi-disciplinary discharge planning process, led by the attending physician.  Recommendations may be updated based on patient status, additional functional criteria and insurance authorization.   Assistance Recommended at Discharge Frequent or constant Supervision/Assistance  Patient can return home with the following A little help with walking and/or transfers;A little help with bathing/dressing/bathroom;Assistance with cooking/housework;Assist for transportation;Help with stairs or ramp for entrance    Functional Status Assessment  Patient has had a recent decline in their functional status and demonstrates the ability to make significant improvements in function in a reasonable and predictable amount of time.  Equipment Recommendations  None recommended by OT    Recommendations for Other Services       Precautions / Restrictions Precautions Precautions:  Fall Precaution Comments: states she always goes backwards without warning Restrictions Weight Bearing Restrictions: No      Mobility Bed Mobility Overal bed mobility: Needs Assistance             General bed mobility comments: arrived in chair    Transfers Overall transfer level: Needs assistance Equipment used: Rolling walker (2 wheels) Transfers: Sit to/from Stand Sit to Stand: Min guard           General transfer comment: good safety awareness, no LOB      Balance Overall balance assessment: History of Falls                                         ADL either performed or assessed with clinical judgement   ADL Overall ADL's : Needs assistance/impaired Eating/Feeding: Independent   Grooming: Supervision/safety;Standing   Upper Body Bathing: Set up;Sitting   Lower Body Bathing: Moderate assistance;Sitting/lateral leans   Upper Body Dressing : Set up;Sitting   Lower Body Dressing: Minimal assistance;Sit to/from stand;With adaptive equipment   Toilet Transfer: Min guard   Toileting- Clothing Manipulation and Hygiene: Modified independent       Functional mobility during ADLs: Min guard General ADL Comments: Pt overall good strength/mobility, anxious about falling backwards again with ambulation, assistance for LB ADLs due to reaching/bending hurts R back ribs. Pt instructed on use of sock aide/reacher and able to perform with set up     Vision Baseline Vision/History: 1 Wears glasses Ability to See in Adequate Light: 0 Adequate Patient Visual Report: No change from baseline       Perception     Praxis  Pertinent Vitals/Pain Pain Assessment Pain Assessment: 0-10 Pain Score: 5  Faces Pain Scale: Hurts little more Pain Location: Rt of spine and central spine Pain Descriptors / Indicators: Grimacing, Guarding, Moaning Pain Intervention(s): Monitored during session     Hand Dominance Right   Extremity/Trunk Assessment  Upper Extremity Assessment Upper Extremity Assessment: Overall WFL for tasks assessed;RUE deficits/detail RUE Deficits / Details: some pain with overhead/reaching activities with RUE, but is able to complete functional activities RUE Sensation: WNL RUE Coordination: decreased gross motor   Lower Extremity Assessment Lower Extremity Assessment: Defer to PT evaluation   Cervical / Trunk Assessment Cervical / Trunk Assessment: Kyphotic   Communication Communication Communication: No difficulties   Cognition Arousal/Alertness: Awake/alert Behavior During Therapy: WFL for tasks assessed/performed Overall Cognitive Status: Within Functional Limits for tasks assessed                                       General Comments       Exercises     Shoulder Instructions      Home Living Family/patient expects to be discharged to:: Private residence Living Arrangements: Alone Available Help at Discharge: Personal care attendant Type of Home: Independent living facility Home Access: Ramped entrance     Home Layout: One level     Bathroom Shower/Tub: Producer, television/film/video: Handicapped height Bathroom Accessibility: Yes   Home Equipment: Tub bench;Rollator (4 wheels);Wheelchair - manual;BSC/3in1;Hand held shower head;Grab bars - tub/shower;Grab bars - toilet;Rolling Walker (2 wheels)          Prior Functioning/Environment Prior Level of Function : Independent/Modified Independent             Mobility Comments: uses rollator in the apartment and RW when she goes out to appts ADLs Comments: aide drives her to her MD appointments        OT Problem List: Decreased strength;Decreased range of motion;Decreased activity tolerance;Impaired balance (sitting and/or standing);Impaired UE functional use;Pain      OT Treatment/Interventions: Self-care/ADL training;Therapeutic exercise;Neuromuscular education;DME and/or AE instruction;Therapeutic activities     OT Goals(Current goals can be found in the care plan section) Acute Rehab OT Goals Patient Stated Goal: to improve balance prior to return home OT Goal Formulation: With patient Time For Goal Achievement: 01/08/23 Potential to Achieve Goals: Good  OT Frequency: Min 2X/week    Co-evaluation              AM-PAC OT "6 Clicks" Daily Activity     Outcome Measure Help from another person eating meals?: None Help from another person taking care of personal grooming?: A Little Help from another person toileting, which includes using toliet, bedpan, or urinal?: A Little Help from another person bathing (including washing, rinsing, drying)?: A Lot Help from another person to put on and taking off regular upper body clothing?: A Little Help from another person to put on and taking off regular lower body clothing?: A Little 6 Click Score: 18   End of Session Equipment Utilized During Treatment: Gait belt;Rolling walker (2 wheels) Nurse Communication: Mobility status  Activity Tolerance: Patient tolerated treatment well Patient left: in chair;with call bell/phone within reach  OT Visit Diagnosis: Unsteadiness on feet (R26.81);Other abnormalities of gait and mobility (R26.89);Repeated falls (R29.6);Muscle weakness (generalized) (M62.81);Pain Pain - Right/Left: Right Pain - part of body:  (back/ribs)  Time: 4098-1191 OT Time Calculation (min): 27 min Charges:  OT General Charges $OT Visit: 1 Visit OT Evaluation $OT Eval Low Complexity: 1 Low OT Treatments $Self Care/Home Management : 8-22 mins  [address], OTR/L   Alexis Goodell 12/25/2022, 3:04 PM

## 2022-12-25 NOTE — TOC CAGE-AID Note (Signed)
Transition of Care [facility]) - CAGE-AID Screening   Patient Details  Name: Whitney Burton MRN: [id number] Date of Birth: 1933-01-04  Transition of Care (TOC) CM/SW Contact:    Katha Hamming, RN Phone Number: 12/25/2022, 6:18 AM    CAGE-AID Screening:    Have You Ever Felt You Ought to Cut Down on Your Drinking or Drug Use?: No Have People Annoyed You By [employer] Your Drinking Or Drug Use?: No Have You Felt Bad Or Guilty About Your Drinking Or Drug Use?: No Have You Ever Had a Drink or Used Drugs First Thing In The Morning to Steady Your Nerves or to Get Rid of a Hangover?: No CAGE-AID Score: 0  Substance Abuse Education Offered: No

## 2022-12-26 NOTE — Progress Notes (Signed)
Physical Therapy Treatment Patient Details Name: Whitney Burton MRN: [id number] DOB: January 28, 1933 Today's Date: 12/26/2022   History of Present Illness 52F s/p mechanical GLF 12/24/22, reports losing her balance. Lives at an independent living facility. Reports landing on her back. PMH-a-fib, anxiety, breast cancer, HTN,    PT Comments  Pt tolerated treatment well today. Pt was able to progress ambulation in the hallway today with RW Min G. Pt moves fairly well once upright however expresses fear of returning home alone currently given her history of falls. No change in DC/DME recs at this time. PT will continue to follow.     Assistance Recommended at Discharge Frequent or constant Supervision/Assistance  If plan is discharge home, recommend the following:  Can travel by private vehicle    A little help with walking and/or transfers;A little help with bathing/dressing/bathroom;Assistance with cooking/housework;Assist for transportation   Yes  Equipment Recommendations  None recommended by PT    Recommendations for Other Services       Precautions / Restrictions Precautions Precautions: Fall Precaution Comments: states she always goes backwards without warning Restrictions Weight Bearing Restrictions: No     Mobility  Bed Mobility               General bed mobility comments: arrived in chair    Transfers Overall transfer level: Needs assistance Equipment used: Rolling walker (2 wheels) Transfers: Sit to/from Stand Sit to Stand: Min guard           General transfer comment: good safety awareness, no LOB    Ambulation/Gait Ambulation/Gait assistance: Min guard Gait Distance (Feet): 140 Feet Assistive device: Rolling walker (2 wheels) Gait Pattern/deviations: Step-through pattern, Decreased stride length Gait velocity: reduced     General Gait Details: no LOB noted. Pt moves very well once upright.   Stairs             Wheelchair Mobility     Tilt  Bed    Modified Rankin (Stroke Patients Only)       Balance Overall balance assessment: History of Falls                                          Cognition Arousal/Alertness: Awake/alert Behavior During Therapy: WFL for tasks assessed/performed Overall Cognitive Status: Within Functional Limits for tasks assessed                                          Exercises      General Comments General comments (skin integrity, edema, etc.): VSS on RA      Pertinent Vitals/Pain Pain Assessment Pain Assessment: Faces Faces Pain Scale: Hurts little more Pain Location: Ribs and spine Pain Descriptors / Indicators: Grimacing, Guarding, Moaning Pain Intervention(s): Monitored during session, Limited activity within patient's tolerance, Premedicated before session, Repositioned    Home Living                          Prior Function            PT Goals (current goals can now be found in the care plan section) Progress towards PT goals: Progressing toward goals    Frequency    Min 3X/week      PT Plan Current plan remains appropriate  Co-evaluation              AM-PAC PT "6 Clicks" Mobility   Outcome Measure  Help needed turning from your back to your side while in a flat bed without using bedrails?: A Little Help needed moving from lying on your back to sitting on the side of a flat bed without using bedrails?: A Little Help needed moving to and from a bed to a chair (including a wheelchair)?: A Little Help needed standing up from a chair using your arms (e.g., wheelchair or bedside chair)?: A Little Help needed to walk in hospital room?: A Little Help needed climbing 3-5 steps with a railing? : Total 6 Click Score: 16    End of Session Equipment Utilized During Treatment: Gait belt Activity Tolerance: Patient tolerated treatment well Patient left: in chair Nurse Communication: Mobility status PT Visit  Diagnosis: Unsteadiness on feet (R26.81);History of falling (Z91.81);Pain Pain - Right/Left: Right Pain - part of body:  (flank and back)     Time: 1101-1116 PT Time Calculation (min) (ACUTE ONLY): 15 min  Charges:    $Gait Training: 8-22 mins PT General Charges $$ ACUTE PT VISIT: 1 Visit                     [name], PT, DPT Acute Rehab Services [phone]    [name] 12/26/2022, 12:32 PM

## 2022-12-26 NOTE — Plan of Care (Signed)

## 2022-12-26 NOTE — NC FL2 (Signed)
[place] MEDICAID FL2 LEVEL OF CARE FORM     IDENTIFICATION  Patient Name: Whitney Burton Birthdate: 10/02/1932 Sex: female Admission Date (Current Location): 12/24/2022  [facility] and [place] Number:  Producer, television/film/video and Address:  [facility], [address] [place], [place] [address]      Provider Number: [id number]  Attending Physician Name and Address:  Md, Trauma, MD  Relative Name and Phone Number:       Current Level of Care: Hospital Recommended Level of Care: Skilled Nursing Facility Prior Approval Number:    Date Approved/Denied:   PASRR Number: [id number]  Discharge Plan: SNF    Current Diagnoses: Patient Active Problem List   Diagnosis Date Noted   Rib fractures 12/24/2022   Pulmonary nodules 08/14/2022   Open angle with borderline findings and high glaucoma risk in both eyes 06/26/2010   Nuclear senile cataract 06/26/2010   Visual disturbance 06/26/2010    Orientation RESPIRATION BLADDER Height & Weight     Self, Time, Situation, Place  Normal Continent Weight:   Height:     BEHAVIORAL SYMPTOMS/MOOD NEUROLOGICAL BOWEL NUTRITION STATUS      Continent    AMBULATORY STATUS COMMUNICATION OF NEEDS Skin   Limited Assist Verbally Normal                       Personal Care Assistance Level of Assistance  Bathing, Feeding, Dressing Bathing Assistance: Limited assistance Feeding assistance: Limited assistance Dressing Assistance: Limited assistance Total Care Assistance: Limited assistance   Functional Limitations Info  Sight, Hearing, Speech Sight Info: Adequate Hearing Info: Adequate Speech Info: Adequate    SPECIAL CARE FACTORS FREQUENCY  PT (By licensed PT), OT (By licensed OT)                    Contractures Contractures Info: Not present    Additional Factors Info                  Current Medications (12/26/2022):  This is the current hospital active medication list Current  Facility-Administered Medications  Medication Dose Route Frequency Provider Last Rate Last Admin   acetaminophen (TYLENOL) tablet 1,000 mg  1,000 mg Oral Q6H Lovick, Lennie Odor, MD   1,000 mg at 12/26/22 6962   ALPRAZolam (XANAX) tablet 0.25 mg  0.25 mg Oral TID PRN Barnetta Chapel, PA-C       apixaban Everlene Balls) tablet 5 mg  5 mg Oral BID Barnetta Chapel, PA-C   5 mg at 12/26/22 9528   citalopram (CELEXA) tablet 10 mg  10 mg Oral Daily Barnetta Chapel, PA-C   10 mg at 12/26/22 4132   diltiazem (CARDIZEM CD) 24 hr capsule 240 mg  240 mg Oral Daily Barnetta Chapel, PA-C   240 mg at 12/26/22 4401   docusate sodium (COLACE) capsule 100 mg  100 mg Oral BID Diamantina Monks, MD   100 mg at 12/26/22 0272   hydrALAZINE (APRESOLINE) injection 10 mg  10 mg Intravenous Q2H PRN Diamantina Monks, MD       ketorolac (TORADOL) 15 MG/ML injection 15 mg  15 mg Intravenous Q6H Diamantina Monks, MD   15 mg at 12/26/22 0627   lidocaine (LIDODERM) 5 % 1 patch  1 patch Transdermal Q24H Diamantina Monks, MD   1 patch at 12/25/22 2220   methocarbamol (ROBAXIN) tablet 500 mg  500 mg Oral Q8H Lovick, Lennie Odor, MD   500 mg at  12/26/22 9604   Or   methocarbamol (ROBAXIN) 500 mg in dextrose 5 % 50 mL IVPB  500 mg Intravenous Q8H Diamantina Monks, MD   Stopped at 12/25/22 0000   metoprolol tartrate (LOPRESSOR) injection 5 mg  5 mg Intravenous Q6H PRN Diamantina Monks, MD       morphine (PF) 2 MG/ML injection 2 mg  2 mg Intravenous Q4H PRN Diamantina Monks, MD       ondansetron (ZOFRAN-ODT) disintegrating tablet 4 mg  4 mg Oral Q6H PRN Diamantina Monks, MD       Or   ondansetron (ZOFRAN) injection 4 mg  4 mg Intravenous Q6H PRN Diamantina Monks, MD   4 mg at 12/24/22 2330   polyethylene glycol (MIRALAX / GLYCOLAX) packet 17 g  17 g Oral Daily PRN Diamantina Monks, MD       spironolactone (ALDACTONE) tablet 12.5 mg  12.5 mg Oral Daily Barnetta Chapel, PA-C   12.5 mg at 12/26/22 0829   torsemide (DEMADEX) tablet 20 mg  20 mg Oral PRN  Barnetta Chapel, PA-C       traMADol Janean Sark) tablet 50 mg  50 mg Oral Q6H PRN Barnetta Chapel, PA-C   50 mg at 12/26/22 5409     Discharge Medications: Please see discharge summary for a list of discharge medications.  Relevant Imaging Results:  Relevant Lab Results:   Additional Information SS# [id number]  Deatra Robinson, [place]

## 2022-12-26 NOTE — Progress Notes (Signed)
Subjective: Continues to complain of pain and not being able to do for herself which is different than when she had her compression fracture of which she was able to do more.  Did get up to the chair for 1.5 hrs yesterday  ROS: See above, otherwise other systems negative  Objective: Vital signs in last 24 hours: Temp:  [97.5 F (36.4 C)-97.7 F (36.5 C)] 97.5 F (36.4 C) (07/12 0537) Pulse Rate:  [56-79] 72 (07/12 0537) Resp:  [16] 16 (07/12 0537) BP: (126-151)/(65-86) 145/86 (07/12 0537) SpO2:  [94 %-97 %] 96 % (07/12 0537) Last BM Date : 12/24/22  Intake/Output from previous day: 07/11 0701 - 07/12 0700 In: 55 [IV Piggyback:55] Out: 300 [Urine:300] Intake/Output this shift: No intake/output data recorded.  PE: Gen: NAD laying in bed Heart: irregular, but rate controlled Lungs: CTAB, chest wall tenderness on right side as expected Abd: NT Ext: MAE Psych: A&Ox3  Lab Results:  Recent Labs    12/24/22 1752 12/24/22 1804 12/25/22 0255  WBC 9.7  --  10.6*  HGB 15.1* 16.0* 14.3  HCT 43.6 47.0* 41.7  PLT 331  --  323   BMET Recent Labs    12/24/22 1752 12/24/22 1804 12/25/22 0255  NA 129* 129* 129*  K 4.8 4.6 4.7  CL 95* 97* 95*  CO2 23  --  25  GLUCOSE 122* 119* 134*  BUN 15 16 9   CREATININE 0.72 0.70 0.63  CALCIUM 9.5  --  9.1   PT/INR Recent Labs    12/24/22 1752  LABPROT 17.4*  INR 1.4*   CMP     Component Value Date/Time   NA 129 (L) 12/25/2022 0255   NA 137 02/15/2021 0852   K 4.7 12/25/2022 0255   CL 95 (L) 12/25/2022 0255   CO2 25 12/25/2022 0255   GLUCOSE 134 (H) 12/25/2022 0255   BUN 9 12/25/2022 0255   BUN 17 02/15/2021 0852   CREATININE 0.63 12/25/2022 0255   CALCIUM 9.1 12/25/2022 0255   PROT 6.4 (L) 12/24/2022 1752   ALBUMIN 3.8 12/24/2022 1752   AST 30 12/24/2022 1752   ALT 16 12/24/2022 1752   ALKPHOS 74 12/24/2022 1752   BILITOT 0.8 12/24/2022 1752   GFRNONAA >60 12/25/2022 0255   Lipase  No results found for:  "LIPASE"     Studies/Results: DG Chest Port 1 View  Result Date: 12/25/2022 CLINICAL DATA:  [id number] Rib fractures [id number] EXAM: PORTABLE CHEST 1 VIEW COMPARISON:  Chest radiograph from 12/24/2022. FINDINGS: There are patchy atelectatic changes at the lung bases. No mass, consolidation or large pleural effusion. No pneumothorax. Stable cardio-mediastinal silhouette. Redemonstration of mildly displaced fracture of the lateral aspect of right eighth rib, better seen on the prior exam. There are old healed fractures of the posterolateral aspect of right fifth and sixth ribs. Previously described compression deformity of T12 vertebra is not well visualized on this exam. The soft tissues are within normal limits. Multiple metallic surgical staples noted overlying the right upper abdomen. IMPRESSION: 1. Patchy bibasilar atelectasis. 2. Redemonstration of mildly displaced fracture of the lateral aspect of right eighth rib. Electronically Signed   By: Jules Schick M.D.   On: 12/25/2022 08:38   CT Thoracic Spine Wo Contrast  Result Date: 12/24/2022 CLINICAL DATA:  Spine fracture, thoracic, traumatic EXAM: CT THORACIC SPINE WITHOUT CONTRAST TECHNIQUE: Multidetector CT images of the thoracic were obtained using the standard protocol without intravenous contrast. RADIATION DOSE REDUCTION: This exam was  performed according to the departmental dose-optimization program which includes automated exposure control, adjustment of the mA and/or kV according to patient size and/or use of iterative reconstruction technique. COMPARISON:  Chest CT reformats 11/13/2022 FINDINGS: Alignment: Exaggerated thoracic kyphosis. Retropulsion of T12 fracture is assessed better on concurrent lumbar spine, reported separately. Vertebrae: T12 compression fracture is better assessed on concurrent lumbar spine CT, reported separately. Acute nondisplaced fractures of right T8 and T9 transverse processes. No extension into the middle or anterior  column. Chronic T5 hemangioma with inferior Schmorl's node. No suspicious bone lesion. Paraspinal and other soft tissues: Displaced posterior right ninth and tenth rib fractures. Trace right pleural effusion. Disc levels: Occasional disc space narrowing without high-grade canal stenosis. Bony retropulsion of T12 of 5 mm causes mass effect on the canal. IMPRESSION: 1. Acute nondisplaced fractures of right T8 and T9 transverse processes. 2. Displaced posterior right ninth and tenth rib fractures. 3. T12 fracture is better assessed on concurrent lumbar spine exam, stable from May. Electronically Signed   By: Narda Rutherford M.D.   On: 12/24/2022 18:43   CT Lumbar Spine Wo Contrast  Result Date: 12/24/2022 CLINICAL DATA:  Back trauma, no prior imaging (Age >= 16y) Fall. EXAM: CT LUMBAR SPINE WITHOUT CONTRAST TECHNIQUE: Multidetector CT imaging of the lumbar spine was performed without intravenous contrast administration. Multiplanar CT image reconstructions were also generated. RADIATION DOSE REDUCTION: This exam was performed according to the departmental dose-optimization program which includes automated exposure control, adjustment of the mA and/or kV according to patient size and/or use of iterative reconstruction technique. COMPARISON:  Lumbar spine CT 09/05/2022, reformats from CT a 11/13/2022 FINDINGS: Segmentation: 5 lumbar type vertebrae. Alignment: T12 fracture has progressed from prior exam with bony retropulsion of 5 mm. Lumbar alignment is normal. Vertebrae: T12 compression fracture is progressed from March exam, but is stable from May. There is 65% loss of height centrally with bony retropulsion of 5 mm. No involvement of the posterior elements. No acute fracture of the lumbar spine. Unfused left L1 transverse process. The included sacrum is intact. Paraspinal and other soft tissues: Aortic atherosclerosis. No acute findings. Chronic subcapsular structure in the liver partially included, but grossly  stable from prior imaging. Disc levels: Mild for age degenerative change without high-grade canal stenosis. IMPRESSION: 1. T12 compression fracture has progressed from March exam, but is stable from May. There is 65% loss of height centrally with bony retropulsion of 5 mm. 2. No acute fracture of the lumbar spine. Aortic Atherosclerosis (ICD10-I70.0). Electronically Signed   By: Narda Rutherford M.D.   On: 12/24/2022 18:37   CT Cervical Spine Wo Contrast  Result Date: 12/24/2022 CLINICAL DATA:  Neck trauma (Age >= 65y) EXAM: CT CERVICAL SPINE WITHOUT CONTRAST TECHNIQUE: Multidetector CT imaging of the cervical spine was performed without intravenous contrast. Multiplanar CT image reconstructions were also generated. RADIATION DOSE REDUCTION: This exam was performed according to the departmental dose-optimization program which includes automated exposure control, adjustment of the mA and/or kV according to patient size and/or use of iterative reconstruction technique. COMPARISON:  09/05/2022 FINDINGS: Alignment: Normal Skull base and vertebrae: No acute fracture. No primary bone lesion or focal pathologic process. Soft tissues and spinal canal: No prevertebral fluid or swelling. No visible canal hematoma. Disc levels:  Disc spaces maintained.  No disc herniation. Upper chest: No acute findings Other: None IMPRESSION: No acute bony abnormality. Electronically Signed   By: Charlett Nose M.D.   On: 12/24/2022 18:32   CT Head Wo Contrast  Result Date: 12/24/2022 CLINICAL DATA:  Head trauma, minor (Age >= 65y) EXAM: CT HEAD WITHOUT CONTRAST TECHNIQUE: Contiguous axial images were obtained from the base of the skull through the vertex without intravenous contrast. RADIATION DOSE REDUCTION: This exam was performed according to the departmental dose-optimization program which includes automated exposure control, adjustment of the mA and/or kV according to patient size and/or use of iterative reconstruction technique.  COMPARISON:  09/05/2022 FINDINGS: Brain: There is atrophy and chronic small vessel disease changes. No acute intracranial abnormality. Specifically, no hemorrhage, hydrocephalus, mass lesion, acute infarction, or significant intracranial injury. Vascular: No hyperdense vessel or unexpected calcification. Skull: No acute calvarial abnormality. Sinuses/Orbits: No acute findings Other: None IMPRESSION: Atrophy, chronic microvascular disease. No acute intracranial abnormality. Electronically Signed   By: Charlett Nose M.D.   On: 12/24/2022 18:30   DG Chest Port 1 View  Result Date: 12/24/2022 CLINICAL DATA:  Fall, trauma EXAM: PORTABLE CHEST 1 VIEW COMPARISON:  01/19/2022 FINDINGS: Remote T12 compression fracture as shown on radiographs 11/13/2022. Atherosclerotic calcification of the aortic arch. Low lung volumes are present, causing crowding of the pulmonary vasculature. Moderate enlargement of the cardiopericardial silhouette. Old healed right posterolateral rib fractures. In addition there is suspected discontinuity of the right lateral eighth rib most compatible with a displaced rib fracture. The lungs appear clear.  No blunting of the costophrenic angles. IMPRESSION: 1. Acute displaced right lateral eighth rib fracture. 2. Old healed right posterolateral fifth and sixth rib fractures. 3. Moderate enlargement of the cardiopericardial silhouette. 4. Low lung volumes. 5. Remote T12 compression fracture. Electronically Signed   By: Gaylyn Rong M.D.   On: 12/24/2022 18:00   DG Pelvis Portable  Result Date: 12/24/2022 CLINICAL DATA:  Trauma, fall EXAM: PORTABLE PELVIS 1-2 VIEWS COMPARISON:  None Available. FINDINGS: No displaced fracture or dislocation is seen. Joint spaces in both hips appear symmetrical. Few small smoothly marginated calcifications are noted adjacent to the left ischial suggesting possible calcific bursitis. IMPRESSION: No fracture or dislocation is seen in pelvis. Electronically Signed    By: Ernie Avena M.D.   On: 12/24/2022 17:54    Anti-infectives: Anti-infectives (From admission, onward)    Start     Dose/Rate Route Frequency Ordered Stop   12/24/22 2215  cefTRIAXone (ROCEPHIN) 1 g in sodium chloride 0.9 % 100 mL IVPB        1 g 200 mL/hr over 30 Minutes Intravenous  Once 12/24/22 2204 12/24/22 2309        Assessment/Plan Mechanical GLD R Rib fxs 9,10 - pain control, IS, pulls 750 at best, mobilization, encouraged patient to take her tramadol today to help with pain control. R t8, T9 TVP Fxs - pain control A fib - eliquis, diltiazem HTN - home meds resumed Anxiety FEN - regular diet, SLIV VTE - eliquis ID - none needed Dispo - SNF, will have TOC start search as patient is medically stable for DC when bed available  I reviewed nursing notes, last 24 h vitals and pain scores, last 48 h intake and output, last 24 h labs and trends, and last 24 h imaging results.   LOS: 0 days    Letha Cape , [facility] 12/26/2022, 8:13 AM Please see Amion for pager number during day hours 7:00am-4:30pm or 7:00am -11:30am on weekends

## 2022-12-26 NOTE — TOC Initial Note (Signed)
Transition of Care [facility]) - Initial/Assessment Note    Patient Details  Name: Whitney Burton MRN: [id number] Date of Birth: 12-Jul-1932  [facility] CM/SW Contact:    Deatra Robinson, LCSW Phone Number: 12/26/2022, 11:31 AM  Clinical Narrative:  Pt admitted from [place] at [facility]. Spoke to pt and pt's dtr Alvino Chapel re PT recommendation for SNF and both are agreeable. SNF search started and SW provided current bed offers to pt's dtr Alvino Chapel who  accepted [facility]. Confirmed with [place] at [place] they are prepared to admit pt beginning Monday 7/15. Navi/[employer] auth pending. Trauma APP updated.   Dellie Burns, MSW, LCSW [phone] (coverage)     Expected Discharge Plan: Skilled Nursing Facility Barriers to Discharge: Insurance Authorization, No SNF bed   Patient Goals and CMS Choice   CMS Medicare.gov Compare Post Acute Care list provided to:: Patient Choice offered to / list presented to : Patient, Adult Children [place] ownership interest in [facility].provided to:: Patient    Expected Discharge Plan and Services     Post Acute Care Choice: Skilled Nursing Facility Living arrangements for the past 2 months: Independent Living Facility                                      Prior Living Arrangements/Services Living arrangements for the past 2 months: Independent Living Facility Lives with:: Self   Do you feel safe going back to the place where you live?: Yes      Need for Family Participation in Patient Care: Yes (Comment) Care giver support system in place?: Yes (comment)   Criminal Activity/Legal Involvement Pertinent to Current Situation/Hospitalization: No - Comment as needed  Activities of Daily Living Home Assistive Devices/Equipment: Walker (specify type) ADL Screening (condition at time of admission) Patient's cognitive ability adequate to safely complete daily activities?: Yes Is the patient deaf or have difficulty  hearing?: No Does the patient have difficulty seeing, even when wearing glasses/contacts?: No Does the patient have difficulty concentrating, remembering, or making decisions?: No Patient able to express need for assistance with ADLs?: Yes Does the patient have difficulty dressing or bathing?: Yes Independently performs ADLs?: No Communication: Independent Dressing (OT): Needs assistance Is this a change from baseline?: Change from baseline, expected to last <3days Grooming: Needs assistance Is this a change from baseline?: Change from baseline, expected to last <3 days Feeding: Independent Bathing: Needs assistance Is this a change from baseline?: Change from baseline, expected to last <3 days Toileting: Needs assistance Is this a change from baseline?: Change from baseline, expected to last <3 days In/Out Bed: Needs assistance Is this a change from baseline?: Change from baseline, expected to last <3 days Walks in Home: Needs assistance Is this a change from baseline?: Change from baseline, expected to last <3 days Does the patient have difficulty walking or climbing stairs?: Yes Weakness of Legs: Both Weakness of Arms/Hands: None  Permission Sought/Granted Permission sought to share information with : Oceanographer granted to share information with : Yes, Verbal Permission Granted              Emotional Assessment       Orientation: : Oriented to Self, Oriented to Place, Oriented to  Time, Oriented to Situation Alcohol / Substance Use: Not Applicable Psych Involvement: No (comment)  Admission diagnosis:  Rib fractures [S22.49XA] Acute cystitis without hematuria [N30.00] Fall, initial encounter [W19.XXXA]  Compression fracture of T12 vertebra, sequela [S22.080S] Closed fracture of transverse process of thoracic vertebra, initial encounter (HCC) [S22.009A] Closed fracture of multiple ribs of right side, initial encounter [S22.41XA] Patient  Active Problem List   Diagnosis Date Noted   Rib fractures 12/24/2022   Pulmonary nodules 08/14/2022   Open angle 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 AT [address] OF [address] [place] [address] Phone: [phone] Fax: [phone]  [facility] [address] [place] [address] Phone: [phone] Fax: [phone]     Social Determinants of Health (SDOH) Social History: SDOH Screenings   Food Insecurity: No Food Insecurity (12/24/2022)  Housing: Low Risk  (12/24/2022)  Transportation Needs: No Transportation Needs (12/24/2022)  Utilities: Not At Risk (12/24/2022)  Depression (PHQ2-9): Medium Risk (10/22/2022)  Tobacco Use: Medium Risk (12/11/2022)   SDOH Interventions:     Readmission Risk Interventions     No data to display

## 2022-12-26 NOTE — Plan of Care (Signed)
  Problem: Education: Goal: Knowledge of General Education information will improve Description: Including pain rating scale, medication(s)/side effects and non-pharmacologic comfort measures Outcome: Progressing   Problem: Pain Managment: Goal: General experience of comfort will improve Outcome: Progressing   Problem: Skin Integrity: Goal: Risk for impaired skin integrity will decrease Outcome: Progressing   

## 2022-12-27 MED ORDER — PANTOPRAZOLE SODIUM 40 MG PO TBEC
40.0000 mg | DELAYED_RELEASE_TABLET | Freq: Every day | ORAL | Status: DC
  Administered 2022-12-27 – 2022-12-29 (×3): 40 mg via ORAL
  Filled 2022-12-27 (×3): qty 1

## 2022-12-27 MED ORDER — BISMUTH SUBSALICYLATE 262 MG/15ML PO SUSP
30.0000 mL | Freq: Four times a day (QID) | ORAL | Status: DC | PRN
  Administered 2022-12-27: 30 mL via ORAL
  Filled 2022-12-27: qty 236

## 2022-12-27 NOTE — Plan of Care (Signed)

## 2022-12-27 NOTE — Progress Notes (Signed)
    Assessment & Plan: HD#4 GLF R Rib fxs 9,10  - pain control, IS R T8-9 TVP Fxs  - pain control A fib  - eliquis, diltiazem HTN  - home meds resumed Anxiety   FEN - regular diet, SLIV VTE - eliquis ID - none needed Dispo - SNF, patient is medically stable for DC when bed available        Darnell Level, MD [facility] A [facility] practice Office: [phone]        Chief Complaint: Ground level fall  Subjective: Patient in bed, pleasant.  Pain controlled.  Objective: Vital signs in last 24 hours: Temp:  [97.7 F (36.5 C)-98.4 F (36.9 C)] 97.7 F (36.5 C) (07/13 0806) Pulse Rate:  [55-84] 77 (07/13 0806) Resp:  [16-19] 19 (07/13 0806) BP: (120-159)/(63-84) 139/72 (07/13 0806) SpO2:  [94 %-97 %] 97 % (07/13 0806) Last BM Date : 12/24/22  Intake/Output from previous day: No intake/output data recorded. Intake/Output this shift: No intake/output data recorded.  Physical Exam: HEENT - sclerae clear, mucous membranes moist Deferred   Lab Results:  Recent Labs    12/24/22 1752 12/24/22 1804 12/25/22 0255  WBC 9.7  --  10.6*  HGB 15.1* 16.0* 14.3  HCT 43.6 47.0* 41.7  PLT 331  --  323   BMET Recent Labs    12/24/22 1752 12/24/22 1804 12/25/22 0255  NA 129* 129* 129*  K 4.8 4.6 4.7  CL 95* 97* 95*  CO2 23  --  25  GLUCOSE 122* 119* 134*  BUN 15 16 9   CREATININE 0.72 0.70 0.63  CALCIUM 9.5  --  9.1   PT/INR Recent Labs    12/24/22 1752  LABPROT 17.4*  INR 1.4*   Comprehensive Metabolic Panel:    Component Value Date/Time   NA 129 (L) 12/25/2022 0255   NA 129 (L) 12/24/2022 1804   NA 137 02/15/2021 0852   K 4.7 12/25/2022 0255   K 4.6 12/24/2022 1804   CL 95 (L) 12/25/2022 0255   CL 97 (L) 12/24/2022 1804   CO2 25 12/25/2022 0255   CO2 23 12/24/2022 1752   BUN 9 12/25/2022 0255   BUN 16 12/24/2022 1804   BUN 17 02/15/2021 0852   CREATININE 0.63 12/25/2022 0255   CREATININE 0.70 12/24/2022 1804   GLUCOSE 134 (H)  12/25/2022 0255   GLUCOSE 119 (H) 12/24/2022 1804   CALCIUM 9.1 12/25/2022 0255   CALCIUM 9.5 12/24/2022 1752   AST 30 12/24/2022 1752   AST 18 09/05/2022 1035   ALT 16 12/24/2022 1752   ALT 14 09/05/2022 1035   ALKPHOS 74 12/24/2022 1752   ALKPHOS 63 09/05/2022 1035   BILITOT 0.8 12/24/2022 1752   BILITOT 0.8 09/05/2022 1035   PROT 6.4 (L) 12/24/2022 1752   PROT 6.9 09/05/2022 1035   ALBUMIN 3.8 12/24/2022 1752   ALBUMIN 4.0 09/05/2022 1035    Studies/Results: No results found.    Darnell Level 12/27/2022  Patient ID: Whitney Burton, female   DOB: 1932-11-07, 89 y.o.   MRN: [id number]

## 2022-12-28 DIAGNOSIS — M4854XD Collapsed vertebra, not elsewhere classified, thoracic region, subsequent encounter for fracture with routine healing: Secondary | ICD-10-CM | POA: Diagnosis present

## 2022-12-28 DIAGNOSIS — I10 Essential (primary) hypertension: Secondary | ICD-10-CM | POA: Diagnosis not present

## 2022-12-28 DIAGNOSIS — K5900 Constipation, unspecified: Secondary | ICD-10-CM | POA: Diagnosis not present

## 2022-12-28 DIAGNOSIS — I4819 Other persistent atrial fibrillation: Secondary | ICD-10-CM | POA: Diagnosis not present

## 2022-12-28 DIAGNOSIS — S2249XA Multiple fractures of ribs, unspecified side, initial encounter for closed fracture: Secondary | ICD-10-CM | POA: Diagnosis not present

## 2022-12-28 DIAGNOSIS — E871 Hypo-osmolality and hyponatremia: Secondary | ICD-10-CM

## 2022-12-28 DIAGNOSIS — I5033 Acute on chronic diastolic (congestive) heart failure: Secondary | ICD-10-CM | POA: Diagnosis not present

## 2022-12-28 DIAGNOSIS — Z90711 Acquired absence of uterus with remaining cervical stump: Secondary | ICD-10-CM | POA: Diagnosis not present

## 2022-12-28 DIAGNOSIS — K589 Irritable bowel syndrome without diarrhea: Secondary | ICD-10-CM | POA: Diagnosis not present

## 2022-12-28 DIAGNOSIS — R2689 Other abnormalities of gait and mobility: Secondary | ICD-10-CM | POA: Diagnosis not present

## 2022-12-28 DIAGNOSIS — Z881 Allergy status to other antibiotic agents status: Secondary | ICD-10-CM | POA: Diagnosis not present

## 2022-12-28 DIAGNOSIS — R278 Other lack of coordination: Secondary | ICD-10-CM | POA: Diagnosis not present

## 2022-12-28 DIAGNOSIS — G4733 Obstructive sleep apnea (adult) (pediatric): Secondary | ICD-10-CM | POA: Diagnosis not present

## 2022-12-28 DIAGNOSIS — Z79899 Other long term (current) drug therapy: Secondary | ICD-10-CM | POA: Diagnosis not present

## 2022-12-28 DIAGNOSIS — H2510 Age-related nuclear cataract, unspecified eye: Secondary | ICD-10-CM | POA: Diagnosis not present

## 2022-12-28 DIAGNOSIS — Z87891 Personal history of nicotine dependence: Secondary | ICD-10-CM | POA: Diagnosis not present

## 2022-12-28 DIAGNOSIS — N3000 Acute cystitis without hematuria: Secondary | ICD-10-CM | POA: Diagnosis not present

## 2022-12-28 DIAGNOSIS — F419 Anxiety disorder, unspecified: Secondary | ICD-10-CM | POA: Diagnosis present

## 2022-12-28 DIAGNOSIS — S2241XA Multiple fractures of ribs, right side, initial encounter for closed fracture: Secondary | ICD-10-CM | POA: Diagnosis not present

## 2022-12-28 DIAGNOSIS — M1990 Unspecified osteoarthritis, unspecified site: Secondary | ICD-10-CM | POA: Diagnosis present

## 2022-12-28 DIAGNOSIS — Z9181 History of falling: Secondary | ICD-10-CM | POA: Diagnosis not present

## 2022-12-28 DIAGNOSIS — R531 Weakness: Secondary | ICD-10-CM | POA: Diagnosis not present

## 2022-12-28 DIAGNOSIS — K5903 Drug induced constipation: Secondary | ICD-10-CM | POA: Diagnosis not present

## 2022-12-28 DIAGNOSIS — H539 Unspecified visual disturbance: Secondary | ICD-10-CM | POA: Diagnosis not present

## 2022-12-28 DIAGNOSIS — S2231XD Fracture of one rib, right side, subsequent encounter for fracture with routine healing: Secondary | ICD-10-CM | POA: Diagnosis not present

## 2022-12-28 DIAGNOSIS — I480 Paroxysmal atrial fibrillation: Secondary | ICD-10-CM | POA: Diagnosis not present

## 2022-12-28 DIAGNOSIS — R918 Other nonspecific abnormal finding of lung field: Secondary | ICD-10-CM | POA: Diagnosis not present

## 2022-12-28 DIAGNOSIS — M6281 Muscle weakness (generalized): Secondary | ICD-10-CM | POA: Diagnosis not present

## 2022-12-28 DIAGNOSIS — M4856XD Collapsed vertebra, not elsewhere classified, lumbar region, subsequent encounter for fracture with routine healing: Secondary | ICD-10-CM | POA: Diagnosis present

## 2022-12-28 DIAGNOSIS — I878 Other specified disorders of veins: Secondary | ICD-10-CM | POA: Diagnosis not present

## 2022-12-28 DIAGNOSIS — R0989 Other specified symptoms and signs involving the circulatory and respiratory systems: Secondary | ICD-10-CM | POA: Diagnosis not present

## 2022-12-28 DIAGNOSIS — Z7901 Long term (current) use of anticoagulants: Secondary | ICD-10-CM | POA: Diagnosis not present

## 2022-12-28 DIAGNOSIS — F32A Depression, unspecified: Secondary | ICD-10-CM | POA: Diagnosis not present

## 2022-12-28 DIAGNOSIS — E875 Hyperkalemia: Secondary | ICD-10-CM | POA: Diagnosis not present

## 2022-12-28 DIAGNOSIS — E869 Volume depletion, unspecified: Secondary | ICD-10-CM | POA: Diagnosis not present

## 2022-12-28 DIAGNOSIS — Z7401 Bed confinement status: Secondary | ICD-10-CM | POA: Diagnosis not present

## 2022-12-28 DIAGNOSIS — E222 Syndrome of inappropriate secretion of antidiuretic hormone: Secondary | ICD-10-CM | POA: Diagnosis not present

## 2022-12-28 DIAGNOSIS — W010XXA Fall on same level from slipping, tripping and stumbling without subsequent striking against object, initial encounter: Secondary | ICD-10-CM | POA: Diagnosis present

## 2022-12-28 DIAGNOSIS — Z888 Allergy status to other drugs, medicaments and biological substances status: Secondary | ICD-10-CM | POA: Diagnosis not present

## 2022-12-28 DIAGNOSIS — T40605A Adverse effect of unspecified narcotics, initial encounter: Secondary | ICD-10-CM | POA: Diagnosis not present

## 2022-12-28 DIAGNOSIS — K219 Gastro-esophageal reflux disease without esophagitis: Secondary | ICD-10-CM | POA: Diagnosis not present

## 2022-12-28 DIAGNOSIS — F39 Unspecified mood [affective] disorder: Secondary | ICD-10-CM | POA: Diagnosis present

## 2022-12-28 DIAGNOSIS — J90 Pleural effusion, not elsewhere classified: Secondary | ICD-10-CM | POA: Diagnosis not present

## 2022-12-28 DIAGNOSIS — S22068A Other fracture of T7-T8 thoracic vertebra, initial encounter for closed fracture: Secondary | ICD-10-CM | POA: Diagnosis not present

## 2022-12-28 DIAGNOSIS — R2681 Unsteadiness on feet: Secondary | ICD-10-CM | POA: Diagnosis not present

## 2022-12-28 DIAGNOSIS — I110 Hypertensive heart disease with heart failure: Secondary | ICD-10-CM | POA: Diagnosis not present

## 2022-12-28 DIAGNOSIS — E785 Hyperlipidemia, unspecified: Secondary | ICD-10-CM | POA: Diagnosis not present

## 2022-12-28 DIAGNOSIS — H40023 Open angle with borderline findings, high risk, bilateral: Secondary | ICD-10-CM | POA: Diagnosis not present

## 2022-12-28 DIAGNOSIS — R062 Wheezing: Secondary | ICD-10-CM | POA: Diagnosis not present

## 2022-12-28 LAB — BASIC METABOLIC PANEL
Anion gap: 6 (ref 5–15)
Anion gap: 10 (ref 5–15)
BUN: 17 mg/dL (ref 8–23)
BUN: 22 mg/dL (ref 8–23)
CO2: 26 mmol/L (ref 22–32)
CO2: 22 mmol/L (ref 22–32)
Calcium: 9.1 mg/dL (ref 8.9–10.3)
Calcium: 8.9 mg/dL (ref 8.9–10.3)
Chloride: 89 mmol/L — ABNORMAL LOW (ref 98–111)
Chloride: 86 mmol/L — ABNORMAL LOW (ref 98–111)
Creatinine, Ser: 0.6 mg/dL (ref 0.44–1.00)
Creatinine, Ser: 0.71 mg/dL (ref 0.44–1.00)
GFR, Estimated: >60 mL/min (ref >60)
GFR, Estimated: >60 mL/min (ref >60)
Glucose, Bld: 107 mg/dL — ABNORMAL HIGH (ref 70–99)
Glucose, Bld: 124 mg/dL — ABNORMAL HIGH (ref 70–99)
Potassium: 5.2 mmol/L — ABNORMAL HIGH (ref 3.5–5.1)
Potassium: 5 mmol/L (ref 3.5–5.1)
Sodium: 121 mmol/L — ABNORMAL LOW (ref 135–145)
Sodium: 118 mmol/L — CL (ref 135–145)

## 2022-12-28 LAB — CBC
HCT: 37 % (ref 36.0–46.0)
Hemoglobin: 13 g/dL (ref 12.0–15.0)
MCH: 33.4 pg (ref 26.0–34.0)
MCHC: 35.1 g/dL (ref 30.0–36.0)
MCV: 95.1 fL (ref 80.0–100.0)
Platelets: 292 10*3/uL (ref 150–400)
RBC: 3.89 MIL/uL (ref 3.87–5.11)
RDW: 15.2 % (ref 11.5–15.5)
WBC: 10.9 10*3/uL — ABNORMAL HIGH (ref 4.0–10.5)
nRBC: 0 % (ref 0.0–0.2)

## 2022-12-28 LAB — OSMOLALITY: Osmolality: 257 mOsm/kg — ABNORMAL LOW (ref 275–295)

## 2022-12-28 MED ORDER — SENNA 8.6 MG PO TABS
2.0000 | ORAL_TABLET | Freq: Every day | ORAL | Status: DC
  Administered 2022-12-28 – 2022-12-29 (×2): 17.2 mg via ORAL
  Filled 2022-12-28 (×2): qty 2

## 2022-12-28 MED ORDER — SODIUM CHLORIDE 0.9 % IV SOLN: INTRAVENOUS | Status: DC

## 2022-12-28 MED ORDER — GLYCERIN (LAXATIVE) 2 G RE SUPP: 1.0000 | Freq: Every day | RECTAL | Status: DC | PRN

## 2022-12-28 MED ORDER — POLYETHYLENE GLYCOL 3350 17 G PO PACK
17.0000 g | PACK | Freq: Two times a day (BID) | ORAL | Status: DC
  Administered 2022-12-29 – 2022-12-31 (×3): 17 g via ORAL
  Filled 2022-12-28 (×4): qty 1

## 2022-12-28 NOTE — Progress Notes (Signed)
Noted old dressing on the left lower arm and asked pt about it-she stated EMS placed it when they came. DC'd dressing and there was a skin tear underneath, soaked the dressing in saline and very carefully got the old dried blood gauze off without doing further damage to the skin tear. Placed xeroform gauze over the skin tear and then a rectangular foam over it. Explained to pt how the foam works and pt pleased that the old dressing is off.

## 2022-12-28 NOTE — Consult Note (Signed)
Initial Consultation Note   Patient: Whitney Burton ZOX:[id number] DOB: 01/07/1933 PCP: Deatra James, MD DOA: 12/24/2022 DOS: the patient was seen and examined on 12/28/2022 Primary service: Md, Trauma, MD  Referring physician: Gaynelle Adu Reason for consult: Hyponatremia   Assessment/Plan: Assessment and Plan:  Severe hyponatremia, hypotonic  Na 121, on admission was 129. Baseline appears to be 131. Serum osm 257. Likely due to volume depletion. No hx of CHF, cirrhosis, nephrotic syndrome, hypoalbuminemia. No recent diarrhea, vomiting, polydipsia, excessive diuretic use. Pt reports she has had poor PO intake since being in hospital. On exam: dry MM on exam, no peripheral edema -Follow up urine osmolality  -Follow up urine sodium  -Check TSH  -Strict Is and Os  -NS 70cc/hr overnight -Encourage PO intake  -Repeat BMP am   Hyperkalemia K 5.2 this morning, baseline is 4.1-4.7. Could be 2/2 hemolysis. -STAT BMP  Constipation No BM since prior to admission 4 days ago.  -Miralax BID -Senna 2 tabs daily -Glycerin suppository PRN -Continue colace   TRH will continue to follow the patient.  HPI: Whitney Burton is a 89 y.o. female with past medical history of HLD, HTN, Afib, Anxiety, Rib fracture, breast cancer. Admitted on 12/25/22 following ground level fall. Patient lost her balance and fell on her back. CT showed: Acute nondisplaced fractures of right T8 and T9 transverse processes. Displaced posterior right ninth and tenth rib fractures. Admitted under trauma surgeons for pain control and to improve functional status.  Review of Systems: As mentioned in the history of present illness. All other systems reviewed and are negative. Past Medical History:  Diagnosis Date   A-fib [facility])    Anxiety    Arrhythmia    Breast cancer (HCC)    Left Breast   Hypertension    Past Surgical History:  Procedure Laterality Date   HEMORRHOID SURGERY     LIVER SURGERY     found lumps in late 40s    PARTIAL HYSTERECTOMY     Social History:  reports that she has quit smoking. She has never used smokeless tobacco. She reports current alcohol use of about 1.0 standard drink of alcohol per week. She reports that she does not use drugs.  Allergies  Allergen Reactions   Ciprofloxacin Hcl     Other reaction(s): Unknown   Levofloxacin     Other reaction(s): Unknown   Ondansetron Hcl     Other reaction(s): constipation   Furosemide Itching    Itching after taking Lasix?    Family History  Problem Relation Age of Onset   Heart attack Father    Pancreatic cancer Brother    Colon cancer Neg Hx    Rectal cancer Neg Hx    Stomach cancer Neg Hx    Esophageal cancer Neg Hx    Liver cancer Neg Hx     Prior to Admission medications   Medication Sig Start Date End Date Taking? Authorizing Provider  acetaminophen (TYLENOL) 500 MG tablet Take 500 mg by mouth every 6 (six) hours as needed for mild pain.   Yes [provider]  ELIQUIS 5 MG TABS tablet TAKE 1 TABLET BY MOUTH TWICE A DAY 10/02/22  Yes O'Neal, Ronnald Ramp, MD  spironolactone (ALDACTONE) 25 MG tablet Take 1 tablet (25 mg total) by mouth daily. Patient taking differently: Take 12.5 mg by mouth daily. Take 1/2 Tablet Daily 02/24/22  Yes O'Neal, Ronnald Ramp, MD  TIADYLT ER 240 MG 24 hr capsule TAKE 1 CAPSULE BY MOUTH EVERY DAY  04/07/22  Yes O'Neal, Ronnald Ramp, MD  torsemide (DEMADEX) 20 MG tablet Take 1 tablet (20 mg total) by mouth as needed (Edema). 12/11/21  Yes O'Neal, Ronnald Ramp, MD  Cholecalciferol (VITAMIN D3) 25 MCG (1000 UT) CAPS Take by mouth. Take 1000 IU daily, unsure of dose    [provider]  citalopram (CELEXA) 10 MG tablet 1 tablet Orally Once a day for 30 days 10/22/22   Alyson Reedy, FNP  linaclotide [facility]) 72 MCG capsule Take 72 mcg by mouth daily before breakfast. Patient not taking: Reported on 12/25/2022    [provider]  montelukast (SINGULAIR) 10 MG tablet Take 10 mg by  mouth at bedtime. Patient not taking: Reported on 12/25/2022    [provider]    Physical Exam: Vitals:   12/28/22 0528 12/28/22 0906 12/28/22 0907 12/28/22 1739  BP: (!) 133/56 120/73 120/73 116/70  Pulse: 84 92 92 99  Resp: 17  18 18   Temp: 98.1 F (36.7 C) 97.6 F (36.4 C) 97.6 F (36.4 C) 98.3 F (36.8 C)  TempSrc: Oral Oral Oral Oral  SpO2: 97% 97% 97% 97%   General: Alert, no acute distress, frail appearing, appears stated age, dry MM  Cardio: Normal S1 and S2, RRR, no r/m/g Pulm: CTAB, normal work of breathing Abdomen: Bowel sounds normal. Abdomen soft and non-tender.  Extremities: No peripheral edema.  Neuro: Cranial nerves grossly intact   Data Reviewed:   There are no new results to review at this time.    Family Communication:  Primary team communication:  Thank you very much for involving [place] in the care of your patient.  Author: Rolm Gala, MD 12/28/2022 6:44 PM  For on call review [email or web address].

## 2022-12-28 NOTE — Plan of Care (Signed)
Pt discouraged that she feels like she is weaker today and more pain mid back area.

## 2022-12-28 NOTE — Progress Notes (Signed)
    Assessment & Plan: HD#5 GLF R Rib fxs 9,10  - pain control, IS R T8-9 TVP Fxs  - pain control A fib  - eliquis, diltiazem HTN  - home meds resumed Anxiety   FEN - regular diet, SLIV VTE - eliquis ID - none needed Dispo - SNF, patient is medically stable for DC when bed available - hopefully Monday 7/15        Darnell Level, MD [facility] practice Office: [phone]        Chief Complaint: Ground level fall, rib fractures  Subjective: Patient up in chair. Some pain.  Ate breakfast.  Objective: Vital signs in last 24 hours: Temp:  [97.6 F (36.4 C)-98.1 F (36.7 C)] 97.6 F (36.4 C) (07/14 0907) Pulse Rate:  [80-92] 92 (07/14 0907) Resp:  [16-20] 18 (07/14 0907) BP: (120-162)/(56-83) 120/73 (07/14 0907) SpO2:  [97 %-98 %] 97 % (07/14 0907) Last BM Date : 12/24/22  Intake/Output from previous day: No intake/output data recorded. Intake/Output this shift: No intake/output data recorded.  Physical Exam: HEENT - sclerae clear, mucous membranes moist Neuro - alert & oriented, no focal deficits  Lab Results:  Recent Labs    12/28/22 0018  WBC 10.9*  HGB 13.0  HCT 37.0  PLT 292   BMET Recent Labs    12/28/22 0018  NA 121*  K 5.2*  CL 89*  CO2 26  GLUCOSE 107*  BUN 17  CREATININE 0.60  CALCIUM 9.1   PT/INR No results for input(s): "LABPROT", "INR" in the last 72 hours. Comprehensive Metabolic Panel:    Component Value Date/Time   NA 121 (L) 12/28/2022 0018   NA 129 (L) 12/25/2022 0255   NA 137 02/15/2021 0852   K 5.2 (H) 12/28/2022 0018   K 4.7 12/25/2022 0255   CL 89 (L) 12/28/2022 0018   CL 95 (L) 12/25/2022 0255   CO2 26 12/28/2022 0018   CO2 25 12/25/2022 0255   BUN 17 12/28/2022 0018   BUN 9 12/25/2022 0255   BUN 17 02/15/2021 0852   CREATININE 0.60 12/28/2022 0018   CREATININE 0.63 12/25/2022 0255   GLUCOSE 107 (H) 12/28/2022 0018   GLUCOSE 134 (H) 12/25/2022 0255   CALCIUM 9.1 12/28/2022 0018    CALCIUM 9.1 12/25/2022 0255   AST 30 12/24/2022 1752   AST 18 09/05/2022 1035   ALT 16 12/24/2022 1752   ALT 14 09/05/2022 1035   ALKPHOS 74 12/24/2022 1752   ALKPHOS 63 09/05/2022 1035   BILITOT 0.8 12/24/2022 1752   BILITOT 0.8 09/05/2022 1035   PROT 6.4 (L) 12/24/2022 1752   PROT 6.9 09/05/2022 1035   ALBUMIN 3.8 12/24/2022 1752   ALBUMIN 4.0 09/05/2022 1035    Studies/Results: No results found.    Darnell Level 12/28/2022  Patient ID: Whitney Burton, Whitney Burton   DOB: 05-Nov-1932, 89 y.o.   MRN: [id number]

## 2022-12-29 ENCOUNTER — Inpatient Hospital Stay (HOSPITAL_COMMUNITY): Payer: Medicare Other

## 2022-12-29 DIAGNOSIS — S2241XA Multiple fractures of ribs, right side, initial encounter for closed fracture: Secondary | ICD-10-CM

## 2022-12-29 LAB — BASIC METABOLIC PANEL
Anion gap: 7 (ref 5–15)
Anion gap: 9 (ref 5–15)
Anion gap: 10 (ref 5–15)
Anion gap: 9 (ref 5–15)
BUN: 18 mg/dL (ref 8–23)
BUN: 15 mg/dL (ref 8–23)
BUN: 16 mg/dL (ref 8–23)
BUN: 16 mg/dL (ref 8–23)
CO2: 24 mmol/L (ref 22–32)
CO2: 22 mmol/L (ref 22–32)
CO2: 21 mmol/L — ABNORMAL LOW (ref 22–32)
CO2: 23 mmol/L (ref 22–32)
Calcium: 8.6 mg/dL — ABNORMAL LOW (ref 8.9–10.3)
Calcium: 8.7 mg/dL — ABNORMAL LOW (ref 8.9–10.3)
Calcium: 8.8 mg/dL — ABNORMAL LOW (ref 8.9–10.3)
Calcium: 8.9 mg/dL (ref 8.9–10.3)
Chloride: 90 mmol/L — ABNORMAL LOW (ref 98–111)
Chloride: 91 mmol/L — ABNORMAL LOW (ref 98–111)
Chloride: 92 mmol/L — ABNORMAL LOW (ref 98–111)
Chloride: 92 mmol/L — ABNORMAL LOW (ref 98–111)
Creatinine, Ser: 0.6 mg/dL (ref 0.44–1.00)
Creatinine, Ser: 0.59 mg/dL (ref 0.44–1.00)
Creatinine, Ser: 0.57 mg/dL (ref 0.44–1.00)
Creatinine, Ser: 0.58 mg/dL (ref 0.44–1.00)
GFR, Estimated: >60 mL/min (ref >60)
GFR, Estimated: >60 mL/min (ref >60)
GFR, Estimated: >60 mL/min (ref >60)
GFR, Estimated: >60 mL/min (ref >60)
Glucose, Bld: 93 mg/dL (ref 70–99)
Glucose, Bld: 92 mg/dL (ref 70–99)
Glucose, Bld: 117 mg/dL — ABNORMAL HIGH (ref 70–99)
Glucose, Bld: 105 mg/dL — ABNORMAL HIGH (ref 70–99)
Potassium: 4.8 mmol/L (ref 3.5–5.1)
Potassium: 4.4 mmol/L (ref 3.5–5.1)
Potassium: 4.7 mmol/L (ref 3.5–5.1)
Potassium: 4.4 mmol/L (ref 3.5–5.1)
Sodium: 121 mmol/L — ABNORMAL LOW (ref 135–145)
Sodium: 122 mmol/L — ABNORMAL LOW (ref 135–145)
Sodium: 123 mmol/L — ABNORMAL LOW (ref 135–145)
Sodium: 124 mmol/L — ABNORMAL LOW (ref 135–145)

## 2022-12-29 LAB — BRAIN NATRIURETIC PEPTIDE: B Natriuretic Peptide: 237.1 pg/mL — ABNORMAL HIGH (ref 0.0–100.0)

## 2022-12-29 LAB — TSH: TSH: 1.599 u[IU]/mL (ref 0.350–4.500)

## 2022-12-29 MED ORDER — PANTOPRAZOLE SODIUM 40 MG PO TBEC
40.0000 mg | DELAYED_RELEASE_TABLET | Freq: Two times a day (BID) | ORAL | Status: DC
  Administered 2022-12-29 – 2022-12-31 (×4): 40 mg via ORAL
  Filled 2022-12-29 (×4): qty 1

## 2022-12-29 MED ORDER — LEVALBUTEROL HCL 0.63 MG/3ML IN NEBU: 0.6300 mg | INHALATION_SOLUTION | Freq: Three times a day (TID) | RESPIRATORY_TRACT | Status: DC

## 2022-12-29 MED ORDER — BENZONATATE 100 MG PO CAPS
100.0000 mg | ORAL_CAPSULE | Freq: Three times a day (TID) | ORAL | Status: DC
  Administered 2022-12-29 – 2022-12-31 (×6): 100 mg via ORAL
  Filled 2022-12-29 (×6): qty 1

## 2022-12-29 MED ORDER — SODIUM CHLORIDE 1 G PO TABS
1.0000 g | ORAL_TABLET | Freq: Three times a day (TID) | ORAL | Status: DC
  Administered 2022-12-29: 1 g via ORAL
  Filled 2022-12-29 (×2): qty 1

## 2022-12-29 MED ORDER — LINACLOTIDE 72 MCG PO CAPS
72.0000 ug | ORAL_CAPSULE | Freq: Every day | ORAL | Status: DC
  Administered 2022-12-30 – 2022-12-31 (×2): 72 ug via ORAL
  Filled 2022-12-29 (×2): qty 1

## 2022-12-29 MED ORDER — LIDOCAINE 5 % EX PTCH
2.0000 | MEDICATED_PATCH | CUTANEOUS | Status: DC
  Administered 2022-12-29 – 2022-12-30 (×2): 2 via TRANSDERMAL
  Filled 2022-12-29 (×2): qty 2

## 2022-12-29 MED ORDER — LEVALBUTEROL HCL 0.63 MG/3ML IN NEBU
0.6300 mg | INHALATION_SOLUTION | Freq: Four times a day (QID) | RESPIRATORY_TRACT | Status: DC | PRN
  Administered 2022-12-30: 0.63 mg via RESPIRATORY_TRACT

## 2022-12-29 MED ORDER — DM-GUAIFENESIN ER 30-600 MG PO TB12
1.0000 | ORAL_TABLET | Freq: Two times a day (BID) | ORAL | Status: DC
  Administered 2022-12-29 – 2022-12-31 (×5): 1 via ORAL
  Filled 2022-12-29 (×5): qty 1

## 2022-12-29 MED ORDER — SENNOSIDES-DOCUSATE SODIUM 8.6-50 MG PO TABS
1.0000 | ORAL_TABLET | Freq: Two times a day (BID) | ORAL | Status: DC
  Administered 2022-12-30 – 2022-12-31 (×2): 1 via ORAL
  Filled 2022-12-29 (×2): qty 1

## 2022-12-29 MED ORDER — KETOROLAC TROMETHAMINE 15 MG/ML IJ SOLN: 15.0000 mg | Freq: Four times a day (QID) | INTRAMUSCULAR | Status: DC

## 2022-12-29 MED ORDER — TORSEMIDE 20 MG PO TABS
20.0000 mg | ORAL_TABLET | Freq: Once | ORAL | Status: AC
  Administered 2022-12-29: 20 mg via ORAL
  Filled 2022-12-29: qty 1

## 2022-12-29 MED ORDER — BISACODYL 10 MG RE SUPP
10.0000 mg | Freq: Once | RECTAL | Status: AC
  Administered 2022-12-29: 10 mg via RECTAL
  Filled 2022-12-29: qty 1

## 2022-12-29 MED ORDER — NAPROXEN 250 MG PO TABS
500.0000 mg | ORAL_TABLET | Freq: Two times a day (BID) | ORAL | Status: DC
  Administered 2022-12-29 – 2022-12-30 (×2): 500 mg via ORAL
  Filled 2022-12-29 (×2): qty 2

## 2022-12-29 MED ORDER — SODIUM CHLORIDE 1 G PO TABS
2.0000 g | ORAL_TABLET | Freq: Three times a day (TID) | ORAL | Status: DC
  Administered 2022-12-29 – 2022-12-31 (×6): 2 g via ORAL
  Filled 2022-12-29 (×8): qty 2

## 2022-12-29 MED ORDER — SENNOSIDES-DOCUSATE SODIUM 8.6-50 MG PO TABS
2.0000 | ORAL_TABLET | Freq: Once | ORAL | Status: AC
  Administered 2022-12-29: 2 via ORAL
  Filled 2022-12-29: qty 2

## 2022-12-29 MED ORDER — LEVALBUTEROL HCL 0.63 MG/3ML IN NEBU
0.6300 mg | INHALATION_SOLUTION | Freq: Three times a day (TID) | RESPIRATORY_TRACT | Status: DC
  Filled 2022-12-29: qty 3

## 2022-12-29 NOTE — Progress Notes (Signed)
[facility] Consultation Progress Note  Patient: Whitney Burton ZOX:[id number]   PCP: Deatra James, MD DOB: 1933-01-17   DOA: 12/24/2022   DOS: 12/29/2022   Date of Service: the patient was seen and examined on 12/29/2022 Primary service: Md, Trauma, MD   Brief hospital course: PMH of HTN, HLD, A-fib, anxiety, breast cancer presented to the hospital with a fall which was mechanical.  Workup showed that she had T8 and T9 transverse process fracture as well as right ninth and 10th rib fracture. Admitted to trauma surgery.  Manage conservatively.  Developed hyponatremia and therefore hospitalist service was consulted. Assessment and Plan: Acute on chronic hypoosmolar hyponatremia. Suspect in the setting of volume overload. Patient was treated with IV normal saline. Urine sodium pending. TSH normal.  Osmolality low. Will discontinue IV fluid and switch to sodium tablets. Sodium level already improving. Continue free water. SIADH in the setting of pain and trauma also most likely.  Constipation. In the setting of poor mobility as well as pain control with narcotics. Dulcolax suppository. Bowel regimen changed.  Hyperkalemia. Should be better with IV diuresis as well as stopping fluids.  Acute on chronic diastolic CHF. Takes torsemide at home. Appears to be volume overloaded with BNP elevated. Chest x-ray negative for any pneumonia. Allergic to Lasix. Will give her home dose of Lasix torsemide.  Atrial fibrillation, paroxysmal. Heart rate remains to be elevated.  On Cardizem.  Also on Eliquis. Will place her on telemetry.  Bilateral expiratory wheezing. Likely in the setting of CHF. Unsure if the patient actually has any acute infection. Xopenex 4 times daily as needed. Continue pain control. Also had cough regimen.  Mood disorder. Continue Celexa.  Pain control. Patient was on Toradol which was resumed although pharmacy requested to use another agent and therefore currently  on naproxen.   Principal Problem:   Rib fractures   We will continue to follow the patient.    Subjective: No nausea or vomiting.  Reports increasing cough and shortness of breath started this morning.  Also reports wheezing.  No chest pain.  Reports pain related to her right rib fracture radiating to the front.  Objective: Vitals:   12/28/22 1949 12/29/22 0403 12/29/22 0853 12/29/22 1435  BP: 115/70 111/69 (!) 148/68 134/73  Pulse: 75 64 60 75  Resp: 20 20 16 17   Temp: 98 F (36.7 C) 98.3 F (36.8 C) (!) 97.5 F (36.4 C) (!) 97.5 F (36.4 C)  TempSrc: Oral Oral Oral Oral  SpO2: 96% 97% 95% 96%    Bilateral expiratory wheezing.  Basal crackles. S1-S2 present.  Irregular. Bowel sound present.  Nontender. Bilateral edema. No calf tenderness.  Family Communication: Family at bedside  Data Reviewed: Since last encounter, pertinent lab results CBC and BMP   . I have ordered test including CBC and BMP  . I have discussed pt's care plan and test results with general surgery  .  Ordered x-ray chest and x-ray abdomen.  Author: Lynden Oxford, MD  [facility] 12/29/2022  6:45 PM  To reach On-call, Look up on care teams to locate the [facility] team or provider name and reach out to them via secure chat or [email or web address] Between 7PM-7AM, please contact night-coverage. If you still have difficulty reaching the attending provider, please page the [facility] (Director on Call) for [facility] on amion for assistance.

## 2022-12-29 NOTE — Progress Notes (Signed)
Physical Therapy Treatment Patient Details Name: Whitney Burton MRN: [id number] DOB: 22-May-1933 Today's Date: 12/29/2022   History of Present Illness 40F s/p mechanical GLF 12/24/22, reports losing her balance. Lives at an independent living facility. Reports landing on her back. PMH-a-fib, anxiety, breast cancer, HTN,    PT Comments  Pt tolerated treatment well today. Pt with similar presentation to previous session. No change in DC/DME recs at this time. Pt will continue to follow.    Assistance Recommended at Discharge Frequent or constant Supervision/Assistance  If plan is discharge home, recommend the following:  Can travel by private vehicle    A little help with walking and/or transfers;A little help with bathing/dressing/bathroom;Assistance with cooking/housework;Assist for transportation   Yes  Equipment Recommendations  None recommended by PT    Recommendations for Other Services       Precautions / Restrictions Precautions Precautions: Fall Precaution Comments: states she always goes backwards without warning Restrictions Weight Bearing Restrictions: No     Mobility  Bed Mobility Overal bed mobility: Needs Assistance Bed Mobility: Supine to Sit     Supine to sit: Supervision     General bed mobility comments: Use of bedrails and increased time    Transfers Overall transfer level: Needs assistance Equipment used: Rolling walker (2 wheels) Transfers: Sit to/from Stand Sit to Stand: Min assist           General transfer comment: Cues for hand placement. Daughter held down RW    Ambulation/Gait Ambulation/Gait assistance: Min guard Gait Distance (Feet): 140 Feet Assistive device: Rolling walker (2 wheels) Gait Pattern/deviations: Step-through pattern, Decreased stride length Gait velocity: reduced     General Gait Details: no LOB noted. Pt moves very well once upright.   Stairs             Wheelchair Mobility     Tilt Bed    Modified  Rankin (Stroke Patients Only)       Balance Overall balance assessment: History of Falls                                          Cognition Arousal/Alertness: Awake/alert Behavior During Therapy: WFL for tasks assessed/performed Overall Cognitive Status: Within Functional Limits for tasks assessed                                          Exercises      General Comments General comments (skin integrity, edema, etc.): VSS      Pertinent Vitals/Pain Pain Assessment Pain Assessment: Faces Faces Pain Scale: Hurts little more Pain Location: Ribs and spine Pain Descriptors / Indicators: Grimacing, Guarding, Moaning Pain Intervention(s): Monitored during session, Limited activity within patient's tolerance, Repositioned    Home Living                          Prior Function            PT Goals (current goals can now be found in the care plan section) Progress towards PT goals: Progressing toward goals    Frequency    Min 3X/week      PT Plan Current plan remains appropriate    Co-evaluation              AM-PAC PT "6  Clicks" Mobility   Outcome Measure  Help needed turning from your back to your side while in a flat bed without using bedrails?: A Little Help needed moving from lying on your back to sitting on the side of a flat bed without using bedrails?: A Little Help needed moving to and from a bed to a chair (including a wheelchair)?: A Little Help needed standing up from a chair using your arms (e.g., wheelchair or bedside chair)?: A Little Help needed to walk in hospital room?: A Little Help needed climbing 3-5 steps with a railing? : Total 6 Click Score: 16    End of Session Equipment Utilized During Treatment: Gait belt Activity Tolerance: Patient tolerated treatment well Patient left: Other (comment);with family/visitor present (In bathroom with daughter) Nurse Communication: Mobility status PT Visit  Diagnosis: Unsteadiness on feet (R26.81);History of falling (Z91.81);Pain Pain - Right/Left: Right     Time: 6962-9528 PT Time Calculation (min) (ACUTE ONLY): 15 min  Charges:    $Gait Training: 8-22 mins PT General Charges $$ ACUTE PT VISIT: 1 Visit                     [name], PT, DPT Acute Rehab Services [phone]    [name] 12/29/2022, 3:40 PM

## 2022-12-29 NOTE — Hospital Course (Signed)
Brief hospital course: PMH of HTN, HLD, A-fib, anxiety, breast cancer presented to the hospital with a fall which was mechanical.  Workup showed that she had T8 and T9 transverse process fracture as well as right ninth and 10th rib fracture. Admitted to trauma surgery.  Manage conservatively.  Developed hyponatremia and therefore hospitalist service was consulted. Assessment and Plan: Acute on chronic hypoosmolar hyponatremia. Suspect in the setting of volume overload. Patient was treated with IV normal saline. Urine sodium pending. TSH normal.  Osmolality low. Will discontinue IV fluid and switch to sodium tablets. Sodium level already improving. Continue free water. SIADH in the setting of pain and trauma also most likely.  Constipation. In the setting of poor mobility as well as pain control with narcotics. Dulcolax suppository. Bowel regimen changed.  Hyperkalemia. Should be better with IV diuresis as well as stopping fluids.  Acute on chronic diastolic CHF. Takes torsemide at home. Appears to be volume overloaded with BNP elevated. Chest x-ray negative for any pneumonia. Allergic to Lasix. Will give her home dose of Lasix torsemide.  Atrial fibrillation, paroxysmal. Heart rate remains to be elevated.  On Cardizem.  Also on Eliquis. Will place her on telemetry.  Bilateral expiratory wheezing. Likely in the setting of CHF. Unsure if the patient actually has any acute infection. Xopenex 4 times daily as needed. Continue pain control. Also had cough regimen.  Mood disorder. Continue Celexa.  Pain control. Patient was on Toradol which was resumed although pharmacy requested to use another agent and therefore currently on naproxen.

## 2022-12-29 NOTE — Progress Notes (Addendum)
    Assessment & Plan: HD#6 GLF R Rib fxs 9,10  - pain control, IS R T8-9 TVP Fxs  - pain control A fib  - eliquis, diltiazem HTN  - home meds resumed Anxiety Hyponatremia -  currently on salt tabs and NS at 70 mL/hr  FEN - regular diet, SLIV VTE - eliquis ID - none needed Dispo - SNF, stable for D/C from trauma perspective. TRH managing hyponatremia          Hosie Spangle, [facility] practice Office: [phone]        Chief Complaint: Ground level fall, rib fractures  Subjective: Some pain of chest wall.  Eating breakfast.  Objective: Vital signs in last 24 hours: Temp:  [97.5 F (36.4 C)-98.3 F (36.8 C)] 97.5 F (36.4 C) (07/15 0853) Pulse Rate:  [60-99] 60 (07/15 0853) Resp:  [16-20] 16 (07/15 0853) BP: (111-148)/(68-70) 148/68 (07/15 0853) SpO2:  [95 %-97 %] 95 % (07/15 0853) Last BM Date : 12/24/22  Intake/Output from previous day: 07/14 0701 - 07/15 0700 In: 720 [P.O.:720] Out: 400 [Urine:400] Intake/Output this shift: Total I/O In: -  Out: 100 [Urine:100]  Physical Exam: HEENT - sclerae clear, mucous membranes moist Neuro - alert & oriented, no focal deficits  Lab Results:  Recent Labs    12/28/22 0018  WBC 10.9*  HGB 13.0  HCT 37.0  PLT 292   BMET Recent Labs    12/28/22 1912 12/29/22 0417  NA 118* 121*  K 5.0 4.8  CL 86* 90*  CO2 22 24  GLUCOSE 124* 93  BUN 22 18  CREATININE 0.71 0.60  CALCIUM 8.9 8.6*   PT/INR No results for input(s): "LABPROT", "INR" in the last 72 hours. Comprehensive Metabolic Panel:    Component Value Date/Time   NA 121 (L) 12/29/2022 0417   NA 118 (LL) 12/28/2022 1912   NA 137 02/15/2021 0852   K 4.8 12/29/2022 0417   K 5.0 12/28/2022 1912   CL 90 (L) 12/29/2022 0417   CL 86 (L) 12/28/2022 1912   CO2 24 12/29/2022 0417   CO2 22 12/28/2022 1912   BUN 18 12/29/2022 0417   BUN 22 12/28/2022 1912   BUN 17 02/15/2021 0852   CREATININE 0.60 12/29/2022 0417    CREATININE 0.71 12/28/2022 1912   GLUCOSE 93 12/29/2022 0417   GLUCOSE 124 (H) 12/28/2022 1912   CALCIUM 8.6 (L) 12/29/2022 0417   CALCIUM 8.9 12/28/2022 1912   AST 30 12/24/2022 1752   AST 18 09/05/2022 1035   ALT 16 12/24/2022 1752   ALT 14 09/05/2022 1035   ALKPHOS 74 12/24/2022 1752   ALKPHOS 63 09/05/2022 1035   BILITOT 0.8 12/24/2022 1752   BILITOT 0.8 09/05/2022 1035   PROT 6.4 (L) 12/24/2022 1752   PROT 6.9 09/05/2022 1035   ALBUMIN 3.8 12/24/2022 1752   ALBUMIN 4.0 09/05/2022 1035    Studies/Results: No results found.    Whitney Burton 12/29/2022  Patient ID: Whitney Burton, female   DOB: [birth year], 89 y.o.   MRN: [id number]

## 2022-12-29 NOTE — Progress Notes (Signed)
Mobility Specialist Progress Note:    12/29/22 1329  Mobility  Activity Ambulated with assistance in hallway  Level of Assistance Minimal assist, patient does 75% or more  Assistive Device Front wheel walker  Distance Ambulated (ft) 160 ft  Activity Response Tolerated well  Mobility Referral Yes  $Mobility charge 1 Mobility  Mobility Specialist Start Time (ACUTE ONLY) 1200  Mobility Specialist Stop Time (ACUTE ONLY) 1213  Mobility Specialist Time Calculation (min) (ACUTE ONLY) 13 min   Pt received in bed, hesitant but agreeable to ambulate. Pt needed MinA for bed mobility and bed mobility. C/o rib pain during session rated it an 8/10 otherwise no c/o. Pt assisted back to bed w/ call bell and personal belongings in hand. All needs met.  Thompson Grayer Mobility Specialist  Please contact vis Secure Chat or  Rehab Office [phone]

## 2022-12-30 DIAGNOSIS — S2241XA Multiple fractures of ribs, right side, initial encounter for closed fracture: Secondary | ICD-10-CM | POA: Diagnosis not present

## 2022-12-30 LAB — BASIC METABOLIC PANEL
Anion gap: 10 (ref 5–15)
Anion gap: 8 (ref 5–15)
BUN: 15 mg/dL (ref 8–23)
BUN: 13 mg/dL (ref 8–23)
CO2: 21 mmol/L — ABNORMAL LOW (ref 22–32)
CO2: 25 mmol/L (ref 22–32)
Calcium: 8.9 mg/dL (ref 8.9–10.3)
Calcium: 8.6 mg/dL — ABNORMAL LOW (ref 8.9–10.3)
Chloride: 94 mmol/L — ABNORMAL LOW (ref 98–111)
Chloride: 92 mmol/L — ABNORMAL LOW (ref 98–111)
Creatinine, Ser: 0.57 mg/dL (ref 0.44–1.00)
Creatinine, Ser: 0.68 mg/dL (ref 0.44–1.00)
GFR, Estimated: >60 mL/min (ref >60)
GFR, Estimated: >60 mL/min (ref >60)
Glucose, Bld: 89 mg/dL (ref 70–99)
Glucose, Bld: 118 mg/dL — ABNORMAL HIGH (ref 70–99)
Potassium: 4.4 mmol/L (ref 3.5–5.1)
Potassium: 4.1 mmol/L (ref 3.5–5.1)
Sodium: 125 mmol/L — ABNORMAL LOW (ref 135–145)
Sodium: 125 mmol/L — ABNORMAL LOW (ref 135–145)

## 2022-12-30 MED ORDER — ENSURE ENLIVE PO LIQD
237.0000 mL | Freq: Three times a day (TID) | ORAL | Status: DC
  Administered 2022-12-30 – 2022-12-31 (×3): 237 mL via ORAL

## 2022-12-30 MED ORDER — TORSEMIDE 20 MG PO TABS: ORAL_TABLET | ORAL | Status: DC

## 2022-12-30 MED ORDER — SODIUM CHLORIDE 1 G PO TABS: 2.0000 g | ORAL_TABLET | Freq: Three times a day (TID) | ORAL | 0 refills | Status: AC

## 2022-12-30 MED ORDER — PANTOPRAZOLE SODIUM 40 MG PO TBEC: 40.0000 mg | DELAYED_RELEASE_TABLET | Freq: Every day | ORAL | 0 refills | Status: DC

## 2022-12-30 MED ORDER — ENSURE ENLIVE PO LIQD: 237.0000 mL | Freq: Three times a day (TID) | ORAL | Status: DC

## 2022-12-30 MED ORDER — KETOROLAC TROMETHAMINE 15 MG/ML IJ SOLN
15.0000 mg | Freq: Three times a day (TID) | INTRAMUSCULAR | Status: AC
  Administered 2022-12-30 – 2022-12-31 (×3): 15 mg via INTRAVENOUS
  Filled 2022-12-30 (×3): qty 1

## 2022-12-30 MED ORDER — TORSEMIDE 20 MG PO TABS
20.0000 mg | ORAL_TABLET | Freq: Once | ORAL | Status: AC
  Administered 2022-12-30: 20 mg via ORAL
  Filled 2022-12-30: qty 1

## 2022-12-30 NOTE — Progress Notes (Addendum)
[facility] Consultation Progress Note  Patient: Whitney Burton UJW:[id number]   PCP: Deatra James, MD DOB: Jan 04, 1933   DOA: 12/24/2022   DOS: 12/30/2022   Date of Service: the patient was seen and examined on 12/30/2022 Primary service: Md, Trauma, MD   Brief hospital course: PMH of HTN, HLD, A-fib, anxiety, breast cancer presented to the hospital with a fall which was mechanical.  Workup showed that she had T8 and T9 transverse process fracture as well as right ninth and 10th rib fracture. Admitted to trauma surgery.  Manage conservatively.  Developed hyponatremia and therefore hospitalist service was consulted. Assessment and Plan: Acute on chronic hypoosmolar hyponatremia. Suspect in the setting of volume overload. Patient was treated with IV normal saline. TSH normal.  Osmolality low.  257. Patient was given some IV fluid but I suspect that she is rather volume overloaded. IV fluids were discontinued. Patient was started on sodium tablets. Sodium level improving from 118 at the lowest to now 125 over 24-hour. Patient essentially asymptomatic with regards to sodium without any confusion, seizures or any other mental status changes. At present she is pretty close to her baseline which is around 129. Continue 1800 ml free water restriction. Continue sodium tablets for 3 days Recheck BMP in 1 week. Would recommend continuing torsemide daily for 2 days followed by twice weekly  Constipation. In the setting of poor mobility as well as pain control with narcotics. Resolved with Dulcolax suppository. Bowel regimen continue.  Hyperkalemia. Resolved.  Acute on chronic diastolic CHF. Right posterior pleural effusion. Takes torsemide at home only as needed. Appears to be volume overloaded with BNP elevated. Chest x-ray negative for any pneumonia. There is report for right pleural effusion which is new compared to admission x-ray on 7/11. Currently not on any oxygen. Recommend to  continue incentive spirometry and continue diuresis. No indication for thoracentesis for now. H&H stable thus less likely hemothorax.  Atrial fibrillation, paroxysmal. On Cardizem.  Also on Eliquis. No RVR noted on telemetry.  Bilateral expiratory wheezing. Likely in the setting of CHF. Resolved with diuresis.  Continue Xopenex as needed on discharge.  Mood disorder. Continue Celexa.  Pain control. Patient was on Toradol which was resumed although pharmacy requested to use another agent and therefore currently on naproxen. Recommended patient to stay ahead of the pain.  Disposition. Sodium level 125, close to baseline, patient asymptomatic from hyponatremia perspective and responding well to therapy with diuresis, fluid restriction and salt tablets.  At present as above most of medical issues are stable. Patient will be stable for discharge to SNF as of 7/16. Should the patient stay in the hospital we will continue to follow-up. Recommend to repeat chest x-ray in 1 week for pleural effusion. Repeat BMP in 1 week with magnesium level. Continue torsemide daily for next 2 days and switch to Monday and Friday frequency after that with next dose on 7/22. Continue Ensure 3 times daily. Continue sodium tablets for 3 days.    We will continue to follow the patient.    Subjective: Still has pain.  No nausea or vomiting.  Refused Tylenol this morning.  No abdominal pain.  Had a BM yesterday and this morning.  Oral intake still reported as minimal.  Continues to have cough with no mucus.  Objective: Vitals:   12/29/22 1435 12/29/22 2007 12/30/22 0457 12/30/22 0835  BP: 134/73 139/78 133/70 (!) 141/70  Pulse: 75 83 79 (!) 53  Resp: 17 16 17 18   Temp: (!) 97.5 F (  36.4 C) 98.1 F (36.7 C) 98.4 F (36.9 C) (!) 97.5 F (36.4 C)  TempSrc: Oral Oral  Oral  SpO2: 96% 97% 98% 98%    Resolved expiratory wheezing.  Continues to have some basal crackles. S1-S2 present. Irregular. Bowel  sound present.  Nontender. Improving edema in lower extremity.  Family Communication: Family at bedside on 7/15.  No family at bedside on 7/16.  Data Reviewed: I have reviewed BMP and reordered BMP.  Author: Lynden Oxford, MD  [facility] 12/30/2022  1:41 PM  To reach On-call, Look up on care teams to locate the [facility] team or provider name and reach out to them via secure chat or [email or web address] Between 7PM-7AM, please contact night-coverage. If you still have difficulty reaching the attending provider, please page the [facility] (Director on Call) for [facility] on amion for assistance.

## 2022-12-30 NOTE — Progress Notes (Signed)
Pt requesting tramadol I let her know that it is PRN every 4 hours. Tramadol is due again at 3:27pm. Pt made aware. Scheduled Tylenol given at 1107a. Will continue to monitor pt.

## 2022-12-30 NOTE — Progress Notes (Signed)
Occupational Therapy Treatment Patient Details Name: Whitney Burton MRN: [id number] DOB: 1933/01/30 Today's Date: 12/30/2022   History of present illness 40F s/p mechanical GLF 12/24/22, reports losing her balance. Lives at an independent living facility. Reports landing on her back. PMH-a-fib, anxiety, breast cancer, HTN,   OT comments  Pt doing well, improving with functional endurance and mobility. Pt displays good carryover of learned skilled from last session. Daughter present during session today. Pt further instructed on use of AD to assist in LB dressing, able to with set up and increased time. Pt able to ambulate ~75 feet, x2 standing rest breaks, no LOB. Pt will have to be able to ambulate from her 3rd floor apartment to elevator and to dining hall prior to return home, Pt states she feels like that would be too difficult and she is worried about falling again. Pt instructed on general BLE exercises and activities to improve balance while sitting or standing with therapy to improve BLE strength/stability. Pt would benefit from continued skilled therapy to improve functional endurance as able, DC still appropriate   Recommendations for follow up therapy are one component of a multi-disciplinary discharge planning process, led by the attending physician.  Recommendations may be updated based on patient status, additional functional criteria and insurance authorization.    Assistance Recommended at Discharge Intermittent Supervision/Assistance  Patient can return home with the following  A little help with walking and/or transfers;A little help with bathing/dressing/bathroom;Assistance with cooking/housework;Assist for transportation;Help with stairs or ramp for entrance   Equipment Recommendations  None recommended by OT    Recommendations for Other Services      Precautions / Restrictions Precautions Precautions: Fall Precaution Comments: states she always goes backwards without  warning Restrictions Weight Bearing Restrictions: No       Mobility Bed Mobility               General bed mobility comments: arrived in chair    Transfers Overall transfer level: Needs assistance Equipment used: Rolling walker (2 wheels) Transfers: Sit to/from Stand Sit to Stand: Min guard           General transfer comment: doing well with transfers, no LOB     Balance Overall balance assessment: History of Falls                                         ADL either performed or assessed with clinical judgement   ADL Overall ADL's : Needs assistance/impaired Eating/Feeding: Independent   Grooming: Supervision/safety;Standing               Lower Body Dressing: Set up;With adaptive equipment;Sitting/lateral leans   Toilet Transfer: Min guard   Toileting- Clothing Manipulation and Hygiene: Modified independent       Functional mobility during ADLs: Min guard General ADL Comments: Good carryover of learned skills from prior session, able to use sock aide/reacher/dressing stick to don/doff socks. Pt displayed good ability to don/doff pants min guard with sit to stand unsupported, no LOB    Extremity/Trunk Assessment Upper Extremity Assessment Upper Extremity Assessment: Overall WFL for tasks assessed RUE Deficits / Details: some pain with overhead/reaching activities with RUE, but is able to complete functional activities RUE Sensation: WNL RUE Coordination: decreased gross motor            Vision       Perception     Praxis  Cognition Arousal/Alertness: Awake/alert Behavior During Therapy: WFL for tasks assessed/performed Overall Cognitive Status: Within Functional Limits for tasks assessed                                          Exercises      Shoulder Instructions       General Comments      Pertinent Vitals/ Pain       Pain Assessment Pain Assessment: 0-10 Pain Score: 5  Faces Pain  Scale: Hurts a little bit Pain Location: Ribs and spine Pain Descriptors / Indicators: Aching Pain Intervention(s): Monitored during session  Home Living                                          Prior Functioning/Environment              Frequency  Min 2X/week        Progress Toward Goals  OT Goals(current goals can now be found in the care plan section)  Progress towards OT goals: Progressing toward goals  Acute Rehab OT Goals Patient Stated Goal: to improve endurance and functional independence prior to return home OT Goal Formulation: With patient Time For Goal Achievement: 01/08/23 Potential to Achieve Goals: Good ADL Goals Pt Will Perform Lower Body Dressing: with set-up;sit to/from stand;with adaptive equipment Pt Will Transfer to Toilet: with modified independence;ambulating Additional ADL Goal #1: Pt will be able to tolerate standing ADLs at sink with no LOB for 5 mins with mod I, displaying good safety awareness  Plan Discharge plan remains appropriate    Co-evaluation                 AM-PAC OT "6 Clicks" Daily Activity     Outcome Measure   Help from another person eating meals?: None Help from another person taking care of personal grooming?: A Little Help from another person toileting, which includes using toliet, bedpan, or urinal?: A Little Help from another person bathing (including washing, rinsing, drying)?: A Little Help from another person to put on and taking off regular upper body clothing?: A Little Help from another person to put on and taking off regular lower body clothing?: A Little 6 Click Score: 19    End of Session Equipment Utilized During Treatment: Gait belt;Rolling walker (2 wheels)  OT Visit Diagnosis: Unsteadiness on feet (R26.81);Other abnormalities of gait and mobility (R26.89);Repeated falls (R29.6);Muscle weakness (generalized) (M62.81);Pain Pain - Right/Left: Right Pain - part of body:  (ribs)    Activity Tolerance Patient tolerated treatment well   Patient Left in chair;with call bell/phone within reach;with family/visitor present   Nurse Communication Mobility status        Time: 1530-1555 OT Time Calculation (min): 25 min  Charges: OT General Charges $OT Visit: 1 Visit OT Treatments $Self Care/Home Management : 8-22 mins $Therapeutic Activity: 8-22 mins  Demitrios Molyneux, OTR/L   Alexis Goodell 12/30/2022, 4:06 PM

## 2022-12-30 NOTE — Progress Notes (Signed)
Subjective: Continues to complain of pain in her chest, but still won't take her pain medication for this.  She seems to forget that she has this, not totally clear why she won't take this more frequently to help with her pain.    ROS: See above, otherwise other systems negative  Objective: Vital signs in last 24 hours: Temp:  [97.5 F (36.4 C)-98.4 F (36.9 C)] 97.5 F (36.4 C) (07/16 0835) Pulse Rate:  [53-83] 53 (07/16 0835) Resp:  [16-18] 18 (07/16 0835) BP: (133-141)/(70-78) 141/70 (07/16 0835) SpO2:  [96 %-98 %] 98 % (07/16 0835) Last BM Date : 12/30/22  Intake/Output from previous day: 07/15 0701 - 07/16 0700 In: 240 [P.O.:240] Out: 2350 [Urine:2350] Intake/Output this shift: No intake/output data recorded.  PE: Gen: NAD laying in bed Lungs: respiratory effort nonlabored Abd: NT Ext: MAE Psych: A&Ox3  Lab Results:  Recent Labs    12/28/22 0018  WBC 10.9*  HGB 13.0  HCT 37.0  PLT 292   BMET Recent Labs    12/30/22 0716 12/30/22 1205  NA 125* 125*  K 4.4 4.1  CL 94* 92*  CO2 21* 25  GLUCOSE 89 118*  BUN 15 13  CREATININE 0.57 0.68  CALCIUM 8.9 8.6*   PT/INR No results for input(s): "LABPROT", "INR" in the last 72 hours.  CMP     Component Value Date/Time   NA 125 (L) 12/30/2022 1205   NA 137 02/15/2021 0852   K 4.1 12/30/2022 1205   CL 92 (L) 12/30/2022 1205   CO2 25 12/30/2022 1205   GLUCOSE 118 (H) 12/30/2022 1205   BUN 13 12/30/2022 1205   BUN 17 02/15/2021 0852   CREATININE 0.68 12/30/2022 1205   CALCIUM 8.6 (L) 12/30/2022 1205   PROT 6.4 (L) 12/24/2022 1752   ALBUMIN 3.8 12/24/2022 1752   AST 30 12/24/2022 1752   ALT 16 12/24/2022 1752   ALKPHOS 74 12/24/2022 1752   BILITOT 0.8 12/24/2022 1752   GFRNONAA >60 12/30/2022 1205   Lipase  No results found for: "LIPASE"     Studies/Results: DG Abd Portable 1V  Result Date: 12/29/2022 CLINICAL DATA:  Constipation. EXAM: PORTABLE ABDOMEN - 1 VIEW COMPARISON:  None  Available. FINDINGS: The bowel gas pattern is normal. No significant stool burden is noted. Surgical staples are seen in right side of abdomen. Phlebolith is noted in the pelvis. IMPRESSION: No abnormal bowel dilatation. Electronically Signed   By: Lupita Raider M.D.   On: 12/29/2022 16:35   DG CHEST PORT 1 VIEW  Result Date: 12/29/2022 CLINICAL DATA:  Constipation and wheezing. EXAM: PORTABLE CHEST 1 VIEW COMPARISON:  12/25/2022 FINDINGS: Stable cardiac enlargement. Lung volumes are low. Right posterior layering pleural effusion is noted with veil like opacification over the right mid and right lower lung. No airspace opacities or signs of interstitial edema. Remote healed right posterior rib fractures. IMPRESSION: 1. Right posterior layering pleural effusion. 2. Cardiomegaly. Electronically Signed   By: Signa Kell M.D.   On: 12/29/2022 16:35    Anti-infectives: Anti-infectives (From admission, onward)    Start     Dose/Rate Route Frequency Ordered Stop   12/24/22 2215  cefTRIAXone (ROCEPHIN) 1 g in sodium chloride 0.9 % 100 mL IVPB        1 g 200 mL/hr over 30 Minutes Intravenous  Once 12/24/22 2204 12/24/22 2309        Assessment/Plan Mechanical GLD R Rib fxs 9,10 - pain control, IS,  encouraged patient to take her tramadol today to help with pain control. R t8, T9 TVP Fxs - pain control A fib - eliquis, diltiazem HTN - home meds resumed Anxiety Hyponatremia - Na tabs, Na 125, check in am, asymptomatic.  Mentating well with no issues. FEN - regular diet, SLIV VTE - eliquis ID - none needed Dispo - SNF likely tomorrow   I reviewed nursing notes, Consultant hospitalist notes, last 24 h vitals and pain scores, last 48 h intake and output, last 24 h labs and trends, and last 24 h imaging results.   LOS: 2 days    Letha Cape , [facility] 12/30/2022, 1:47 PM Please see Amion for pager number during day hours 7:00am-4:30pm or 7:00am -11:30am on  weekends

## 2022-12-30 NOTE — Progress Notes (Signed)
Mobility Specialist Progress Note:    12/30/22 1545  Mobility  Activity Ambulated with assistance in hallway  Level of Assistance Contact guard assist, steadying assist  Assistive Device Front wheel walker  Distance Ambulated (ft) 140 ft  Activity Response Tolerated well  Mobility Referral Yes  $Mobility charge 1 Mobility  Mobility Specialist Start Time (ACUTE ONLY) 1500  Mobility Specialist Stop Time (ACUTE ONLY) 1518  Mobility Specialist Time Calculation (min) (ACUTE ONLY) 18 min   Pt received in chair, agreeable to ambulate. Pt require no physical assistance for STS and contact guard during ambulation. During sessio pt had to be corrected w/ RW proximity d/t it being to afar away from her. Pt was bale to maintain correction. Pt assisted back to chair w/ call bell and personal belongings at reach.   Thompson Grayer Mobility Specialist  Please contact vis Secure Chat or  Rehab Office [phone]

## 2022-12-31 DIAGNOSIS — R918 Other nonspecific abnormal finding of lung field: Secondary | ICD-10-CM | POA: Diagnosis not present

## 2022-12-31 DIAGNOSIS — K219 Gastro-esophageal reflux disease without esophagitis: Secondary | ICD-10-CM | POA: Diagnosis not present

## 2022-12-31 DIAGNOSIS — M546 Pain in thoracic spine: Secondary | ICD-10-CM | POA: Diagnosis not present

## 2022-12-31 DIAGNOSIS — I10 Essential (primary) hypertension: Secondary | ICD-10-CM | POA: Diagnosis not present

## 2022-12-31 DIAGNOSIS — Z9181 History of falling: Secondary | ICD-10-CM | POA: Diagnosis not present

## 2022-12-31 DIAGNOSIS — R2681 Unsteadiness on feet: Secondary | ICD-10-CM | POA: Diagnosis not present

## 2022-12-31 DIAGNOSIS — S2231XD Fracture of one rib, right side, subsequent encounter for fracture with routine healing: Secondary | ICD-10-CM | POA: Diagnosis not present

## 2022-12-31 DIAGNOSIS — Z7401 Bed confinement status: Secondary | ICD-10-CM | POA: Diagnosis not present

## 2022-12-31 DIAGNOSIS — H539 Unspecified visual disturbance: Secondary | ICD-10-CM | POA: Diagnosis not present

## 2022-12-31 DIAGNOSIS — S22068A Other fracture of T7-T8 thoracic vertebra, initial encounter for closed fracture: Secondary | ICD-10-CM | POA: Diagnosis not present

## 2022-12-31 DIAGNOSIS — S51812D Laceration without foreign body of left forearm, subsequent encounter: Secondary | ICD-10-CM | POA: Diagnosis not present

## 2022-12-31 DIAGNOSIS — K589 Irritable bowel syndrome without diarrhea: Secondary | ICD-10-CM | POA: Diagnosis not present

## 2022-12-31 DIAGNOSIS — I4819 Other persistent atrial fibrillation: Secondary | ICD-10-CM | POA: Diagnosis not present

## 2022-12-31 DIAGNOSIS — S2249XA Multiple fractures of ribs, unspecified side, initial encounter for closed fracture: Secondary | ICD-10-CM | POA: Diagnosis not present

## 2022-12-31 DIAGNOSIS — R531 Weakness: Secondary | ICD-10-CM | POA: Diagnosis not present

## 2022-12-31 DIAGNOSIS — E871 Hypo-osmolality and hyponatremia: Secondary | ICD-10-CM | POA: Diagnosis not present

## 2022-12-31 DIAGNOSIS — H40023 Open angle with borderline findings, high risk, bilateral: Secondary | ICD-10-CM | POA: Diagnosis not present

## 2022-12-31 DIAGNOSIS — R278 Other lack of coordination: Secondary | ICD-10-CM | POA: Diagnosis not present

## 2022-12-31 DIAGNOSIS — H2510 Age-related nuclear cataract, unspecified eye: Secondary | ICD-10-CM | POA: Diagnosis not present

## 2022-12-31 DIAGNOSIS — M6281 Muscle weakness (generalized): Secondary | ICD-10-CM | POA: Diagnosis not present

## 2022-12-31 DIAGNOSIS — R2689 Other abnormalities of gait and mobility: Secondary | ICD-10-CM | POA: Diagnosis not present

## 2022-12-31 DIAGNOSIS — Z4789 Encounter for other orthopedic aftercare: Secondary | ICD-10-CM | POA: Diagnosis not present

## 2022-12-31 DIAGNOSIS — S2241XA Multiple fractures of ribs, right side, initial encounter for closed fracture: Secondary | ICD-10-CM | POA: Diagnosis not present

## 2022-12-31 DIAGNOSIS — F32A Depression, unspecified: Secondary | ICD-10-CM | POA: Diagnosis not present

## 2022-12-31 LAB — BASIC METABOLIC PANEL
Anion gap: 12 (ref 5–15)
BUN: 21 mg/dL (ref 8–23)
CO2: 23 mmol/L (ref 22–32)
Calcium: 8.9 mg/dL (ref 8.9–10.3)
Chloride: 94 mmol/L — ABNORMAL LOW (ref 98–111)
Creatinine, Ser: 0.67 mg/dL (ref 0.44–1.00)
GFR, Estimated: >60 mL/min (ref >60)
Glucose, Bld: 96 mg/dL (ref 70–99)
Potassium: 4.1 mmol/L (ref 3.5–5.1)
Sodium: 129 mmol/L — ABNORMAL LOW (ref 135–145)

## 2022-12-31 MED ORDER — POLYETHYLENE GLYCOL 3350 17 G PO PACK: 17.0000 g | PACK | Freq: Every day | ORAL | Status: DC | PRN

## 2022-12-31 MED ORDER — LIDOCAINE 5 % EX PTCH: 2.0000 | MEDICATED_PATCH | CUTANEOUS | Status: DC

## 2022-12-31 MED ORDER — TRAMADOL HCL 50 MG PO TABS: 50.0000 mg | ORAL_TABLET | Freq: Four times a day (QID) | ORAL | 0 refills | Status: DC | PRN

## 2022-12-31 MED ORDER — SENNOSIDES-DOCUSATE SODIUM 8.6-50 MG PO TABS: 1.0000 | ORAL_TABLET | Freq: Two times a day (BID) | ORAL | Status: DC | PRN

## 2022-12-31 NOTE — Progress Notes (Signed)
Mobility Specialist Progress Note:    12/31/22 1226  Mobility  Activity Ambulated with assistance to bathroom  Level of Assistance Minimal assist, patient does 75% or more  Assistive Device Front wheel walker  Distance Ambulated (ft) 6 ft  Activity Response Tolerated well  Mobility Referral Yes  $Mobility charge 1 Mobility  Mobility Specialist Start Time (ACUTE ONLY) 1100  Mobility Specialist Stop Time (ACUTE ONLY) 1115  Mobility Specialist Time Calculation (min) (ACUTE ONLY) 15 min   Pt received in bed, agreeable to ambulate. Pt requested to use the BR. Needed MinA to stand and contact guard during ambulation. During ambulation pt experienced a moment of LOB, MS able to readjust balance. Pt left in BR instructed to use the call bell when finished.   Thompson Grayer Mobility Specialist  Please contact vis Secure Chat or  Rehab Office [phone]

## 2022-12-31 NOTE — Care Management Important Message (Signed)
Important Message  Patient Details  Name: Devony Mcgrady MRN: [id number] Date of Birth: 07-Oct-1932   Medicare Important Message Given:  Yes     Sherilyn Banker 12/31/2022, 11:50 AM

## 2022-12-31 NOTE — TOC Transition Note (Signed)
Transition of Care [facility]) - CM/SW Discharge Note   Patient Details  Name: Whitney Burton MRN: [id number] Date of Birth: [birth year]  [facility] CM/SW Contact:  Erin Sons, LCSW Phone Number: 12/31/2022, 1:01 PM   Clinical Narrative:     Patient will DC to: [facility] Anticipated DC date: 12/31/2022 Family notified: Daughter Film/video editor by: Sharin Mons   Per MD patient ready for DC to [facility]. RN, patient, patient's family, and facility notified of DC. Discharge Summary and FL2 sent to facility. RN to call report prior to discharge [phone] Room 604a). DC packet on chart. Ambulance transport requested for patient.   CSW will sign off for now as social work intervention is no longer needed. Please consult [place] again if new needs arise.   Final next level of care: Skilled Nursing Facility Barriers to Discharge: No Barriers Identified   Patient Goals and CMS Choice CMS Medicare.gov Compare Post Acute Care list provided to:: Patient Choice offered to / list presented to : Patient, Adult Children  Discharge Placement                Patient chooses bed at: [facility] Patient to be transferred to facility by: ptar Name of family member notified: Alvino Chapel Patient and family notified of of transfer: 12/31/22  Discharge Plan and Services Additional resources added to the After Visit Summary for       Post Acute Care Choice: Skilled Nursing Facility                               Social Determinants of Health (SDOH) Interventions SDOH Screenings   Food Insecurity: No Food Insecurity (12/24/2022)  Housing: Low Risk  (12/24/2022)  Transportation Needs: No Transportation Needs (12/24/2022)  Utilities: Not At Risk (12/24/2022)  Depression (PHQ2-9): Medium Risk (10/22/2022)  Tobacco Use: Medium Risk (12/11/2022)     Readmission Risk Interventions     No data to display

## 2022-12-31 NOTE — Consult Note (Addendum)
Consult Note   Patient: Whitney Burton ZOX:[id number] DOB: 1933/03/24 DOA: 12/24/2022     3 DOS: the patient was seen and examined on 12/31/2022     Brief hospital course: 89yo female with PMH of HTN, HLD, A-fib, anxiety, and breast cancer who presented on 7/11 with a mechanical fall resulting in T8/T9 transverse process fracture as well as right 9th/10th rib fracture.  Admitted to trauma surgery with conservative management.  Developed hyponatremia and therefore hospitalist service was consulted.  Assessment and Plan: Acute on chronic hypoosmolar hyponatremia Suspect in the setting of volume overload. Patient was treated with IV normal saline. TSH normal.  Osmolality low.  257. Patient was given some IV fluid but I suspect that she is rather volume overloaded. IV fluids were discontinued. Patient was started on sodium tablets. Sodium level improving from 118 at the lowest to now 129 (gradual improvement) Patient essentially asymptomatic with regards to sodium without any confusion, seizures or any other mental status changes. Now back to baseline which is around 129. Continue 1800 ml free water restriction. Continue sodium tablets for total 3 days Recheck BMP in 1 week. Would recommend continuing torsemide daily for 2 days followed by twice weekly  Constipation In the setting of poor mobility as well as pain control with narcotics. Resolved with Dulcolax suppository. Bowel regimen continue.  Acute on chronic diastolic CHF/Right posterior pleural effusion. Takes torsemide at home only as needed. Appeared to be volume overloaded with BNP elevated. Chest x-ray negative for any pneumonia. New right pleural effusion on 7/15 compared to admission x-ray on 7/11. Currently not on any oxygen. Recommend to continue incentive spirometry and continue diuresis. No indication for thoracentesis for now. H&H stable thus less likely hemothorax.  Atrial fibrillation, paroxysmal On Cardizem.  Also  on Eliquis. No RVR noted on telemetry.  Bilateral expiratory wheezing Likely in the setting of CHF. Resolved with diuresis.  Continue Xopenex as needed on discharge.  Mood disorder Continue Celexa.  Pain control Patient was on Toradol which was resumed although pharmacy requested to use another agent and therefore currently on naproxen. Recommended patient to stay ahead of the pain.  Disposition Sodium level 129, at baseline, patient asymptomatic from hyponatremia perspective and responding well to therapy with diuresis, fluid restriction and salt tablets.  At present as above most of medical issues are stable. Patient is stable for discharge to SNF Recommend to repeat chest x-ray in 1 week for pleural effusion. Repeat BMP in 1 week with magnesium level. Continue torsemide daily for next 2 days and switch to Monday and Friday frequency after that with next dose on 7/22. Continue Ensure 3 times daily. Continue sodium tablets for 3 days, through 7/18 TRH will sign off    Subjective: Pleasant, feeling much better but still with limited exertional capacity, poor IS capability.  Agrees that she needs SNF rehab and is ready.   Physical Exam: Vitals:   12/30/22 0835 12/30/22 2049 12/31/22 0455 12/31/22 0928  BP: (!) 141/70 117/68 114/68 128/66  Pulse: (!) 53 84 84 96  Resp: 18 16 16 18   Temp: (!) 97.5 F (36.4 C) 97.7 F (36.5 C) 98 F (36.7 C) 97.7 F (36.5 C)  TempSrc: Oral Oral  Oral  SpO2: 98% 99% 97% 99%   General:  Appears calm and comfortable and is in NAD Eyes:   EOMI, normal lids, iris ENT:  grossly normal hearing, lips & tongue, mmm Neck:  no LAD, masses or thyromegaly Cardiovascular:  RRR, no m/r/g. No LE  edema.  Respiratory:   CTA bilaterally with no wheezes/rales/rhonchi.  Normal respiratory effort. Abdomen:  soft, NT, ND Skin:  no rash or induration seen on limited exam Musculoskeletal:  grossly normal tone BUE/BLE, good ROM, no bony  abnormality Psychiatric:  blunted mood and affect, speech fluent and appropriate, AOx3 Neurologic:  CN 2-12 grossly intact, moves all extremities in coordinated fashion   Radiological Exams on Admission: Independently reviewed - see discussion in A/P where applicable  DG Abd Portable 1V  Result Date: 12/29/2022 CLINICAL DATA:  Constipation. EXAM: PORTABLE ABDOMEN - 1 VIEW COMPARISON:  None Available. FINDINGS: The bowel gas pattern is normal. No significant stool burden is noted. Surgical staples are seen in right side of abdomen. Phlebolith is noted in the pelvis. IMPRESSION: No abnormal bowel dilatation. Electronically Signed   By: Lupita Raider M.D.   On: 12/29/2022 16:35   DG CHEST PORT 1 VIEW  Result Date: 12/29/2022 CLINICAL DATA:  Constipation and wheezing. EXAM: PORTABLE CHEST 1 VIEW COMPARISON:  12/25/2022 FINDINGS: Stable cardiac enlargement. Lung volumes are low. Right posterior layering pleural effusion is noted with veil like opacification over the right mid and right lower lung. No airspace opacities or signs of interstitial edema. Remote healed right posterior rib fractures. IMPRESSION: 1. Right posterior layering pleural effusion. 2. Cardiomegaly. Electronically Signed   By: Signa Kell M.D.   On: 12/29/2022 16:35     Pertinent labs:    Na++ 129   Family Communication: None present  Disposition: Patient appears to be stable for SNF rehab discharge today. Discussed directly with the trauma surgery service (PA Maczis).  TRH will sign off at this time.  Time spent: 55 minutes  Author: Jonah Blue, MD 12/31/2022 2:01 PM  For on call review [email or web address].

## 2022-12-31 NOTE — Discharge Summary (Signed)
Patient ID: Whitney Burton [id number] 11-06-1932 89 y.o.  Admit date: 12/24/2022 Discharge date: 12/31/2022  Discharge Diagnosis Mechanical GLD R Rib fxs 9,10  R t8, T9 TVP Fxs  A fib  HTN  Anxiety Hyponatremia  Consultants TRH  HPI  70F s/p mechanical GLF, reports losing her balance. Lives at an independent living facility. Reports landing on her back. Daughter lives locally   Procedures None  Hospital Course:  Patient admitted after a fall as noted above. Found to have below injuries.   R Rib fxs 9,10 - Tx w/ pain control, IS. Worked with therapies.   R t8, T9 TVP Fxs - tx with multimodal pain control  Hx A fib - TRH following. Restarted eliquis and diltiazem  Hx HTN - home meds resumed  Hyponatremia - Asymptomatic per notes. TRH consulted. Felt to be 2/2 volume overload. Labs obtained. Started on Na tabs and free water restriction. Discussed with TRH on 7/17. Na at 129 and she was felt stable for d/c from their standpoint. Recommend f/u with PCP. Cont Na tabs x 3 days and torsemide daily for 2 days followed by twice weekly. Will need repeat labs in 1 week.   Patient worked with therapies during admission. On 7/17 she was felt stable for d/c to SNF.   Physical Exam: Gen: NAD laying in bed Cards: Reg rate Lungs: CTA b/l. Pulling 500 on IS Abd: Soft, ND, NT Ext: MAE  Allergies as of 12/31/2022       Reactions   Ciprofloxacin Hcl    Other reaction(s): Unknown   Levofloxacin    Other reaction(s): Unknown   Ondansetron Hcl    Other reaction(s): constipation   Furosemide Itching   Itching after taking Lasix?        Medication List     STOP taking these medications    spironolactone 25 MG tablet Commonly known as: ALDACTONE       TAKE these medications    acetaminophen 500 MG tablet Commonly known as: TYLENOL Take 500 mg by mouth every 6 (six) hours as needed for mild pain.   citalopram 10 MG tablet Commonly known as: CELEXA 1 tablet Orally  Once a day for 30 days   Eliquis 5 MG Tabs tablet Generic drug: apixaban TAKE 1 TABLET BY MOUTH TWICE A DAY   feeding supplement Liqd Take 237 mLs by mouth 3 (three) times daily between meals.   lidocaine 5 % Commonly known as: LIDODERM Place 2 patches onto the skin daily. Remove & Discard patch within 12 hours or as directed by MD   Linzess 72 MCG capsule Generic drug: linaclotide Take 72 mcg by mouth daily before breakfast.   montelukast 10 MG tablet Commonly known as: SINGULAIR Take 10 mg by mouth at bedtime.   pantoprazole 40 MG tablet Commonly known as: PROTONIX Take 1 tablet (40 mg total) by mouth daily.   polyethylene glycol 17 g packet Commonly known as: MIRALAX / GLYCOLAX Take 17 g by mouth daily as needed.   senna-docusate 8.6-50 MG tablet Commonly known as: Senokot-S Take 1 tablet by mouth 2 (two) times daily as needed for mild constipation.   sodium chloride 1 g tablet Take 2 tablets (2 g total) by mouth 3 (three) times daily with meals for 3 days.   Tiadylt ER 240 MG 24 hr capsule Generic drug: diltiazem TAKE 1 CAPSULE BY MOUTH EVERY DAY   torsemide 20 MG tablet Commonly known as: DEMADEX Take 20 mg daily for 7/17 and  7/18. Starting 7/22 take every Monday and Friday. Take once daily as needed in between for weight gain on 3 lbs in 1 day or 5 lbs in 1 week What changed:  how much to take how to take this when to take this reasons to take this additional instructions   traMADol 50 MG tablet Commonly known as: ULTRAM Take 1 tablet (50 mg total) by mouth every 6 (six) hours as needed for severe pain.   Vitamin D3 25 MCG (1000 UT) Caps Take by mouth. Take 1000 IU daily, unsure of dose          Contact information for follow-up providers     Deatra James, MD Follow up in 1 week(s).   Specialty: Family Medicine Why: Please follow up in 1 week for repeat labs Contact information: [address] [place]  [address] [phone]              Contact information for after-discharge care     Destination     [facility] .   Service: Skilled Nursing Contact information: [address] Test Update Address [place] [address] [phone]                     Signed: Leary Roca, [facility] 12/31/2022, 10:56 AM Please see Amion for pager number during day hours 7:00am-4:30pm

## 2023-01-01 DIAGNOSIS — S2231XD Fracture of one rib, right side, subsequent encounter for fracture with routine healing: Secondary | ICD-10-CM | POA: Diagnosis not present

## 2023-01-01 DIAGNOSIS — Z9181 History of falling: Secondary | ICD-10-CM | POA: Diagnosis not present

## 2023-01-01 DIAGNOSIS — M546 Pain in thoracic spine: Secondary | ICD-10-CM | POA: Diagnosis not present

## 2023-01-01 DIAGNOSIS — E871 Hypo-osmolality and hyponatremia: Secondary | ICD-10-CM | POA: Diagnosis not present

## 2023-01-06 DIAGNOSIS — M6281 Muscle weakness (generalized): Secondary | ICD-10-CM | POA: Diagnosis not present

## 2023-01-06 DIAGNOSIS — R2681 Unsteadiness on feet: Secondary | ICD-10-CM | POA: Diagnosis not present

## 2023-01-06 DIAGNOSIS — K589 Irritable bowel syndrome without diarrhea: Secondary | ICD-10-CM | POA: Diagnosis not present

## 2023-01-06 DIAGNOSIS — I10 Essential (primary) hypertension: Secondary | ICD-10-CM | POA: Diagnosis not present

## 2023-01-06 DIAGNOSIS — K219 Gastro-esophageal reflux disease without esophagitis: Secondary | ICD-10-CM | POA: Diagnosis not present

## 2023-01-06 DIAGNOSIS — Z4789 Encounter for other orthopedic aftercare: Secondary | ICD-10-CM | POA: Diagnosis not present

## 2023-01-06 DIAGNOSIS — R278 Other lack of coordination: Secondary | ICD-10-CM | POA: Diagnosis not present

## 2023-01-07 DIAGNOSIS — H2510 Age-related nuclear cataract, unspecified eye: Secondary | ICD-10-CM | POA: Diagnosis not present

## 2023-01-07 DIAGNOSIS — F32A Depression, unspecified: Secondary | ICD-10-CM | POA: Diagnosis not present

## 2023-01-07 DIAGNOSIS — M6281 Muscle weakness (generalized): Secondary | ICD-10-CM | POA: Diagnosis not present

## 2023-01-07 DIAGNOSIS — I10 Essential (primary) hypertension: Secondary | ICD-10-CM | POA: Diagnosis not present

## 2023-01-14 DIAGNOSIS — S51812D Laceration without foreign body of left forearm, subsequent encounter: Secondary | ICD-10-CM | POA: Diagnosis not present

## 2023-01-14 DIAGNOSIS — I10 Essential (primary) hypertension: Secondary | ICD-10-CM | POA: Diagnosis not present

## 2023-01-14 DIAGNOSIS — F32A Depression, unspecified: Secondary | ICD-10-CM | POA: Diagnosis not present

## 2023-01-14 DIAGNOSIS — H2510 Age-related nuclear cataract, unspecified eye: Secondary | ICD-10-CM | POA: Diagnosis not present

## 2023-01-15 DIAGNOSIS — I10 Essential (primary) hypertension: Secondary | ICD-10-CM | POA: Diagnosis not present

## 2023-01-16 DIAGNOSIS — M6281 Muscle weakness (generalized): Secondary | ICD-10-CM | POA: Diagnosis not present

## 2023-01-16 DIAGNOSIS — K589 Irritable bowel syndrome without diarrhea: Secondary | ICD-10-CM | POA: Diagnosis not present

## 2023-01-16 DIAGNOSIS — H40023 Open angle with borderline findings, high risk, bilateral: Secondary | ICD-10-CM | POA: Diagnosis not present

## 2023-01-16 DIAGNOSIS — R2689 Other abnormalities of gait and mobility: Secondary | ICD-10-CM | POA: Diagnosis not present

## 2023-01-16 DIAGNOSIS — S2231XD Fracture of one rib, right side, subsequent encounter for fracture with routine healing: Secondary | ICD-10-CM | POA: Diagnosis not present

## 2023-01-19 ENCOUNTER — Telehealth: Payer: Self-pay | Admitting: Cardiovascular Disease

## 2023-01-19 ENCOUNTER — Other Ambulatory Visit: Payer: Self-pay | Admitting: *Deleted

## 2023-01-19 DIAGNOSIS — I5032 Chronic diastolic (congestive) heart failure: Secondary | ICD-10-CM

## 2023-01-19 DIAGNOSIS — M6281 Muscle weakness (generalized): Secondary | ICD-10-CM | POA: Diagnosis not present

## 2023-01-19 MED ORDER — TORSEMIDE 20 MG PO TABS: ORAL_TABLET | ORAL | Status: DC

## 2023-01-19 NOTE — Telephone Encounter (Signed)
Patient states swelling, has none in the morning but increases until the nighttime. States started in hospital and rehab. She states feet looked "like little sausages, but better now".  She is in a independent rehab home now and up more during the day  She states she had sodium pills when in rehab but none at home.   She has no SOB, and states again that her feet are fine in the morning but by end of evening has swelling  She states wearing compression stockings, when she can get them on. Does have difficulty so does get help. Watching sodium intake, though they bring her meals to her. Elevate legs as able during the day.   She takes Torsemide 20 mg. She thinks she had it daily in hospital but not sure.  She has been home 2 days and taking it every day. Med list states Monday and Friday.  Advised to take as directed until recommendations from provider

## 2023-01-19 NOTE — Patient Outreach (Signed)
Per [facility] Whitney Burton discharged from [facility] skilled nursing facility on 01/16/23. Screening for potential care coordination services as benefit of health plan and  Primary Care Provider.  Whitney Burton lived in [facility] prior to admission to [facility].    No identifiable care coordination needs.   Raiford Noble, MSN, RN,BSN [facility] Coordinator [phone] (Direct dial)

## 2023-01-19 NOTE — Telephone Encounter (Signed)
Pt c/o swelling/edema: STAT if pt has developed SOB within 24 hours  If swelling, where is the swelling located?  Legs, feet, toes  How much weight have you gained and in what time span?   Unknown  Have you gained 2 pounds in a day or 5 pounds in a week? Unknown  Do you have a log of your daily weights (if so, list)?   No  Are you currently taking a fluid pill?   Yes  Are you currently SOB?   No, comes in short spells  Have you traveled recently in a car or plane for an extended period of time?  Patient stated in the morning her legs are pretty good but by the evening they are swollen.  Patient wants to know next steps.

## 2023-01-19 NOTE — Telephone Encounter (Signed)
Per doctor  Would recommend she take the torsemide daily. She needs repeat BMP but I think rehab can do this.  Gerri Spore T. Flora Lipps, MD, [facility]   [facility]ide sent to [facility] per her request. She states labs cannot be done at her facility. Lab ordered and she will have it done this week.  She states understanding of all information and repeats back to me.

## 2023-01-20 DIAGNOSIS — R2689 Other abnormalities of gait and mobility: Secondary | ICD-10-CM | POA: Diagnosis not present

## 2023-01-20 DIAGNOSIS — M6281 Muscle weakness (generalized): Secondary | ICD-10-CM | POA: Diagnosis not present

## 2023-01-21 DIAGNOSIS — I5032 Chronic diastolic (congestive) heart failure: Secondary | ICD-10-CM | POA: Diagnosis not present

## 2023-01-22 DIAGNOSIS — M6281 Muscle weakness (generalized): Secondary | ICD-10-CM | POA: Diagnosis not present

## 2023-01-22 DIAGNOSIS — R2689 Other abnormalities of gait and mobility: Secondary | ICD-10-CM | POA: Diagnosis not present

## 2023-01-23 ENCOUNTER — Other Ambulatory Visit: Payer: Self-pay | Admitting: Cardiovascular Disease

## 2023-01-23 ENCOUNTER — Other Ambulatory Visit: Payer: Self-pay | Admitting: *Deleted

## 2023-01-23 ENCOUNTER — Telehealth: Payer: Self-pay | Admitting: Cardiovascular Disease

## 2023-01-23 MED ORDER — TORSEMIDE 20 MG PO TABS: ORAL_TABLET | ORAL | 11 refills | Status: DC

## 2023-01-23 MED ORDER — POTASSIUM CHLORIDE CRYS ER 10 MEQ PO TBCR: 10.0000 meq | EXTENDED_RELEASE_TABLET | ORAL | 5 refills | Status: DC

## 2023-01-23 NOTE — Telephone Encounter (Signed)
Tried calling the pt but she is at a community center and there was no VM or human to speak to. Sent a [employer] as well.

## 2023-01-23 NOTE — Telephone Encounter (Signed)
Pt c/o medication issue:  1. Name of Medication:  torsemide (DEMADEX) 20 MG tablet   2. How are you currently taking this medication (dosage and times per day)? Take 20 mg daily for 7/17 and 7/18. Starting 7/22 take every Monday and Friday. Take once daily as needed in between for weight gain on 3 lbs in 1 day or 5 lbs in 1 week   3. Are you having a reaction (difficulty breathing--STAT)? No  4. What is your medication issue?Patient called stating we were supposed to send a refill on this medication but the [facility] never [facility].   Patient is requesting a call back from the nurse in regards to lab results

## 2023-01-26 NOTE — Telephone Encounter (Signed)
Results and med change sent via My Chart to patient and she has seen message.

## 2023-01-27 DIAGNOSIS — R2689 Other abnormalities of gait and mobility: Secondary | ICD-10-CM | POA: Diagnosis not present

## 2023-01-27 DIAGNOSIS — M6281 Muscle weakness (generalized): Secondary | ICD-10-CM | POA: Diagnosis not present

## 2023-01-29 DIAGNOSIS — M6281 Muscle weakness (generalized): Secondary | ICD-10-CM | POA: Diagnosis not present

## 2023-01-29 DIAGNOSIS — R2689 Other abnormalities of gait and mobility: Secondary | ICD-10-CM | POA: Diagnosis not present

## 2023-02-03 DIAGNOSIS — R2689 Other abnormalities of gait and mobility: Secondary | ICD-10-CM | POA: Diagnosis not present

## 2023-02-03 DIAGNOSIS — M6281 Muscle weakness (generalized): Secondary | ICD-10-CM | POA: Diagnosis not present

## 2023-02-05 DIAGNOSIS — M6281 Muscle weakness (generalized): Secondary | ICD-10-CM | POA: Diagnosis not present

## 2023-02-05 DIAGNOSIS — R2689 Other abnormalities of gait and mobility: Secondary | ICD-10-CM | POA: Diagnosis not present

## 2023-02-10 DIAGNOSIS — M6281 Muscle weakness (generalized): Secondary | ICD-10-CM | POA: Diagnosis not present

## 2023-02-10 DIAGNOSIS — R2689 Other abnormalities of gait and mobility: Secondary | ICD-10-CM | POA: Diagnosis not present

## 2023-02-12 DIAGNOSIS — R2689 Other abnormalities of gait and mobility: Secondary | ICD-10-CM | POA: Diagnosis not present

## 2023-02-12 DIAGNOSIS — M6281 Muscle weakness (generalized): Secondary | ICD-10-CM | POA: Diagnosis not present

## 2023-02-13 DIAGNOSIS — M810 Age-related osteoporosis without current pathological fracture: Secondary | ICD-10-CM | POA: Diagnosis not present

## 2023-02-13 DIAGNOSIS — K219 Gastro-esophageal reflux disease without esophagitis: Secondary | ICD-10-CM | POA: Diagnosis not present

## 2023-02-13 DIAGNOSIS — S2241XD Multiple fractures of ribs, right side, subsequent encounter for fracture with routine healing: Secondary | ICD-10-CM | POA: Diagnosis not present

## 2023-02-13 DIAGNOSIS — W19XXXA Unspecified fall, initial encounter: Secondary | ICD-10-CM | POA: Diagnosis not present

## 2023-02-13 DIAGNOSIS — E440 Moderate protein-calorie malnutrition: Secondary | ICD-10-CM | POA: Diagnosis not present

## 2023-02-17 DIAGNOSIS — M6281 Muscle weakness (generalized): Secondary | ICD-10-CM | POA: Diagnosis not present

## 2023-02-17 DIAGNOSIS — R2689 Other abnormalities of gait and mobility: Secondary | ICD-10-CM | POA: Diagnosis not present

## 2023-02-24 DIAGNOSIS — R2689 Other abnormalities of gait and mobility: Secondary | ICD-10-CM | POA: Diagnosis not present

## 2023-02-24 DIAGNOSIS — M6281 Muscle weakness (generalized): Secondary | ICD-10-CM | POA: Diagnosis not present

## 2023-02-26 DIAGNOSIS — M6281 Muscle weakness (generalized): Secondary | ICD-10-CM | POA: Diagnosis not present

## 2023-02-26 DIAGNOSIS — R2689 Other abnormalities of gait and mobility: Secondary | ICD-10-CM | POA: Diagnosis not present

## 2023-03-03 DIAGNOSIS — M6281 Muscle weakness (generalized): Secondary | ICD-10-CM | POA: Diagnosis not present

## 2023-03-05 DIAGNOSIS — R2689 Other abnormalities of gait and mobility: Secondary | ICD-10-CM | POA: Diagnosis not present

## 2023-03-05 DIAGNOSIS — M6281 Muscle weakness (generalized): Secondary | ICD-10-CM | POA: Diagnosis not present

## 2023-03-09 ENCOUNTER — Telehealth: Payer: Self-pay | Admitting: Cardiovascular Disease

## 2023-03-09 NOTE — Telephone Encounter (Signed)
Patient states since her last OV, she seems winded more than she had been and seems more tired. No chest pain. Last OV states to F/U in 3 months.  Appt made for next week with PA.

## 2023-03-09 NOTE — Telephone Encounter (Signed)
Pt c/o Shortness Of Breath: STAT if SOB developed within the last 24 hours or pt is noticeably SOB on the phone  1. Are you currently SOB (can you hear that pt is SOB on the phone)?  No   2. How long have you been experiencing SOB?  Since she had a fall around the end of July  3. Are you SOB when sitting or when up moving around?  Both   4. Are you currently experiencing any other symptoms?  Tiredness

## 2023-03-10 DIAGNOSIS — R2689 Other abnormalities of gait and mobility: Secondary | ICD-10-CM | POA: Diagnosis not present

## 2023-03-10 DIAGNOSIS — M6281 Muscle weakness (generalized): Secondary | ICD-10-CM | POA: Diagnosis not present

## 2023-03-12 DIAGNOSIS — M6281 Muscle weakness (generalized): Secondary | ICD-10-CM | POA: Diagnosis not present

## 2023-03-12 DIAGNOSIS — R2689 Other abnormalities of gait and mobility: Secondary | ICD-10-CM | POA: Diagnosis not present

## 2023-03-16 DIAGNOSIS — S22080A Wedge compression fracture of T11-T12 vertebra, initial encounter for closed fracture: Secondary | ICD-10-CM | POA: Diagnosis not present

## 2023-03-17 DIAGNOSIS — M6281 Muscle weakness (generalized): Secondary | ICD-10-CM | POA: Diagnosis not present

## 2023-03-17 DIAGNOSIS — R2689 Other abnormalities of gait and mobility: Secondary | ICD-10-CM | POA: Diagnosis not present

## 2023-03-18 ENCOUNTER — Ambulatory Visit: Payer: Medicare Other | Attending: Student | Admitting: Student

## 2023-03-18 ENCOUNTER — Encounter: Payer: Self-pay | Admitting: Student

## 2023-03-18 VITALS — BP 136/73 | HR 109 | Ht 60.0 in | Wt 132.0 lb

## 2023-03-18 DIAGNOSIS — I5032 Chronic diastolic (congestive) heart failure: Secondary | ICD-10-CM | POA: Diagnosis not present

## 2023-03-18 DIAGNOSIS — I4821 Permanent atrial fibrillation: Secondary | ICD-10-CM

## 2023-03-18 DIAGNOSIS — I714 Abdominal aortic aneurysm, without rupture, unspecified: Secondary | ICD-10-CM | POA: Diagnosis not present

## 2023-03-18 DIAGNOSIS — R0602 Shortness of breath: Secondary | ICD-10-CM | POA: Diagnosis not present

## 2023-03-18 DIAGNOSIS — I10 Essential (primary) hypertension: Secondary | ICD-10-CM

## 2023-03-18 MED ORDER — TORSEMIDE 20 MG PO TABS
20.0000 mg | ORAL_TABLET | Freq: Every day | ORAL | Status: DC | PRN
Start: 2023-03-18 — End: 2023-09-24

## 2023-03-18 NOTE — Patient Instructions (Addendum)
Medication Instructions:  Your physician recommends that you continue on your current medications as directed. Please refer to the Current Medication list given to you today.  *If you need a refill on your cardiac medications before your next appointment, please call your pharmacy*   Lab Work: Your physician recommends that you have labs drawn today: BMET, CBC, BNP  If you have labs (blood work) drawn today and your tests are completely normal, you will receive your results only by: MyChart Message (if you have MyChart) OR A paper copy in the mail If you have any lab test that is abnormal or we need to change your treatment, we will call you to review the results.   Testing/Procedures: Your provider has recommended that you have a chest X ray has been ordered ---go to [facility] now --[address]--- There is no appointment needed just show up anytime   Follow-Up: At [facility], you and your health needs are our priority.  As part of our continuing mission to provide you with exceptional heart care, we have created designated Provider Care Teams.  These Care Teams include your primary Cardiologist (physician) and Advanced Practice Providers (APPs -  Physician Assistants and Nurse Practitioners) who all work together to provide you with the care you need, when you need it.  We recommend signing up for the patient portal called "MyChart".  Sign up information is provided on this After Visit Summary.  MyChart is used to connect with patients for Virtual Visits (Telemedicine).  Patients are able to view lab/test results, encounter notes, upcoming appointments, etc.  Non-urgent messages can be sent to your provider as well.   To learn more about what you can do with MyChart, go to [email or web address].    Your next appointment:   2-3 month(s)  Provider:   Marjie Skiff, PA  Then Dr. Flora Lipps in 6 months.

## 2023-03-20 ENCOUNTER — Other Ambulatory Visit: Payer: Self-pay | Admitting: Student

## 2023-03-20 ENCOUNTER — Ambulatory Visit
Admission: RE | Admit: 2023-03-20 | Discharge: 2023-03-20 | Disposition: A | Discharge status: Home / Self Care | Payer: Medicare Other | Source: Ambulatory Visit | Attending: Student | Admitting: Student

## 2023-03-20 DIAGNOSIS — R0602 Shortness of breath: Secondary | ICD-10-CM

## 2023-03-20 DIAGNOSIS — J9811 Atelectasis: Secondary | ICD-10-CM | POA: Diagnosis not present

## 2023-03-20 LAB — BASIC METABOLIC PANEL
BUN/Creatinine Ratio: 15 (ref 12–28)
BUN: 10 mg/dL (ref 8–27)
CO2: 22 mmol/L (ref 20–29)
Calcium: 10.2 mg/dL (ref 8.7–10.3)
Chloride: 96 mmol/L (ref 96–106)
Creatinine, Ser: 0.66 mg/dL (ref 0.57–1.00)
Glucose: 88 mg/dL (ref 70–99)
Potassium: 5 mmol/L (ref 3.5–5.2)
Sodium: 132 mmol/L — ABNORMAL LOW (ref 134–144)
eGFR: 84 mL/min/{1.73_m2} (ref >59)

## 2023-03-20 LAB — CBC
Hematocrit: 48.5 % — ABNORMAL HIGH (ref 34.0–46.6)
Hemoglobin: 16.2 g/dL — ABNORMAL HIGH (ref 11.1–15.9)
MCH: 32.6 pg (ref 26.6–33.0)
MCHC: 33.4 g/dL (ref 31.5–35.7)
MCV: 98 fL — ABNORMAL HIGH (ref 79–97)
Platelets: 434 10*3/uL (ref 150–450)
RBC: 4.97 x10E6/uL (ref 3.77–5.28)
RDW: 13 % (ref 11.7–15.4)
WBC: 7.9 10*3/uL (ref 3.4–10.8)

## 2023-03-20 LAB — BRAIN NATRIURETIC PEPTIDE: BNP: 271 pg/mL — ABNORMAL HIGH (ref 0.0–100.0)

## 2023-03-23 ENCOUNTER — Other Ambulatory Visit: Payer: Self-pay

## 2023-03-24 DIAGNOSIS — M6281 Muscle weakness (generalized): Secondary | ICD-10-CM | POA: Diagnosis not present

## 2023-03-24 DIAGNOSIS — R2689 Other abnormalities of gait and mobility: Secondary | ICD-10-CM | POA: Diagnosis not present

## 2023-04-07 ENCOUNTER — Other Ambulatory Visit: Payer: Self-pay | Admitting: Cardiovascular Disease

## 2023-04-07 DIAGNOSIS — M6281 Muscle weakness (generalized): Secondary | ICD-10-CM | POA: Diagnosis not present

## 2023-04-07 DIAGNOSIS — R2689 Other abnormalities of gait and mobility: Secondary | ICD-10-CM | POA: Diagnosis not present

## 2023-04-07 DIAGNOSIS — R0602 Shortness of breath: Secondary | ICD-10-CM

## 2023-04-08 ENCOUNTER — Other Ambulatory Visit: Payer: Self-pay

## 2023-04-08 DIAGNOSIS — R0602 Shortness of breath: Secondary | ICD-10-CM | POA: Diagnosis not present

## 2023-04-09 LAB — BASIC METABOLIC PANEL
BUN/Creatinine Ratio: 22 (ref 12–28)
BUN: 14 mg/dL (ref 8–27)
CO2: 21 mmol/L (ref 20–29)
Calcium: 9.9 mg/dL (ref 8.7–10.3)
Chloride: 97 mmol/L (ref 96–106)
Creatinine, Ser: 0.65 mg/dL (ref 0.57–1.00)
Glucose: 84 mg/dL (ref 70–99)
Potassium: 5.2 mmol/L (ref 3.5–5.2)
Sodium: 133 mmol/L — ABNORMAL LOW (ref 134–144)
eGFR: 85 mL/min/{1.73_m2} (ref >59)

## 2023-04-10 ENCOUNTER — Other Ambulatory Visit: Payer: Self-pay | Admitting: Student

## 2023-04-10 DIAGNOSIS — I5032 Chronic diastolic (congestive) heart failure: Secondary | ICD-10-CM

## 2023-04-10 NOTE — Progress Notes (Signed)
Ordered a repeat BMET in 3-4 weeks to make sure potassium level is still stable on Spironolactone.  Corrin Parker, PA-C 04/10/2023 6:19 PM

## 2023-04-14 DIAGNOSIS — M6281 Muscle weakness (generalized): Secondary | ICD-10-CM | POA: Diagnosis not present

## 2023-04-14 DIAGNOSIS — R2689 Other abnormalities of gait and mobility: Secondary | ICD-10-CM | POA: Diagnosis not present

## 2023-04-20 ENCOUNTER — Other Ambulatory Visit: Payer: Self-pay | Admitting: Cardiovascular Disease

## 2023-04-21 ENCOUNTER — Telehealth: Payer: Self-pay | Admitting: Cardiovascular Disease

## 2023-04-21 NOTE — Telephone Encounter (Signed)
Spoke to patient and below message relayed.      Notified patient of no acute findings on 04/10/2023 (formal read of chest x-ray was not back at that time but had reviewed imaging with Dr. Flora Lipps)

## 2023-04-21 NOTE — Telephone Encounter (Signed)
Patient would like to follow up on the chest xray she had on 10/4. When [facility] spoke to her about results they were still waiting for the official reading to be done.

## 2023-04-29 DIAGNOSIS — Z1231 Encounter for screening mammogram for malignant neoplasm of breast: Secondary | ICD-10-CM | POA: Diagnosis not present

## 2023-05-06 DIAGNOSIS — B354 Tinea corporis: Secondary | ICD-10-CM | POA: Diagnosis not present

## 2023-05-07 ENCOUNTER — Other Ambulatory Visit: Payer: Self-pay | Admitting: Cardiovascular Disease

## 2023-05-07 ENCOUNTER — Encounter (HOSPITAL_BASED_OUTPATIENT_CLINIC_OR_DEPARTMENT_OTHER): Payer: Self-pay | Admitting: Family Medicine

## 2023-05-07 DIAGNOSIS — I4821 Permanent atrial fibrillation: Secondary | ICD-10-CM

## 2023-05-07 NOTE — Telephone Encounter (Signed)
Eliquis 5mg  refill request received. Patient is 89 years old, weight-59.9kg, Crea-0.65 on 04/08/23, Diagnosis-Afib, and last seen by Dr. Irene Limbo on 03/18/23. Dose is appropriate based on dosing criteria. Will send in refill to requested pharmacy.    Per pharmd okay to keep on current dose since weight 59.9kg

## 2023-05-12 DIAGNOSIS — I4821 Permanent atrial fibrillation: Secondary | ICD-10-CM | POA: Diagnosis not present

## 2023-05-12 DIAGNOSIS — I110 Hypertensive heart disease with heart failure: Secondary | ICD-10-CM | POA: Diagnosis not present

## 2023-05-12 DIAGNOSIS — I10 Essential (primary) hypertension: Secondary | ICD-10-CM | POA: Diagnosis not present

## 2023-05-12 DIAGNOSIS — Z9181 History of falling: Secondary | ICD-10-CM | POA: Diagnosis not present

## 2023-05-12 DIAGNOSIS — I5032 Chronic diastolic (congestive) heart failure: Secondary | ICD-10-CM | POA: Diagnosis not present

## 2023-05-12 DIAGNOSIS — L304 Erythema intertrigo: Secondary | ICD-10-CM | POA: Diagnosis not present

## 2023-05-12 DIAGNOSIS — D6869 Other thrombophilia: Secondary | ICD-10-CM | POA: Diagnosis not present

## 2023-05-12 DIAGNOSIS — M810 Age-related osteoporosis without current pathological fracture: Secondary | ICD-10-CM | POA: Diagnosis not present

## 2023-05-12 DIAGNOSIS — Z0000 Encounter for general adult medical examination without abnormal findings: Secondary | ICD-10-CM | POA: Diagnosis not present

## 2023-05-20 ENCOUNTER — Ambulatory Visit: Payer: Medicare Other | Admitting: Student

## 2023-05-22 ENCOUNTER — Ambulatory Visit: Payer: Medicare Other | Admitting: Cardiology

## 2023-06-08 DIAGNOSIS — L720 Epidermal cyst: Secondary | ICD-10-CM | POA: Diagnosis not present

## 2023-06-08 DIAGNOSIS — L570 Actinic keratosis: Secondary | ICD-10-CM | POA: Diagnosis not present

## 2023-06-08 DIAGNOSIS — L820 Inflamed seborrheic keratosis: Secondary | ICD-10-CM | POA: Diagnosis not present

## 2023-06-08 DIAGNOSIS — L821 Other seborrheic keratosis: Secondary | ICD-10-CM | POA: Diagnosis not present

## 2023-06-17 NOTE — Progress Notes (Signed)
 Cardiology Office Note    Date:  06/19/2023  ID:  Claudean Leavelle, DOB [birth year], MRN [id number] PCP:  Caleen Dirks, MD  Cardiologist:  Darryle ONEIDA Decent, MD  Electrophysiologist:  None   Chief Complaint: Follow up for shortness of breath   History of Present Illness: .    Delesa Kawa is a 89 y.o. female with visit-pertinent history of chronic HFpEF, permanent atrial fibrillation on Eliquis , AAA with possible penetrating ulcer, hypertension, breast cancer and anxiety.  She has been followed by Dr. Decent.  Previously lived in [facility] [place]  and was followed by cardiology practice there.  She moved to the area in 02/2019 and has been followed by Dr. Decent.  She was initially diagnosed with permanent atrial fibrillation in 2019 while living in [facility].  She underwent a cardioversion but this was unsuccessful and declined antiarrhythmics or repeat cardioversion following.  Echocardiogram in 07/2021 showed LVEF of 55 to 60% with normal wall motion and grade 3 diastolic dysfunction, mildly enlarged RV with normal systolic function and normal PASP, severe biatrial enlargement, mild MR and moderate TR.  At office visit in 11/2022 she reported dyspnea on exertion and low energy.  She also continued to lose weight, her nebivolol  was stopped as her heart rate was in the 40s to 50s and her torsemide  was decreased.  It was felt that her dyspnea was likely to be related to dehydration and low blood pressure with her compression fracture of thoracic vertebrae contributing as well.  In 12/2022 she was admitted following a fall.  She was found to have multiple fractures including a displaced posterior ninth and 10th right rib fractures and acute nondisplaced fractures of the right T8 and T9 transverse processes as well as a T12 compression fracture that had progressed from 08/2022.  She was admitted by general surgery and conservative therapy was recommended.  Hospitalization was complicated by hyponatremia with  sodium as low as 118.  It was felt this was likely secondary to volume overload as she was initially treated with IV fluids.  Her IV fluids were stopped and she was started on sodium tablets with free water restriction.  Her home torsemide  was briefly increased and she was advised to go back to usual twice weekly dosing.  Her spironolactone  was discontinued in setting of hyperkalemia.  On 03/09/2023 she notified the office of feeling more short of breath.  It was noted the patient had chronic dyspnea on exertion but felt this has been getting worse over the last 3 weeks.  She denies shortness of breath at rest, she sleeps at an incline this is more comfortable with her back problems.  She had some mild lower extremity edema but felt to be stable.  She denied any weight gain and stated she had actually been losing weight.  Patient reported that while her spironolactone  had been discontinued during his hospitalization she had continued taking at home.  Patient's chest x-ray was overall clear and her BNP was mildly elevated although improved compared to prior lab results at 271.  It was felt that her shortness of breath was due to deconditioning.  Today she presents for follow-up.  She reports that she has continued back pain from her fall earlier this year.  She feels that her breathing has improved from her prior visits.  She lives at an independent living facility, she tries to walk around her facility consistently, tolerating well.  She does feel as though she is deconditioned and is still trying  to recover from her fall earlier this year, this has overall been limited by her back pain.  She denies chest pain or palpitations, feels that her lower extremity edema is at baseline or even improved.    ROS: .   Today she denies chest pain, fatigue, palpitations, melena, hematuria, hemoptysis, diaphoresis, weakness, presyncope, syncope, orthopnea, and PND.  All other systems are reviewed and otherwise  negative. Studies Reviewed: SABRA    EKG:  EKG is ordered today, personally reviewed, demonstrating  EKG Interpretation Date/Time:  Friday June 19 2023 11:17:54 EST Ventricular Rate:  85 PR Interval:    QRS Duration:  84 QT Interval:  374 QTC Calculation: 445 R Axis:   -40  Text Interpretation: Atrial fibrillation Left axis deviation Low voltage QRS T wave inversions in inferior leads Possible Anterolateral infarct When compared with ECG of 18-Mar-2023 10:15, Left posterior fascicular block is no longer Present Confirmed by Andrik Sandt [phone]) on 06/19/2023 11:28:22 AM   CV Studies:  Cardiac Studies & Procedures      ECHOCARDIOGRAM  ECHOCARDIOGRAM COMPLETE 08/08/2021  Narrative ECHOCARDIOGRAM REPORT    Patient Name:   Reighlyn Elmes    Date of Exam: 08/08/2021 Medical Rec #:  [id number]     Height:       60.0 in Accession #:    [id number]    Weight:       168.0 lb Date of Birth:  1932-07-14      BSA:          1.733 m Patient Age:    89 years      BP:           140/86 mmHg Patient Gender: F             HR:           47 bpm. Exam Location:  [address]  Procedure: 2D Echo, Color Doppler and Cardiac Doppler  Indications:    R06.02 Shortness of Breath  History:        Patient has prior history of Echocardiogram examinations, most recent 02/28/2019. Abnormal ECG, Signs/Symptoms:Shortness of Breath; Risk Factors:Former Smoker, Family History of Coronary Artery Disease and Hypertension.  Sonographer:    Heather Hawks RDCS Referring Phys: DARRYLE NED O'NEAL  IMPRESSIONS   1. Left ventricular ejection fraction, by estimation, is 55 to 60%. The left ventricle has normal function. The left ventricle has no regional wall motion abnormalities. Left ventricular diastolic parameters are consistent with Grade III diastolic dysfunction (restrictive). Elevated left atrial pressure. 2. Right ventricular systolic function is normal. The right ventricular size is mildly enlarged. There is  normal pulmonary artery systolic pressure. 3. Left atrial size was severely dilated. 4. Right atrial size was severely dilated. 5. The mitral valve is normal in structure. Mild mitral valve regurgitation. No evidence of mitral stenosis. 6. Tricuspid valve regurgitation is moderate. 7. The aortic valve is normal in structure. Aortic valve regurgitation is not visualized. No aortic stenosis is present. 8. The inferior vena cava is normal in size with greater than 50% respiratory variability, suggesting right atrial pressure of 3 mmHg.  FINDINGS Left Ventricle: Left ventricular ejection fraction, by estimation, is 55 to 60%. The left ventricle has normal function. The left ventricle has no regional wall motion abnormalities. The left ventricular internal cavity size was normal in size. There is no left ventricular hypertrophy. Left ventricular diastolic parameters are consistent with Grade III diastolic dysfunction (restrictive). Elevated left atrial pressure.  Right Ventricle: The right ventricular size  is mildly enlarged. No increase in right ventricular wall thickness. Right ventricular systolic function is normal. There is normal pulmonary artery systolic pressure. The tricuspid regurgitant velocity is 2.50 m/s, and with an assumed right atrial pressure of 3 mmHg, the estimated right ventricular systolic pressure is 28.0 mmHg.  Left Atrium: Left atrial size was severely dilated.  Right Atrium: Right atrial size was severely dilated.  Pericardium: There is no evidence of pericardial effusion. Presence of epicardial fat layer.  Mitral Valve: The mitral valve is normal in structure. Mild mitral valve regurgitation. No evidence of mitral valve stenosis.  Tricuspid Valve: The tricuspid valve is normal in structure. Tricuspid valve regurgitation is moderate . No evidence of tricuspid stenosis.  Aortic Valve: The aortic valve is normal in structure. Aortic valve regurgitation is not visualized.  Aortic regurgitation PHT measures 327 msec. No aortic stenosis is present.  Pulmonic Valve: The pulmonic valve was normal in structure. Pulmonic valve regurgitation is trivial. No evidence of pulmonic stenosis.  Aorta: The aortic root is normal in size and structure.  Venous: The inferior vena cava is normal in size with greater than 50% respiratory variability, suggesting right atrial pressure of 3 mmHg.  IAS/Shunts: No atrial level shunt detected by color flow Doppler.   LEFT VENTRICLE PLAX 2D LVIDd:         4.90 cm   Diastology LVIDs:         3.40 cm   LV e' medial:    7.22 cm/s LV PW:         0.90 cm   LV E/e' medial:  21.6 LV IVS:        0.60 cm   LV e' lateral:   9.11 cm/s LVOT diam:     2.00 cm   LV E/e' lateral: 17.1 LV SV:         38 LV SV Index:   22 LVOT Area:     3.14 cm   RIGHT VENTRICLE RV S prime:     9.48 cm/s TAPSE (M-mode): 1.5 cm  LEFT ATRIUM              Index        RIGHT ATRIUM           Index LA diam:        4.20 cm  2.42 cm/m   RA Area:     35.10 cm LA Vol (A2C):   111.0 ml 64.04 ml/m  RA Volume:   128.00 ml 73.85 ml/m LA Vol (A4C):   110.0 ml 63.46 ml/m LA Biplane Vol: 112.0 ml 64.62 ml/m AORTIC VALVE LVOT Vmax:   57.90 cm/s LVOT Vmean:  35.700 cm/s LVOT VTI:    0.121 m AI PHT:      327 msec  AORTA Ao Root diam: 3.30 cm Ao Asc diam:  3.50 cm  MITRAL VALVE                TRICUSPID VALVE MV Area (PHT): cm          TR Peak grad:   25.0 mmHg MV Decel Time: 180 msec     TR Vmax:        250.00 cm/s MR Peak grad: 104.9 mmHg MR Mean grad: 64.0 mmHg     SHUNTS MR Vmax:      512.00 cm/s   Systemic VTI:  0.12 m MR Vmean:     373.0 cm/s    Systemic Diam: 2.00 cm MV E velocity: 155.60 cm/s  Kardie Tobb DO Electronically signed by Dub Huntsman DO Signature Date/Time: 08/08/2021/5:47:14 PM    Final             Current Reported Medications:.    Current Meds  Medication Sig   acetaminophen  (TYLENOL ) 500 MG tablet Take 500 mg by mouth every  6 (six) hours as needed for mild pain.   Cholecalciferol (VITAMIN D3) 25 MCG (1000 UT) CAPS Take by mouth. Take 1000 IU daily, unsure of dose   citalopram  (CELEXA ) 10 MG tablet 1 tablet Orally Once a day for 30 days   lidocaine  (LIDODERM ) 5 % Place 2 patches onto the skin daily. Remove & Discard patch within 12 hours or as directed by MD   polyethylene glycol (MIRALAX  / GLYCOLAX ) 17 g packet Take 17 g by mouth daily as needed.   spironolactone  (ALDACTONE ) 25 MG tablet TAKE 1 TABLET (25 MG TOTAL) BY MOUTH DAILY. (Patient taking differently: Take 12.5 mg by mouth daily.)   TIADYLT  ER 240 MG 24 hr capsule TAKE 1 CAPSULE BY MOUTH EVERY DAY   torsemide  (DEMADEX ) 20 MG tablet Take 1 tablet (20 mg total) by mouth daily as needed. Take as needed for weight gain on 3 lbs in 1 day or 5 lbs in 1 week   [DISCONTINUED] ELIQUIS  5 MG TABS tablet TAKE 1 TABLET BY MOUTH TWICE A DAY   Physical Exam:    VS:  BP 128/74 (BP Location: Left Arm, Patient Position: Sitting, Cuff Size: Normal)   Pulse 85   Ht 4' 11 (1.499 m)   Wt 138 lb (62.6 kg)   SpO2 96%   BMI 27.87 kg/m    Wt Readings from Last 3 Encounters:  06/19/23 138 lb (62.6 kg)  03/18/23 132 lb (59.9 kg)  12/11/22 142 lb 3.2 oz (64.5 kg)    GEN: Well nourished, well developed in no acute distress NECK: No JVD; No carotid bruits CARDIAC: irregular RR, no murmurs, rubs, gallops RESPIRATORY:  Clear to auscultation without rales, wheezing or rhonchi  ABDOMEN: Soft, non-tender, non-distended EXTREMITIES:  No edema; No acute deformity  Asessement and Plan:.    Shortness of breath: Patient with history of dyspnea previously felt to be from deconditioning.  At office visit in 03/2023 she reported worsening dyspnea on exertion.  BNP was mildly elevated however improved from patient's prior numbers.  Her chest x-ray was overall clear.  It was felt that her shortness of breath was related to deconditioning.  Today she reports that her shortness of breath has  improved, she takes her torsemide  only as needed, she has not required recently.  She is continued on spironolactone  12.5 mg daily.  Check BMET today.   Chronic HFpEF: Echocardiogram in 07/2021 indicated LVEF of 55 to 60% with normal wall motion and grade 3 diastolic dysfunction, mildly enlarged RV with normal systolic function and normal PASP, severe biatrial enlargement, mild MR and moderate TR. At office visit in 03/2023 she reported worsening dyspnea on exertion, BNP was mildly elevated at 271, however was lower than previous readings.  Was felt that her shortness of breath was related to deconditioning.  Today her shortness of breath has improved, she feels that her lower extremity edema is at baseline if not improved, mild lower extremity edema appreciated on exam.  Continue spironolactone  12.5 mg daily and torsemide  20 mg as needed for weight gain, pending BMET.   Permanent atrial fibrillation: Initially diagnosed with atrial fibrillation in 2019, failed a DCCV.  Patient declined antiarrhythmic or repeat  DCCV, has been on rate control.  Nebivolol  previously discontinued in setting of bradycardia. Heart rate today well controlled today at 85 bpm. CHA2DS2-VASc Score = 6 [CHF History: 1, HTN History: 1, Diabetes History: 0, Stroke History: 0, Vascular Disease History: 1, Age Score: 2, Gender Score: 1].  Therefore, the patient's annual risk of stroke is 9.7 %.  She denies bleeding problems on Eliquis . Continue Eliquis  5 mg twice daily and diltiazem  240 mg daily.  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  She has been evaluated by vascular surgery, declined surgical repair.  Followed by vascular surgery.  Hypertension: Blood pressure today 128/ 74. Continue diltiazem  and spironolactone , pending lab results.    Disposition: F/u with Dr. MALVATHEORA Mulch in three months.   Signed, Lasha Echeverria D Nicholl Onstott, NP

## 2023-06-19 ENCOUNTER — Ambulatory Visit: Payer: Medicare Other | Attending: Cardiology | Admitting: Cardiology

## 2023-06-19 ENCOUNTER — Encounter: Payer: Self-pay | Admitting: Cardiology

## 2023-06-19 VITALS — BP 128/74 | HR 85 | Ht 59.0 in | Wt 138.0 lb

## 2023-06-19 DIAGNOSIS — I714 Abdominal aortic aneurysm, without rupture, unspecified: Secondary | ICD-10-CM

## 2023-06-19 DIAGNOSIS — R0602 Shortness of breath: Secondary | ICD-10-CM

## 2023-06-19 DIAGNOSIS — I4821 Permanent atrial fibrillation: Secondary | ICD-10-CM | POA: Diagnosis not present

## 2023-06-19 DIAGNOSIS — I5032 Chronic diastolic (congestive) heart failure: Secondary | ICD-10-CM

## 2023-06-19 DIAGNOSIS — I10 Essential (primary) hypertension: Secondary | ICD-10-CM

## 2023-06-19 MED ORDER — APIXABAN 5 MG PO TABS: 5.0000 mg | ORAL_TABLET | Freq: Two times a day (BID) | ORAL | 3 refills | Status: AC

## 2023-06-19 NOTE — Patient Instructions (Signed)
 Medication Instructions:  No changes *If you need a refill on your cardiac medications before your next appointment, please call your pharmacy*  Lab Work: Today we will Draw Bmet If you have labs (blood work) drawn today and your tests are completely normal, you will receive your results only by: MyChart Message (if you have MyChart) OR A paper copy in the mail If you have any lab test that is abnormal or we need to change your treatment, we will call you to review the results.  Testing/Procedures: No testing  Follow-Up: At [facility], you and your health needs are our priority.  As part of our continuing mission to provide you with exceptional heart care, we have created designated Provider Care Teams.  These Care Teams include your primary Cardiologist (physician) and Advanced Practice Providers (APPs -  Physician Assistants and Nurse Practitioners) who all work together to provide you with the care you need, when you need it.  We recommend signing up for the patient portal called MyChart.  Sign up information is provided on this After Visit Summary.  MyChart is used to connect with patients for Virtual Visits (Telemedicine).  Patients are able to view lab/test results, encounter notes, upcoming appointments, etc.  Non-urgent messages can be sent to your provider as well.   To learn more about what you can do with MyChart, go to [email or web address].    Your next appointment:   3 month(s)  Provider:   Darryle ONEIDA Decent, MD

## 2023-06-20 LAB — BASIC METABOLIC PANEL
BUN/Creatinine Ratio: 22 (ref 12–28)
BUN: 14 mg/dL (ref 8–27)
CO2: 21 mmol/L (ref 20–29)
Calcium: 9.9 mg/dL (ref 8.7–10.3)
Chloride: 95 mmol/L — ABNORMAL LOW (ref 96–106)
Creatinine, Ser: 0.65 mg/dL (ref 0.57–1.00)
Glucose: 87 mg/dL (ref 70–99)
Potassium: 5.5 mmol/L — ABNORMAL HIGH (ref 3.5–5.2)
Sodium: 132 mmol/L — ABNORMAL LOW (ref 134–144)
eGFR: 85 mL/min/{1.73_m2} (ref >59)

## 2023-06-22 ENCOUNTER — Telehealth: Payer: Self-pay

## 2023-06-22 DIAGNOSIS — R0602 Shortness of breath: Secondary | ICD-10-CM

## 2023-06-22 DIAGNOSIS — I4821 Permanent atrial fibrillation: Secondary | ICD-10-CM

## 2023-06-22 NOTE — Telephone Encounter (Signed)
 Called patient advised of below they verbalized understanding.

## 2023-06-22 NOTE — Addendum Note (Signed)
 Addended by: Clotilde Dieter on: 06/22/2023 04:50 PM   Modules accepted: Orders

## 2023-06-22 NOTE — Telephone Encounter (Signed)
-----   Message from Katlyn D West sent at 06/22/2023  8:41 AM EST ----- Please let Ms. Jasmin know that her kidney function is normal but her potassium is elevated at 5.5. Recommend she stop spironolactone . Please ensure she is not taking any potassium supplements or consuming electrolyte beverages. She should decrease her dietary intake of high potassium foods including bananas, squash, yogurt, white beans, sweet potatoes, leafy greens, and avocados. Please repeat a BMET in one week.

## 2023-07-15 ENCOUNTER — Other Ambulatory Visit: Payer: Self-pay

## 2023-07-15 DIAGNOSIS — R0602 Shortness of breath: Secondary | ICD-10-CM

## 2023-07-15 DIAGNOSIS — I4821 Permanent atrial fibrillation: Secondary | ICD-10-CM

## 2023-07-20 DIAGNOSIS — H40013 Open angle with borderline findings, low risk, bilateral: Secondary | ICD-10-CM | POA: Diagnosis not present

## 2023-07-21 LAB — BASIC METABOLIC PANEL

## 2023-07-22 ENCOUNTER — Other Ambulatory Visit: Payer: Self-pay | Admitting: *Deleted

## 2023-07-22 ENCOUNTER — Telehealth: Payer: Self-pay | Admitting: Cardiovascular Disease

## 2023-07-22 DIAGNOSIS — I5032 Chronic diastolic (congestive) heart failure: Secondary | ICD-10-CM

## 2023-07-22 NOTE — Telephone Encounter (Signed)
 Patient called to follow-up on her lab results which shows online that the specimen was insufficient.  Patient wants to know next steps.

## 2023-07-22 NOTE — Telephone Encounter (Signed)
 Called pt and let her know to stay well hydrated and come back for additional lab draw. Pt verbalized understanding. She will return for additional lab draw. Pt was very upset.

## 2023-07-24 DIAGNOSIS — I5032 Chronic diastolic (congestive) heart failure: Secondary | ICD-10-CM | POA: Diagnosis not present

## 2023-07-25 LAB — BASIC METABOLIC PANEL
BUN/Creatinine Ratio: 20 (ref 12–28)
BUN: 14 mg/dL (ref 8–27)
CO2: 24 mmol/L (ref 20–29)
Calcium: 10 mg/dL (ref 8.7–10.3)
Chloride: 98 mmol/L (ref 96–106)
Creatinine, Ser: 0.69 mg/dL (ref 0.57–1.00)
Glucose: 116 mg/dL — ABNORMAL HIGH (ref 70–99)
Potassium: 4.4 mmol/L (ref 3.5–5.2)
Sodium: 135 mmol/L (ref 134–144)
eGFR: 83 mL/min/{1.73_m2} (ref >59)

## 2023-07-27 ENCOUNTER — Telehealth: Payer: Self-pay

## 2023-07-27 NOTE — Telephone Encounter (Signed)
-----   Message from Katlyn D West sent at 07/26/2023 12:16 PM EST ----- Please let [name] know that her kidney function and electrolytes are normal. Good results! Continue current medications and follow up as planned.

## 2023-07-27 NOTE — Telephone Encounter (Signed)
 Called patient advised of below they verbalized understanding.

## 2023-09-08 DIAGNOSIS — L821 Other seborrheic keratosis: Secondary | ICD-10-CM | POA: Diagnosis not present

## 2023-09-08 DIAGNOSIS — L853 Xerosis cutis: Secondary | ICD-10-CM | POA: Diagnosis not present

## 2023-09-08 DIAGNOSIS — L820 Inflamed seborrheic keratosis: Secondary | ICD-10-CM | POA: Diagnosis not present

## 2023-09-08 DIAGNOSIS — L814 Other melanin hyperpigmentation: Secondary | ICD-10-CM | POA: Diagnosis not present

## 2023-09-21 ENCOUNTER — Telehealth: Payer: Self-pay | Admitting: Cardiovascular Disease

## 2023-09-21 NOTE — Telephone Encounter (Signed)
 Pt c/o BP issue: STAT if pt c/o blurred vision, one-sided weakness or slurred speech  1. What are your last 5 BP readings? 157/123 at 7:15am, then took diltiazem and it was then 145/110 at 8:15am.   2. Are you having any other symptoms (ex. Dizziness, headache, blurred vision, passed out)? Just body aches  3. What is your BP issue? Pt stated she'd like to speak with nurse because she doesn't know what to do. Please advise.

## 2023-09-21 NOTE — Telephone Encounter (Signed)
 Sande Rives, MD  You; Shade Flood R, RN8 minutes ago (10:39 AM)    Keep BP log. Twice per day. We will discuss on 4/10. Continue current medications until then.  Gerri Spore T. Flora Lipps, MD, [facility]  [facility] [address], [address] [place], [place] [address] [phone] 10:39 AM   Patient identification verified by 2 forms. Marilynn Rail, RN    Called and spoke to patient  Relayed provider message  Reviewed ED warning signs/precautions  Patient verbalized understanding, no questions at this time

## 2023-09-21 NOTE — Telephone Encounter (Signed)
 Patient identification verified by 2 forms. Marilynn Rail, RN    Called and spoke to patient  Patient states:   -Concerned about BP   -DBP continues to increase   -Takes diltiazem 240 mg daily   -Takes Torsemide 20 mg as needed for swelling -At 7:15am today  BP was 157/123 -At 8:15 am today 1 hour after diltiazem BP: 145/110 -At time of call BP: 142/112  HR: 92  -she has some worsening SOB  -she is feeling very anxious  -she is aware of 4/10 OV  Patient denies:   -headaches    -chest pain   -SOB/difficulty breathing  Informed patient message sent to Dr. Flora Lipps for input  Patient verbalized understanding, no questions at this time

## 2023-09-23 NOTE — Progress Notes (Unsigned)
  Cardiology Office Note:  .   Date:  09/23/2023  ID:  Whitney Burton, DOB 26-Feb-1933, MRN [id number] PCP: Deatra James, MD  [facility] Providers Cardiologist:  Reatha Harps, MD { Click to update primary MD,subspecialty MD or APP then REFRESH:1}   History of Present Illness: .   No chief complaint on file.   Whitney Burton is a 89 y.o. female with history of permanent Afib, HTN, HFpEF who presents for follow-up.      Problem List 1. Permanent Atrial Fibrillation  -diagnosed 2019 [facility] -failed DCCV and did not want to do rhythm medications  -CHADSVASC= 4 (age, sex, HTN) 2. Hypertension 3. HFpEF  -BNP 451 -EF 60-65% 4. AAA -31 mm 07/31/2022 -penetrating aortic ulcer?    ROS: All other ROS reviewed and negative. Pertinent positives noted in the HPI.     Studies Reviewed: Marland Kitchen       TTE 08/08/2021  1. Left ventricular ejection fraction, by estimation, is 55 to 60%. The  left ventricle has normal function. The left ventricle has no regional  wall motion abnormalities. Left ventricular diastolic parameters are  consistent with Grade III diastolic  dysfunction (restrictive). Elevated left atrial pressure.   2. Right ventricular systolic function is normal. The right ventricular  size is mildly enlarged. There is normal pulmonary artery systolic  pressure.   3. Left atrial size was severely dilated.   4. Right atrial size was severely dilated.   5. The mitral valve is normal in structure. Mild mitral valve  regurgitation. No evidence of mitral stenosis.   6. Tricuspid valve regurgitation is moderate.   7. The aortic valve is normal in structure. Aortic valve regurgitation is  not visualized. No aortic stenosis is present.   8. The inferior vena cava is normal in size with greater than 50%  respiratory variability, suggesting right atrial pressure of 3 mmHg.  Physical Exam:   VS:  There were no vitals taken for this visit.   Wt Readings from Last 3 Encounters:   06/19/23 138 lb (62.6 kg)  03/18/23 132 lb (59.9 kg)  12/11/22 142 lb 3.2 oz (64.5 kg)    GEN: Well nourished, well developed in no acute distress NECK: No JVD; No carotid bruits CARDIAC: ***RRR, no murmurs, rubs, gallops RESPIRATORY:  Clear to auscultation without rales, wheezing or rhonchi  ABDOMEN: Soft, non-tender, non-distended EXTREMITIES:  No edema; No deformity  ASSESSMENT AND PLAN: .   ***    {Are you ordering a CV Procedure (e.g. stress test, cath, DCCV, TEE, etc)?   Press F2        :[id number]}   Follow-up: No follow-ups on file.  Time Spent with Patient: I have spent a total of *** minutes caring for this patient today face to face, ordering and reviewing labs/tests, reviewing prior records/medical history, examining the patient, establishing an assessment and plan, communicating results/findings to the patient/family, and documenting in the medical record.   Signed, Lenna Gilford. Flora Lipps, MD, [facility]  [facility]  [address] [place], [place] [address] [phone]  9:19 AM

## 2023-09-24 ENCOUNTER — Ambulatory Visit: Payer: Medicare Other | Attending: Cardiovascular Disease | Admitting: Cardiovascular Disease

## 2023-09-24 ENCOUNTER — Encounter: Payer: Self-pay | Admitting: Cardiovascular Disease

## 2023-09-24 VITALS — BP 132/70 | HR 101 | Ht 59.0 in | Wt 142.0 lb

## 2023-09-24 DIAGNOSIS — R0602 Shortness of breath: Secondary | ICD-10-CM | POA: Diagnosis not present

## 2023-09-24 DIAGNOSIS — I5032 Chronic diastolic (congestive) heart failure: Secondary | ICD-10-CM | POA: Diagnosis not present

## 2023-09-24 DIAGNOSIS — I4821 Permanent atrial fibrillation: Secondary | ICD-10-CM

## 2023-09-24 DIAGNOSIS — I10 Essential (primary) hypertension: Secondary | ICD-10-CM

## 2023-09-24 DIAGNOSIS — I714 Abdominal aortic aneurysm, without rupture, unspecified: Secondary | ICD-10-CM | POA: Diagnosis not present

## 2023-09-24 DIAGNOSIS — R5381 Other malaise: Secondary | ICD-10-CM | POA: Diagnosis not present

## 2023-09-24 MED ORDER — TORSEMIDE 20 MG PO TABS: 20.0000 mg | ORAL_TABLET | Freq: Every day | ORAL | Status: DC | PRN

## 2023-09-24 MED ORDER — POTASSIUM CHLORIDE CRYS ER 10 MEQ PO TBCR: 10.0000 meq | EXTENDED_RELEASE_TABLET | ORAL | 5 refills | Status: DC

## 2023-09-24 NOTE — Patient Instructions (Signed)
 Medication Instructions:  Your physician recommends that you continue on your current medications as directed. Please refer to the Current Medication list given to you today.    *If you need a refill on your cardiac medications before your next appointment, please call your pharmacy*   Lab Work: NONE    If you have labs (blood work) drawn today and your tests are completely normal, you will receive your results only by: MyChart Message (if you have MyChart) OR A paper copy in the mail If you have any lab test that is abnormal or we need to change your treatment, we will call you to review the results.   Testing/Procedures: NONE    Follow-Up: At [facility], you and your health needs are our priority.  As part of our continuing mission to provide you with exceptional heart care, we have created designated Provider Care Teams.  These Care Teams include your primary Cardiologist (physician) and Advanced Practice Providers (APPs -  Physician Assistants and Nurse Practitioners) who all work together to provide you with the care you need, when you need it.  We recommend signing up for the patient portal called "MyChart".  Sign up information is provided on this After Visit Summary.  MyChart is used to connect with patients for Virtual Visits (Telemedicine).  Patients are able to view lab/test results, encounter notes, upcoming appointments, etc.  Non-urgent messages can be sent to your provider as well.   To learn more about what you can do with MyChart, go to [email or web address].    Your next appointment:   4 month(s)  The format for your next appointment:   In Person  Provider:   Reatha Harps, MD    Other Instructions

## 2023-09-28 IMAGING — CR DG CHEST 2V
3 series · 3 of 3 positions shown · non-contrast
Comparison: None.

CLINICAL DATA: Short of breath for years.  Cough.

EXAM:
CHEST - 2 VIEW

[w chest pa]
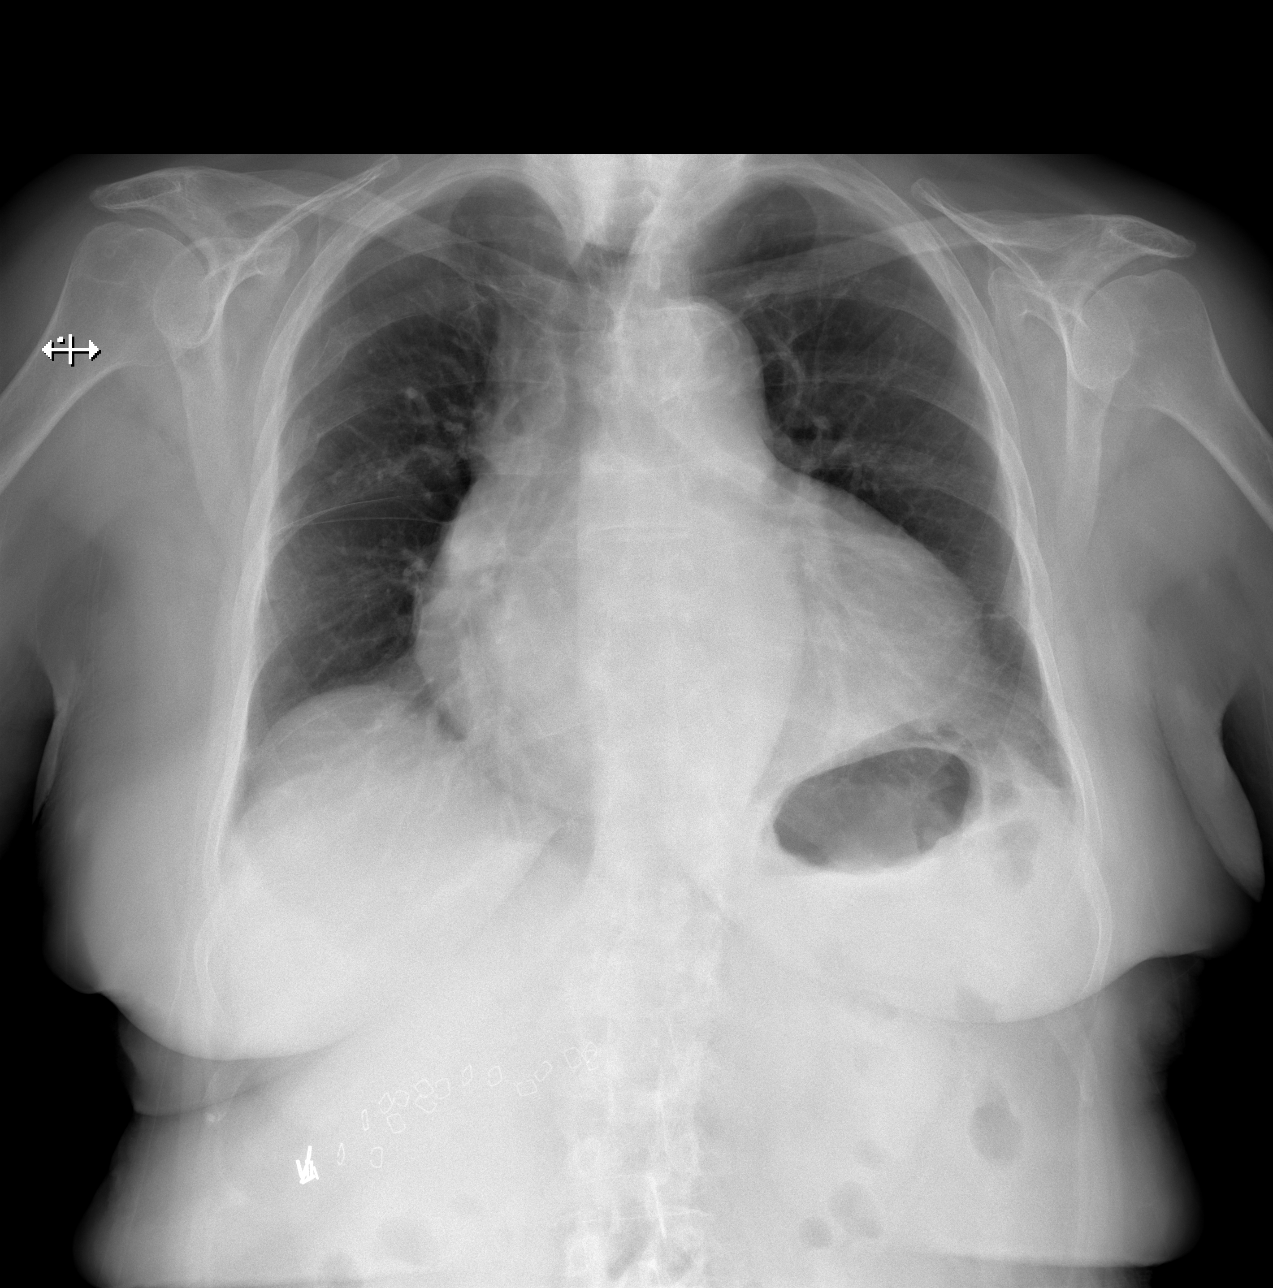

[w chest lat (1 of 2)]
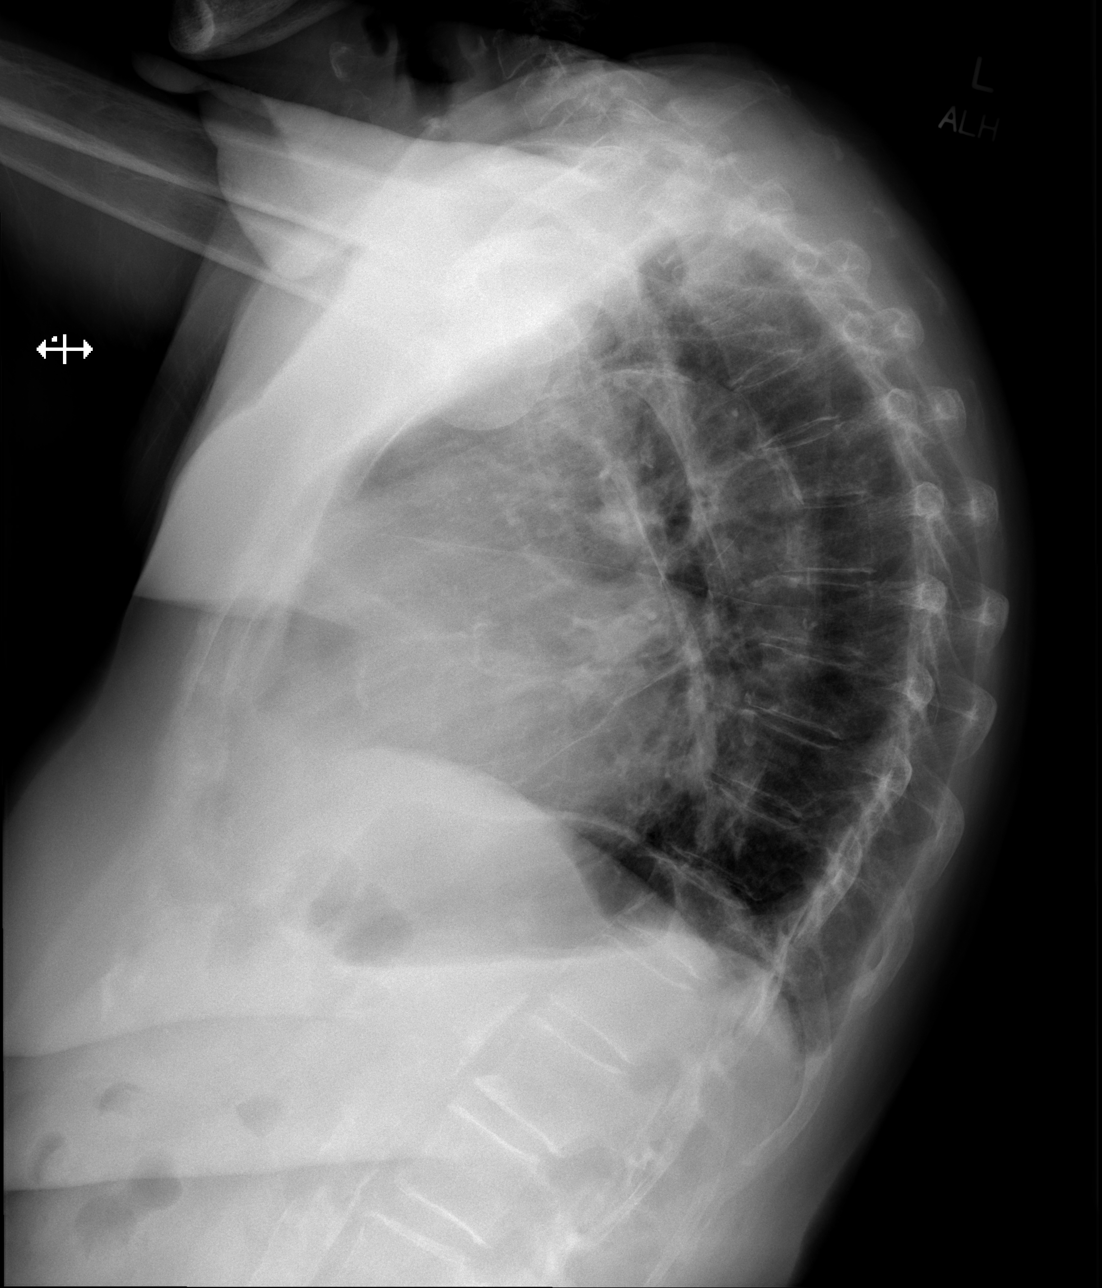

[w chest lat (2 of 2)]
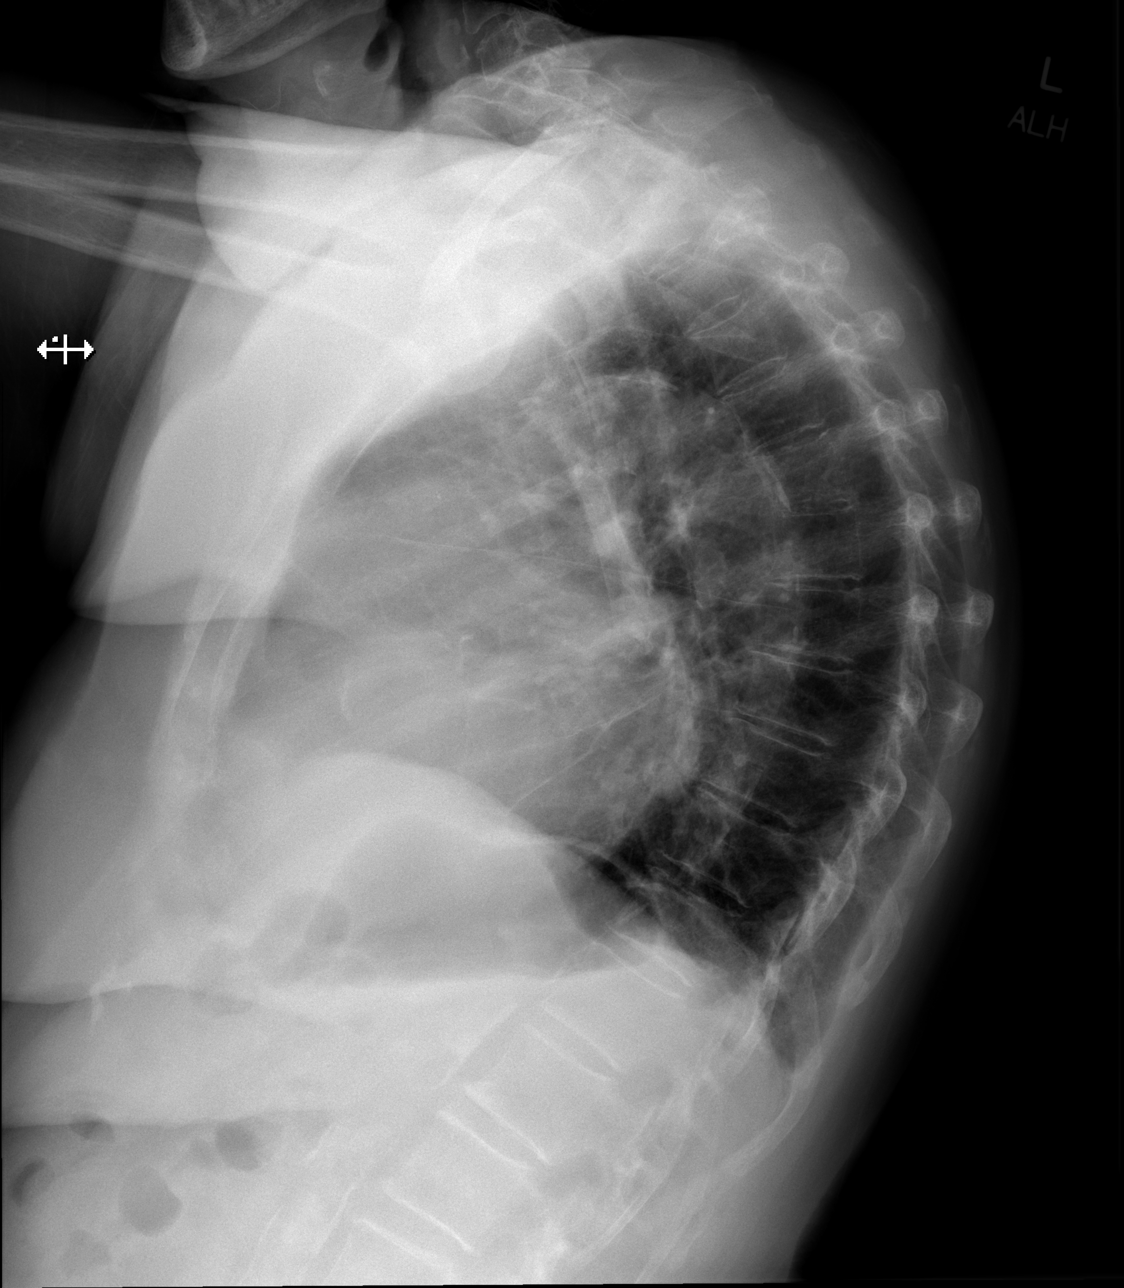

[3 of 3 positions shown; findings below may reference images not displayed]

FINDINGS: Cardiac silhouette is moderately enlarged. No mediastinal or hilar
masses. No adenopathy.

Clear lungs.  No pleural effusion or pneumothorax.

Skeletal structures are intact.
IMPRESSION: 1. No acute cardiopulmonary disease.
2. Cardiomegaly.

## 2023-10-06 DIAGNOSIS — L821 Other seborrheic keratosis: Secondary | ICD-10-CM | POA: Diagnosis not present

## 2023-10-06 DIAGNOSIS — L814 Other melanin hyperpigmentation: Secondary | ICD-10-CM | POA: Diagnosis not present

## 2023-10-06 DIAGNOSIS — L820 Inflamed seborrheic keratosis: Secondary | ICD-10-CM | POA: Diagnosis not present

## 2023-11-13 DIAGNOSIS — I7143 Infrarenal abdominal aortic aneurysm, without rupture: Secondary | ICD-10-CM | POA: Diagnosis not present

## 2023-11-13 DIAGNOSIS — G2581 Restless legs syndrome: Secondary | ICD-10-CM | POA: Diagnosis not present

## 2023-11-13 DIAGNOSIS — M810 Age-related osteoporosis without current pathological fracture: Secondary | ICD-10-CM | POA: Diagnosis not present

## 2023-11-13 DIAGNOSIS — M25561 Pain in right knee: Secondary | ICD-10-CM | POA: Diagnosis not present

## 2023-11-13 DIAGNOSIS — I4891 Unspecified atrial fibrillation: Secondary | ICD-10-CM | POA: Diagnosis not present

## 2023-11-13 DIAGNOSIS — R0609 Other forms of dyspnea: Secondary | ICD-10-CM | POA: Diagnosis not present

## 2023-11-21 ENCOUNTER — Encounter (HOSPITAL_BASED_OUTPATIENT_CLINIC_OR_DEPARTMENT_OTHER): Payer: Self-pay

## 2023-11-21 ENCOUNTER — Emergency Department (HOSPITAL_BASED_OUTPATIENT_CLINIC_OR_DEPARTMENT_OTHER)

## 2023-11-21 ENCOUNTER — Inpatient Hospital Stay (HOSPITAL_BASED_OUTPATIENT_CLINIC_OR_DEPARTMENT_OTHER)
Admission: EM | Admit: 2023-11-21 | Discharge: 2023-11-28 | DRG: 291 | Disposition: A | Discharge status: Home Health | Attending: Internal Medicine | Admitting: Internal Medicine

## 2023-11-21 ENCOUNTER — Other Ambulatory Visit: Payer: Self-pay

## 2023-11-21 DIAGNOSIS — I10 Essential (primary) hypertension: Secondary | ICD-10-CM | POA: Diagnosis not present

## 2023-11-21 DIAGNOSIS — M542 Cervicalgia: Secondary | ICD-10-CM | POA: Diagnosis not present

## 2023-11-21 DIAGNOSIS — I771 Stricture of artery: Secondary | ICD-10-CM | POA: Diagnosis not present

## 2023-11-21 DIAGNOSIS — I4821 Permanent atrial fibrillation: Secondary | ICD-10-CM | POA: Diagnosis present

## 2023-11-21 DIAGNOSIS — F32A Depression, unspecified: Secondary | ICD-10-CM

## 2023-11-21 DIAGNOSIS — I5031 Acute diastolic (congestive) heart failure: Secondary | ICD-10-CM | POA: Diagnosis not present

## 2023-11-21 DIAGNOSIS — I5021 Acute systolic (congestive) heart failure: Secondary | ICD-10-CM | POA: Diagnosis not present

## 2023-11-21 DIAGNOSIS — Z79899 Other long term (current) drug therapy: Secondary | ICD-10-CM

## 2023-11-21 DIAGNOSIS — I4891 Unspecified atrial fibrillation: Secondary | ICD-10-CM | POA: Diagnosis not present

## 2023-11-21 DIAGNOSIS — E8770 Fluid overload, unspecified: Secondary | ICD-10-CM | POA: Diagnosis not present

## 2023-11-21 DIAGNOSIS — Z800 Family history of malignant neoplasm of digestive organs: Secondary | ICD-10-CM

## 2023-11-21 DIAGNOSIS — Z853 Personal history of malignant neoplasm of breast: Secondary | ICD-10-CM

## 2023-11-21 DIAGNOSIS — I959 Hypotension, unspecified: Secondary | ICD-10-CM | POA: Diagnosis not present

## 2023-11-21 DIAGNOSIS — I700 Atherosclerosis of aorta: Secondary | ICD-10-CM | POA: Diagnosis not present

## 2023-11-21 DIAGNOSIS — Z87891 Personal history of nicotine dependence: Secondary | ICD-10-CM

## 2023-11-21 DIAGNOSIS — Z881 Allergy status to other antibiotic agents status: Secondary | ICD-10-CM

## 2023-11-21 DIAGNOSIS — Z8249 Family history of ischemic heart disease and other diseases of the circulatory system: Secondary | ICD-10-CM | POA: Diagnosis not present

## 2023-11-21 DIAGNOSIS — R54 Age-related physical debility: Secondary | ICD-10-CM | POA: Diagnosis not present

## 2023-11-21 DIAGNOSIS — I5043 Acute on chronic combined systolic (congestive) and diastolic (congestive) heart failure: Secondary | ICD-10-CM | POA: Diagnosis present

## 2023-11-21 DIAGNOSIS — Z602 Problems related to living alone: Secondary | ICD-10-CM | POA: Diagnosis present

## 2023-11-21 DIAGNOSIS — R001 Bradycardia, unspecified: Secondary | ICD-10-CM | POA: Diagnosis not present

## 2023-11-21 DIAGNOSIS — E876 Hypokalemia: Secondary | ICD-10-CM | POA: Diagnosis not present

## 2023-11-21 DIAGNOSIS — I110 Hypertensive heart disease with heart failure: Principal | ICD-10-CM | POA: Diagnosis present

## 2023-11-21 DIAGNOSIS — Z7901 Long term (current) use of anticoagulants: Secondary | ICD-10-CM

## 2023-11-21 DIAGNOSIS — Z888 Allergy status to other drugs, medicaments and biological substances status: Secondary | ICD-10-CM | POA: Diagnosis not present

## 2023-11-21 DIAGNOSIS — E871 Hypo-osmolality and hyponatremia: Secondary | ICD-10-CM | POA: Diagnosis not present

## 2023-11-21 DIAGNOSIS — I5023 Acute on chronic systolic (congestive) heart failure: Secondary | ICD-10-CM | POA: Diagnosis not present

## 2023-11-21 DIAGNOSIS — Z90711 Acquired absence of uterus with remaining cervical stump: Secondary | ICD-10-CM | POA: Diagnosis not present

## 2023-11-21 DIAGNOSIS — T502X5A Adverse effect of carbonic-anhydrase inhibitors, benzothiadiazides and other diuretics, initial encounter: Secondary | ICD-10-CM | POA: Diagnosis not present

## 2023-11-21 DIAGNOSIS — I714 Abdominal aortic aneurysm, without rupture, unspecified: Secondary | ICD-10-CM | POA: Diagnosis present

## 2023-11-21 DIAGNOSIS — I3139 Other pericardial effusion (noninflammatory): Secondary | ICD-10-CM | POA: Diagnosis present

## 2023-11-21 DIAGNOSIS — R0602 Shortness of breath: Secondary | ICD-10-CM

## 2023-11-21 DIAGNOSIS — I493 Ventricular premature depolarization: Secondary | ICD-10-CM | POA: Diagnosis not present

## 2023-11-21 DIAGNOSIS — I5033 Acute on chronic diastolic (congestive) heart failure: Secondary | ICD-10-CM | POA: Diagnosis not present

## 2023-11-21 DIAGNOSIS — I509 Heart failure, unspecified: Secondary | ICD-10-CM

## 2023-11-21 DIAGNOSIS — I071 Rheumatic tricuspid insufficiency: Secondary | ICD-10-CM | POA: Diagnosis present

## 2023-11-21 DIAGNOSIS — S2241XA Multiple fractures of ribs, right side, initial encounter for closed fracture: Secondary | ICD-10-CM | POA: Diagnosis not present

## 2023-11-21 DIAGNOSIS — R079 Chest pain, unspecified: Secondary | ICD-10-CM | POA: Diagnosis not present

## 2023-11-21 LAB — COMPREHENSIVE METABOLIC PANEL WITH GFR
ALT: 13 U/L (ref 0–44)
AST: 20 U/L (ref 15–41)
Albumin: 4.4 g/dL (ref 3.5–5.0)
Alkaline Phosphatase: 87 U/L (ref 38–126)
Anion gap: 14 (ref 5–15)
BUN: 17 mg/dL (ref 8–23)
CO2: 24 mmol/L (ref 22–32)
Calcium: 10 mg/dL (ref 8.9–10.3)
Chloride: 98 mmol/L (ref 98–111)
Creatinine, Ser: 0.66 mg/dL (ref 0.44–1.00)
GFR, Estimated: >60 mL/min (ref >60)
Glucose, Bld: 100 mg/dL — ABNORMAL HIGH (ref 70–99)
Potassium: 3.9 mmol/L (ref 3.5–5.1)
Sodium: 136 mmol/L (ref 135–145)
Total Bilirubin: 0.6 mg/dL (ref 0.0–1.2)
Total Protein: 7.3 g/dL (ref 6.5–8.1)

## 2023-11-21 LAB — CBC WITH DIFFERENTIAL/PLATELET
Abs Immature Granulocytes: 0.03 10*3/uL (ref 0.00–0.07)
Basophils Absolute: 0 10*3/uL (ref 0.0–0.1)
Basophils Relative: 1 %
Eosinophils Absolute: 0 10*3/uL (ref 0.0–0.5)
Eosinophils Relative: 0 %
HCT: 45.1 % (ref 36.0–46.0)
Hemoglobin: 15.6 g/dL — ABNORMAL HIGH (ref 12.0–15.0)
Immature Granulocytes: 0 %
Lymphocytes Relative: 27 %
Lymphs Abs: 2.2 10*3/uL (ref 0.7–4.0)
MCH: 33 pg (ref 26.0–34.0)
MCHC: 34.6 g/dL (ref 30.0–36.0)
MCV: 95.3 fL (ref 80.0–100.0)
Monocytes Absolute: 0.7 10*3/uL (ref 0.1–1.0)
Monocytes Relative: 9 %
Neutro Abs: 5.1 10*3/uL (ref 1.7–7.7)
Neutrophils Relative %: 63 %
Platelets: 296 10*3/uL (ref 150–400)
RBC: 4.73 MIL/uL (ref 3.87–5.11)
RDW: 15.3 % (ref 11.5–15.5)
WBC: 8 10*3/uL (ref 4.0–10.5)
nRBC: 0 % (ref 0.0–0.2)

## 2023-11-21 LAB — TROPONIN T, HIGH SENSITIVITY
Troponin T High Sensitivity: <15 ng/L (ref <19)
Troponin T High Sensitivity: <15 ng/L (ref <19)

## 2023-11-21 LAB — PRO BRAIN NATRIURETIC PEPTIDE: Pro Brain Natriuretic Peptide: 3177 pg/mL — ABNORMAL HIGH (ref <300.0)

## 2023-11-21 MED ORDER — PROCHLORPERAZINE EDISYLATE 10 MG/2ML IJ SOLN: 10.0000 mg | Freq: Four times a day (QID) | INTRAMUSCULAR | Status: DC | PRN

## 2023-11-21 MED ORDER — SODIUM CHLORIDE 0.9 % IV SOLN: 250.0000 mL | INTRAVENOUS | Status: AC | PRN

## 2023-11-21 MED ORDER — LIDOCAINE 5 % EX PTCH
1.0000 | MEDICATED_PATCH | CUTANEOUS | Status: DC
  Administered 2023-11-21: 1 via TRANSDERMAL
  Filled 2023-11-21 (×3): qty 1

## 2023-11-21 MED ORDER — APIXABAN 5 MG PO TABS
5.0000 mg | ORAL_TABLET | Freq: Two times a day (BID) | ORAL | Status: DC
  Administered 2023-11-21 – 2023-11-26 (×11): 5 mg via ORAL
  Filled 2023-11-21 (×12): qty 1
  Filled 2023-11-21: qty 2
  Filled 2023-11-21 (×2): qty 1

## 2023-11-21 MED ORDER — FUROSEMIDE 10 MG/ML IJ SOLN
60.0000 mg | Freq: Once | INTRAMUSCULAR | Status: DC
  Filled 2023-11-21: qty 6

## 2023-11-21 MED ORDER — ACETAMINOPHEN 500 MG PO TABS
1000.0000 mg | ORAL_TABLET | Freq: Once | ORAL | Status: AC
  Administered 2023-11-21: 1000 mg via ORAL
  Filled 2023-11-21: qty 2

## 2023-11-21 MED ORDER — FUROSEMIDE 10 MG/ML IJ SOLN
60.0000 mg | Freq: Once | INTRAMUSCULAR | Status: AC
  Administered 2023-11-21: 60 mg via INTRAVENOUS

## 2023-11-21 MED ORDER — FUROSEMIDE 10 MG/ML IJ SOLN
INTRAMUSCULAR | Status: AC
  Filled 2023-11-21: qty 2

## 2023-11-21 MED ORDER — FUROSEMIDE 10 MG/ML IJ SOLN
40.0000 mg | Freq: Two times a day (BID) | INTRAMUSCULAR | Status: DC
  Administered 2023-11-21 – 2023-11-22 (×2): 40 mg via INTRAVENOUS
  Filled 2023-11-21 (×2): qty 4

## 2023-11-21 MED ORDER — CITALOPRAM HYDROBROMIDE 20 MG PO TABS
20.0000 mg | ORAL_TABLET | Freq: Every day | ORAL | Status: DC
  Administered 2023-11-21 – 2023-11-26 (×6): 20 mg via ORAL
  Filled 2023-11-21 (×8): qty 1

## 2023-11-21 MED ORDER — SODIUM CHLORIDE 0.9% FLUSH: 3.0000 mL | INTRAVENOUS | Status: DC | PRN

## 2023-11-21 MED ORDER — ACETAMINOPHEN 325 MG PO TABS
650.0000 mg | ORAL_TABLET | ORAL | Status: DC | PRN
  Administered 2023-11-22 – 2023-11-26 (×7): 650 mg via ORAL
  Filled 2023-11-21 (×8): qty 2

## 2023-11-21 MED ORDER — DILTIAZEM HCL ER COATED BEADS 120 MG PO CP24
240.0000 mg | ORAL_CAPSULE | Freq: Every day | ORAL | Status: DC
  Administered 2023-11-22: 240 mg via ORAL
  Filled 2023-11-21: qty 2

## 2023-11-21 MED ORDER — DILTIAZEM HCL ER BEADS 240 MG PO CP24: 240.0000 mg | ORAL_CAPSULE | Freq: Every day | ORAL | Status: DC

## 2023-11-21 MED ORDER — POLYETHYLENE GLYCOL 3350 17 G PO PACK: 17.0000 g | PACK | Freq: Every day | ORAL | Status: DC | PRN

## 2023-11-21 MED ORDER — BUMETANIDE 0.25 MG/ML IJ SOLN: 0.5000 mg | Freq: Once | INTRAMUSCULAR | Status: DC

## 2023-11-21 MED ORDER — POTASSIUM CHLORIDE CRYS ER 20 MEQ PO TBCR
20.0000 meq | EXTENDED_RELEASE_TABLET | Freq: Two times a day (BID) | ORAL | Status: DC
  Administered 2023-11-21: 20 meq via ORAL
  Filled 2023-11-21: qty 1

## 2023-11-21 MED ORDER — SODIUM CHLORIDE 0.9% FLUSH
3.0000 mL | Freq: Two times a day (BID) | INTRAVENOUS | Status: DC
  Administered 2023-11-21 – 2023-11-26 (×11): 3 mL via INTRAVENOUS

## 2023-11-21 NOTE — ED Triage Notes (Signed)
 SOB x1 week. Elevated BP. PMH A-fib. Takes Eliquis  and diltiazem .

## 2023-11-21 NOTE — ED Provider Notes (Addendum)
 [place] EMERGENCY DEPARTMENT AT [facility] Provider Note   CSN: [id number] Arrival date & time: 11/21/23  1257     History  Chief Complaint  Patient presents with   Shortness of Breath   Hypertension    Whitney Burton is a 89 y.o. female.  Patient here with shortness of breath.  History of heart failure, hypertension A-fib on Eliquis .  She has been intermittently taking her torsemide  as prescribed.  She has not noticed any major weight gain.  Noted some leg swelling.  Shortness of breath with exertion when laying flat.  She denies any cough or sputum production.  No specific chest pain.  She denies any fever.  Denies any abdominal pain nausea vomiting diarrhea.  No headache.  She does blood pressure high at times.  Denies any weakness numbness tingling.  The history is provided by the patient.       Home Medications Prior to Admission medications   Medication Sig Start Date End Date Taking? Authorizing Provider  apixaban  (ELIQUIS ) 5 MG TABS tablet Take 1 tablet (5 mg total) by mouth 2 (two) times daily. 06/19/23  Yes West, Katlyn D, NP  Cholecalciferol (VITAMIN D3) 25 MCG (1000 UT) CAPS Take by mouth. Take 1000 IU daily, unsure of dose   Yes [provider]  citalopram  (CELEXA ) 10 MG tablet 1 tablet Orally Once a day for 30 days Patient taking differently: Take 20 mg by mouth daily. Increased to 20 mg daily. 10/22/22  Yes Wilhelmena Hanson, FNP  TIADYLT  ER 240 MG 24 hr capsule TAKE 1 CAPSULE BY MOUTH EVERY DAY 04/20/23  Yes O'Neal, Cathay Clonts, MD  torsemide  (DEMADEX ) 20 MG tablet Take 1 tablet (20 mg total) by mouth daily as needed. Take as needed for weight gain on 3 lbs in 1 day or 5 lbs in 1 week 09/24/23  Yes O'Neal, Cathay Clonts, MD  acetaminophen  (TYLENOL ) 500 MG tablet Take 500 mg by mouth every 6 (six) hours as needed for mild pain.    [provider]  feeding supplement (ENSURE ENLIVE / ENSURE PLUS) LIQD Take 237 mLs by mouth 3 (three) times daily  between meals. 12/30/22   Kraig Peru, MD  lidocaine  (LIDODERM ) 5 % Place 2 patches onto the skin daily. Remove & Discard patch within 12 hours or as directed by MD 12/31/22   Maczis, Michael M, PA-C  polyethylene glycol (MIRALAX  / GLYCOLAX ) 17 g packet Take 17 g by mouth daily as needed. 12/31/22   Maczis, Michael M, PA-C  potassium chloride  (KLOR-CON  M) 10 MEQ tablet Take 1 tablet (10 mEq total) by mouth as directed. Take with torsemide . 09/24/23   O'Neal, Cathay Clonts, MD      Allergies    Ciprofloxacin hcl, Levofloxacin, Ondansetron  hcl, and Furosemide     Review of Systems   Review of Systems  Physical Exam Updated Vital Signs BP (!) 141/90   Pulse 84   Temp 98 F (36.7 C) (Oral)   Resp (!) 36   SpO2 95%  Physical Exam Vitals and nursing note reviewed.  Constitutional:      General: She is not in acute distress.    Appearance: She is well-developed.  HENT:     Head: Normocephalic and atraumatic.     Mouth/Throat:     Mouth: Mucous membranes are moist.  Eyes:     Extraocular Movements: Extraocular movements intact.     Conjunctiva/sclera: Conjunctivae normal.     Pupils: Pupils are equal, round, and reactive to  light.  Cardiovascular:     Rate and Rhythm: Normal rate and regular rhythm.     Pulses: Normal pulses.     Heart sounds: Normal heart sounds. No murmur heard. Pulmonary:     Effort: Pulmonary effort is normal. No respiratory distress.     Breath sounds: Decreased breath sounds present.  Abdominal:     Palpations: Abdomen is soft.     Tenderness: There is no abdominal tenderness.  Musculoskeletal:        General: No swelling. Normal range of motion.     Cervical back: Normal range of motion and neck supple.     Right lower leg: Edema present.     Left lower leg: Edema present.  Skin:    General: Skin is warm and dry.     Capillary Refill: Capillary refill takes less than 2 seconds.  Neurological:     General: No focal deficit present.     Mental Status:  She is alert.  Psychiatric:        Mood and Affect: Mood normal.     ED Results / Procedures / Treatments   Labs (all labs ordered are listed, but only abnormal results are displayed) Labs Reviewed  CBC WITH DIFFERENTIAL/PLATELET - Abnormal; Notable for the following components:      Result Value   Hemoglobin 15.6 (*)    All other components within normal limits  COMPREHENSIVE METABOLIC PANEL WITH GFR - Abnormal; Notable for the following components:   Glucose, Bld 100 (*)    All other components within normal limits  PRO BRAIN NATRIURETIC PEPTIDE - Abnormal; Notable for the following components:   Pro Brain Natriuretic Peptide 3,177.0 (*)    All other components within normal limits  TROPONIN T, HIGH SENSITIVITY  TROPONIN T, HIGH SENSITIVITY    EKG EKG Interpretation Date/Time:  Saturday November 21 2023 13:12:52 EDT Ventricular Rate:  129 PR Interval:    QRS Duration:  85 QT Interval:  335 QTC Calculation: 445 R Axis:   -57  Text Interpretation: Atrial fibrillation Confirmed by Lowery Rue [phone]) on 11/21/2023 1:17:50 PM  Radiology DG Chest Portable 1 View Result Date: 11/21/2023 CLINICAL DATA:  pain EXAM: PORTABLE CHEST 1 VIEW COMPARISON:  March 20, 2023, December 25, 2022 FINDINGS: The cardiomediastinal silhouette is unchanged and enlarged in contour.Atherosclerotic calcifications of the tortuous thoracic aorta. No pleural effusion. No pneumothorax. No acute pleuroparenchymal abnormality. Remote RIGHT-sided rib fractures. IMPRESSION: No acute cardiopulmonary abnormality. Electronically Signed   By: Clancy Crimes M.D.   On: 11/21/2023 13:40    Procedures Procedures    Medications Ordered in ED Medications  acetaminophen  (TYLENOL ) tablet 1,000 mg (has no administration in time range)  lidocaine  (LIDODERM ) 5 % 1 patch (has no administration in time range)  furosemide  (LASIX ) injection 60 mg (has no administration in time range)    ED Course/ Medical Decision  Making/ A&P                                 Medical Decision Making Amount and/or Complexity of Data Reviewed Labs: ordered. Radiology: ordered.  Risk OTC drugs. Prescription drug management. Decision regarding hospitalization.   Kristien Salatino is here with shortness of breath.  Overall unremarkable vitals.  EKG shows atrial fibrillation.  Heart rates between 90 and 110.  Does look little bit volume overloaded on exam.  Little bit of rhonchi, a little bit of edema in her legs.  She has noticed symptoms here now over the last week she has been intermittently taking her torsemide  as she takes this as needed.  She has been compliant with her other medications including her Eliquis .  She denies any chest pain.  Differential diagnosis volume overload possibly from heart failure, could be ACS, he is she is anticoagulated and doubt PE is not hypoxic.  Her A-fib overall looks fairly rate controlled.  Her blood pressure is not very high as well.  Will look for anemia infectious process as well.  Will get CBC CMP troponin BNP and chest x-ray.  Will reevaluate.  Chest x-ray with no obvious pneumonia or pneumothorax per my review interpretation.  No major signs of volume overload however BNP is greater than 3000, troponin normal.  Lab work otherwise unremarkable per my review interpretation.  No significant leukocytosis anemia or electrolyte abnormality.  She took a dose of torsemide  this morning.  Looks like she is allergic to furosemide  from itching but she says it was so long ago she cannot really remember if it is a true allergy or not.  Ultimately I think she needs a new echocardiogram, further diuresis and think that this would be a good hospitalist observation stay.  I will order a dose of Bumex  although not sure if we have that medicine here.  She has some sort of questionable allergy to furosemide  but we will either challenge her with IV furosemide  or maybe give her some more oral torsemide  if we do not  have any IV Bumex  here.  Will talk with hospitalist about this.  After talking with hospitalist we will challenge her with Lasix  given that the only medication we have here to give her.  Does not sound like she has had a severe reaction to it in the past she does not really remember it being that bad where she has had maybe some itching.  Does not appear that it is associated with any rash or any other major symptoms.  Will give her a dose of IV Lasix  here.  Monitor for any side effects.  Can give Benadryl as needed.  Overall we will admit to hospitalist for further care.  This chart was dictated using voice recognition software.  Despite best efforts to proofread,  errors can occur which can change the documentation meaning.    Final Clinical Impression(s) / ED Diagnoses Final diagnoses:  Hypervolemia, unspecified hypervolemia type  SOB (shortness of breath)  Acute congestive heart failure, unspecified heart failure type [facility])    Rx / DC Orders ED Discharge Orders     None         Lowery Rue, DO 11/21/23 1445    Lowery Rue, DO 11/21/23 1453    Holt Woolbright, DO 11/21/23 1509

## 2023-11-21 NOTE — H&P (Signed)
 ADMISSION HISTORY AND PHYSICAL   Whitney Burton VQQ:[id number] DOB: [birth year] DOA: 11/21/2023  PCP: Sun, Vyvyan, MD Patient coming from: home via [facility]  Chief Complaint: Shortness of breath  HPI:  89 year old ILF resident with a history of grade 3 diastolic congestive heart failure, HTN, and permanent atrial fibrillation on Eliquis  who presented to [facility] 11/21/23 with complaints of worsening shortness of breath despite using her prescribed torsemide  as directed.  She had not noticed any major weight gain, but had appreciated significant increase swelling of her legs.  Her dyspnea was worse on exertion and with attempts to lay flat.  She denied chest pain.  She appeared to be volume overloaded on exam.  EKG revealed only atrial fibrillation with controlled heart rate.  CXR revealed no evidence of significant infiltrate.  BNP was noted to be >3000.  She denies dietary indiscretion, noncompliance with medications, or any new prescriptions.  Assessment/Plan  Acute exacerbation of chronic diastolic congestive heart Inciting etiology unclear -continue increased diuresis -follow-up TTE -watch I's and O's closely and daily weights -TTE February 2023 noted grade 3 diastolic dysfunction with restrictive physiology as well as severely dilated left and right atria  Chronic atrial fibrillation on Eliquis  Continue usual rate controlling medication and Eliquis  -heart rate well-controlled at present  HTN Continue usual home medications and monitor BP with diuresis   DVT prophylaxis: Eliquis  Code Status:   Code Status: Prior Family Communication: Spoke with daughter at bedside Disposition Plan:  Admit to Inpatient   Review of Systems: As per HPI otherwise 10 point review of systems negative.   Past Medical History:  Diagnosis Date   A-fib [facility])    Anxiety    Arrhythmia    Breast cancer (HCC)    Left Breast   Hypertension     Past Surgical History:  Procedure Laterality  Date   HEMORRHOID SURGERY     LIVER SURGERY     found lumps in late 62s   PARTIAL HYSTERECTOMY      Family History  Family History  Problem Relation Age of Onset   Heart attack Father    Pancreatic cancer Brother    Colon cancer Neg Hx    Rectal cancer Neg Hx    Stomach cancer Neg Hx    Esophageal cancer Neg Hx    Liver cancer Neg Hx     Social History   reports that she has quit smoking. She has never used smokeless tobacco. She reports current alcohol use of about 1.0 standard drink of alcohol per week. She reports that she does not use drugs.  Allergies Allergies  Allergen Reactions   Ciprofloxacin Hcl     Other reaction(s): Unknown   Levofloxacin     Other reaction(s): Unknown   Ondansetron  Hcl     Other reaction(s): constipation   Furosemide  Itching    Itching after taking Lasix ?    Prior to Admission medications   Medication Sig Start Date End Date Taking? Authorizing Provider  apixaban  (ELIQUIS ) 5 MG TABS tablet Take 1 tablet (5 mg total) by mouth 2 (two) times daily. 06/19/23  Yes West, Katlyn D, NP  Cholecalciferol (VITAMIN D3) 25 MCG (1000 UT) CAPS Take by mouth. Take 1000 IU daily, unsure of dose   Yes [provider]  citalopram  (CELEXA ) 10 MG tablet 1 tablet Orally Once a day for 30 days Patient taking differently: Take 20 mg by mouth daily. Increased to 20 mg daily. 10/22/22  Yes Wilhelmena Hanson, FNP  TIADYLT  ER 240 MG 24 hr capsule TAKE 1 CAPSULE BY MOUTH EVERY DAY 04/20/23  Yes O'Neal, Cathay Clonts, MD  torsemide  (DEMADEX ) 20 MG tablet Take 1 tablet (20 mg total) by mouth daily as needed. Take as needed for weight gain on 3 lbs in 1 day or 5 lbs in 1 week 09/24/23  Yes O'Neal, Cathay Clonts, MD  acetaminophen  (TYLENOL ) 500 MG tablet Take 500 mg by mouth every 6 (six) hours as needed for mild pain.    [provider]  feeding supplement (ENSURE ENLIVE / ENSURE PLUS) LIQD Take 237 mLs by mouth 3 (three) times daily between meals. 12/30/22    Kraig Peru, MD  lidocaine  (LIDODERM ) 5 % Place 2 patches onto the skin daily. Remove & Discard patch within 12 hours or as directed by MD 12/31/22   Maczis, Michael M, PA-C  polyethylene glycol (MIRALAX  / GLYCOLAX ) 17 g packet Take 17 g by mouth daily as needed. 12/31/22   Maczis, Michael M, PA-C  potassium chloride  (KLOR-CON  M) 10 MEQ tablet Take 1 tablet (10 mEq total) by mouth as directed. Take with torsemide . 09/24/23   O'Neal, Cathay Clonts, MD    Physical Exam: Vitals:   11/21/23 1626 11/21/23 1626 11/21/23 1627 11/21/23 1700  BP: 139/79     Pulse: (!) 58  65   Resp: (!) 30  (!) 32   Temp:  97.7 F (36.5 C)    TempSrc:  Oral    SpO2: 96%  95%   Weight:    63.4 kg  Height:    4\' 11"  (1.499 m)    Constitutional: NAD, calm, comfortable Eyes: PERRL, lids and conjunctivae normal ENMT: Mucous membranes are moist.  Respiratory: clear to auscultation bilaterally, no wheezing, no crackles. Normal respiratory effort. No accessory muscle use.  Cardiovascular: Regular rate and rhythm, no murmurs / rubs / gallops.  2+ bilateral lower extremity edema.  Abdomen: No tenderness or masses to palpation. No hepatosplenomegaly. Bowel sounds positive. Not distended. Soft.  Musculoskeletal: No clubbing / cyanosis. No joint deformity upper and lower extremities. No contractures. Normal muscle tone.  Skin: No rashes, lesions, ulcers.  Psychiatric: Normal judgment and insight. Alert and oriented x 3. Normal mood.    Labs on Admission:   CBC: Recent Labs  Lab 11/21/23 1324  WBC 8.0  NEUTROABS 5.1  HGB 15.6*  HCT 45.1  MCV 95.3  PLT 296   Basic Metabolic Panel: Recent Labs  Lab 11/21/23 1412  NA 136  K 3.9  CL 98  CO2 24  GLUCOSE 100*  BUN 17  CREATININE 0.66  CALCIUM 10.0   GFR: Estimated Creatinine Clearance: 39.4 mL/min (by C-G formula based on SCr of 0.66 mg/dL).  Liver Function Tests: Recent Labs  Lab 11/21/23 1412  AST 20  ALT 13  ALKPHOS 87  BILITOT 0.6  PROT  7.3  ALBUMIN 4.4    Radiological Exams on Admission: DG Chest Portable 1 View Result Date: 11/21/2023 IMPRESSION: No acute cardiopulmonary abnormality. Electronically Signed   By: Clancy Crimes M.D.   On: 11/21/2023 13:40    Abbe Abate, MD [facility] Office  [phone] Pager - Text Page per Amion as per below:  On-Call/Text Page:      [email or web address]  If 7PM-7AM, please contact night-coverage [email or web address] 11/21/2023, 5:38 PM

## 2023-11-21 NOTE — ED Notes (Signed)
 [facility] cl called for transport

## 2023-11-22 ENCOUNTER — Inpatient Hospital Stay (HOSPITAL_COMMUNITY)

## 2023-11-22 DIAGNOSIS — I5033 Acute on chronic diastolic (congestive) heart failure: Secondary | ICD-10-CM | POA: Diagnosis not present

## 2023-11-22 DIAGNOSIS — I5031 Acute diastolic (congestive) heart failure: Secondary | ICD-10-CM | POA: Diagnosis not present

## 2023-11-22 LAB — ECHOCARDIOGRAM COMPLETE
Area-P 1/2: 5.75 cm2
Height: 59 in
S' Lateral: 3.8 cm
Weight: 2174.4 [oz_av]

## 2023-11-22 LAB — BASIC METABOLIC PANEL WITH GFR
Anion gap: 11 (ref 5–15)
BUN: 13 mg/dL (ref 8–23)
CO2: 28 mmol/L (ref 22–32)
Calcium: 9.3 mg/dL (ref 8.9–10.3)
Chloride: 97 mmol/L — ABNORMAL LOW (ref 98–111)
Creatinine, Ser: 0.71 mg/dL (ref 0.44–1.00)
GFR, Estimated: >60 mL/min (ref >60)
Glucose, Bld: 98 mg/dL (ref 70–99)
Potassium: 3.4 mmol/L — ABNORMAL LOW (ref 3.5–5.1)
Sodium: 136 mmol/L (ref 135–145)

## 2023-11-22 LAB — MAGNESIUM: Magnesium: 1.9 mg/dL (ref 1.7–2.4)

## 2023-11-22 MED ORDER — METOPROLOL TARTRATE 5 MG/5ML IV SOLN
10.0000 mg | Freq: Once | INTRAVENOUS | Status: AC
  Administered 2023-11-22: 10 mg via INTRAVENOUS
  Filled 2023-11-22: qty 10

## 2023-11-22 MED ORDER — PERFLUTREN LIPID MICROSPHERE
1.0000 mL | INTRAVENOUS | Status: DC | PRN
  Administered 2023-11-22: 1 mL via INTRAVENOUS

## 2023-11-22 MED ORDER — FUROSEMIDE 10 MG/ML IJ SOLN
60.0000 mg | Freq: Two times a day (BID) | INTRAMUSCULAR | Status: DC
  Administered 2023-11-22 – 2023-11-23 (×2): 60 mg via INTRAVENOUS
  Filled 2023-11-22 (×2): qty 6

## 2023-11-22 MED ORDER — POTASSIUM CHLORIDE 20 MEQ PO PACK
40.0000 meq | PACK | Freq: Two times a day (BID) | ORAL | Status: DC
  Administered 2023-11-22 – 2023-11-23 (×3): 40 meq via ORAL
  Filled 2023-11-22 (×3): qty 2

## 2023-11-22 MED ORDER — METOPROLOL TARTRATE 12.5 MG HALF TABLET
12.5000 mg | ORAL_TABLET | Freq: Two times a day (BID) | ORAL | Status: DC
  Administered 2023-11-22 (×2): 12.5 mg via ORAL
  Filled 2023-11-22 (×2): qty 1

## 2023-11-22 MED ORDER — FUROSEMIDE 10 MG/ML IJ SOLN: 20.0000 mg | Freq: Two times a day (BID) | INTRAMUSCULAR | Status: DC

## 2023-11-22 MED ORDER — POTASSIUM CHLORIDE CRYS ER 20 MEQ PO TBCR
40.0000 meq | EXTENDED_RELEASE_TABLET | Freq: Two times a day (BID) | ORAL | Status: DC
  Filled 2023-11-22: qty 2

## 2023-11-22 MED ORDER — FUROSEMIDE 10 MG/ML IJ SOLN: 40.0000 mg | Freq: Two times a day (BID) | INTRAMUSCULAR | Status: DC

## 2023-11-22 NOTE — Plan of Care (Signed)

## 2023-11-22 NOTE — Plan of Care (Signed)
   Problem: Education: Goal: Knowledge of General Education information will improve Description Including pain rating scale, medication(s)/side effects and non-pharmacologic comfort measures Outcome: Progressing   Problem: Health Behavior/Discharge Planning: Goal: Ability to manage health-related needs will improve Outcome: Progressing

## 2023-11-22 NOTE — Progress Notes (Signed)
 Whitney Burton  ZOX:[id number] DOB: 1932-12-02 DOA: 11/21/2023 PCP: Sun, Vyvyan, MD    Brief Narrative:  89 year old ILF resident with a history of grade 3 diastolic congestive heart failure, HTN, and permanent atrial fibrillation on Eliquis  who presented to [facility] 11/21/23 with complaints of worsening shortness of breath despite using her prescribed torsemide  as directed.  She had not noticed any major weight gain, but had appreciated significant increased swelling of her legs.  Her dyspnea was worse on exertion and with attempts to lay flat.  She denied chest pain.  She appeared to be volume overloaded on exam.  EKG revealed only atrial fibrillation with controlled heart rate.  CXR revealed no evidence of significant infiltrate or edema.  BNP was noted to be >3000.  Goals of Care:   Code Status: Full Code   DVT prophylaxis:  apixaban  (ELIQUIS ) tablet 5 mg   Interim Hx: No acute events recorded since admission.  Afebrile.  Vital signs stable.  Heart rate controlled at the time of my visit, but she did have an episode of tachycardia with palpitations with heart rate up to 140 this morning which responded well to a single dose of IV Lopressor .  Saturation 96% room air.  On further history she tells me she has had relatively frequent episodes of palpitations at home over the last few weeks.  Assessment & Plan:  Acute exacerbation of chronic diastolic congestive heart Inciting etiology now likely related to uncontrolled rate in setting of PAF - continue increased diuresis - follow-up TTE - watch I's and O's closely and daily weights -TTE February 2023 noted grade 3 diastolic dysfunction with restrictive physiology as well as severely dilated left and right atria -net negative approximately 1.5 L thus far  [employer]   11/21/23 1700 11/22/23 0500  Weight: 63.4 kg 61.6 kg      Chronic atrial fibrillation on Eliquis  w/ acute RVR Suspect paroxysms of RVR are to blame for her acute CHF  exac - add BB - monitor on tele - consult her Cardiologist if her HR proves difficult to control    HTN Continue usual home medications and monitor BP with diuresis  Mild hypokalemia Due to diuresis -increase supplemental potassium dose -magnesium is normal   Family Communication: No family present at time of exam today Disposition: Will depend upon performance with PT/OT   Objective: Blood pressure (!) 129/91, pulse 85, temperature 97.7 F (36.5 C), temperature source Oral, resp. rate 20, height 4\' 11"  (1.499 m), weight 61.6 kg, SpO2 96%.  Intake/Output Summary (Last 24 hours) at 11/22/2023 0814 Last data filed at 11/22/2023 0750 Gross per 24 hour  Intake 360 ml  Output 1850 ml  Net -1490 ml   Filed Weights   11/21/23 1700 11/22/23 0500  Weight: 63.4 kg 61.6 kg    Examination: General: No acute respiratory distress Lungs: Clear to auscultation bilaterally - no M  Cardiovascular: Irregularly irregular with rate controlled Abdomen: Nontender, nondistended, soft, bowel sounds positive, no rebound, no ascites, no appreciable mass Extremities: 1+ bilateral lower extremity edema  CBC: Recent Labs  Lab 11/21/23 1324  WBC 8.0  NEUTROABS 5.1  HGB 15.6*  HCT 45.1  MCV 95.3  PLT 296   Basic Metabolic Panel: Recent Labs  Lab 11/21/23 1412 11/22/23 0206  NA 136 136  K 3.9 3.4*  CL 98 97*  CO2 24 28  GLUCOSE 100* 98  BUN 17 13  CREATININE 0.66 0.71  CALCIUM 10.0 9.3  MG  --  1.9   GFR: Estimated Creatinine Clearance: 38.8 mL/min (by C-G formula based on SCr of 0.71 mg/dL).   Scheduled Meds:  apixaban   5 mg Oral BID   citalopram   20 mg Oral Daily   diltiazem   240 mg Oral Daily   furosemide   40 mg Intravenous Q12H   lidocaine   1 patch Transdermal Q24H   potassium chloride   20 mEq Oral BID   sodium chloride  flush  3 mL Intravenous Q12H   Continuous Infusions:  sodium chloride        LOS: 1 day   Abbe Abate, MD [facility] Office   [phone] Pager - Text Page per [place]  If 7PM-7AM, please contact night-coverage per Amion 11/22/2023, 8:14 AM

## 2023-11-23 DIAGNOSIS — I5043 Acute on chronic combined systolic (congestive) and diastolic (congestive) heart failure: Secondary | ICD-10-CM | POA: Diagnosis not present

## 2023-11-23 DIAGNOSIS — I10 Essential (primary) hypertension: Secondary | ICD-10-CM | POA: Diagnosis not present

## 2023-11-23 DIAGNOSIS — I4821 Permanent atrial fibrillation: Secondary | ICD-10-CM | POA: Diagnosis not present

## 2023-11-23 DIAGNOSIS — I714 Abdominal aortic aneurysm, without rupture, unspecified: Secondary | ICD-10-CM

## 2023-11-23 LAB — BASIC METABOLIC PANEL WITH GFR
Anion gap: 9 (ref 5–15)
BUN: 19 mg/dL (ref 8–23)
CO2: 26 mmol/L (ref 22–32)
Calcium: 9.5 mg/dL (ref 8.9–10.3)
Chloride: 97 mmol/L — ABNORMAL LOW (ref 98–111)
Creatinine, Ser: 0.9 mg/dL (ref 0.44–1.00)
GFR, Estimated: >60 mL/min (ref >60)
Glucose, Bld: 95 mg/dL (ref 70–99)
Potassium: 4.1 mmol/L (ref 3.5–5.1)
Sodium: 132 mmol/L — ABNORMAL LOW (ref 135–145)

## 2023-11-23 LAB — CBC
HCT: 46.3 % — ABNORMAL HIGH (ref 36.0–46.0)
Hemoglobin: 16 g/dL — ABNORMAL HIGH (ref 12.0–15.0)
MCH: 32.6 pg (ref 26.0–34.0)
MCHC: 34.6 g/dL (ref 30.0–36.0)
MCV: 94.3 fL (ref 80.0–100.0)
Platelets: 298 10*3/uL (ref 150–400)
RBC: 4.91 MIL/uL (ref 3.87–5.11)
RDW: 14.9 % (ref 11.5–15.5)
WBC: 8.5 10*3/uL (ref 4.0–10.5)
nRBC: 0 % (ref 0.0–0.2)

## 2023-11-23 LAB — MAGNESIUM: Magnesium: 1.9 mg/dL (ref 1.7–2.4)

## 2023-11-23 LAB — TSH: TSH: 1.409 u[IU]/mL (ref 0.350–4.500)

## 2023-11-23 MED ORDER — METOPROLOL TARTRATE 25 MG PO TABS
25.0000 mg | ORAL_TABLET | Freq: Two times a day (BID) | ORAL | Status: DC
  Administered 2023-11-23 – 2023-11-26 (×6): 25 mg via ORAL
  Filled 2023-11-23 (×6): qty 1

## 2023-11-23 MED ORDER — METOPROLOL TARTRATE 5 MG/5ML IV SOLN
5.0000 mg | Freq: Four times a day (QID) | INTRAVENOUS | Status: DC | PRN
  Administered 2023-11-24: 5 mg via INTRAVENOUS
  Filled 2023-11-23: qty 5

## 2023-11-23 MED ORDER — METOPROLOL TARTRATE 50 MG PO TABS: 50.0000 mg | ORAL_TABLET | Freq: Two times a day (BID) | ORAL | Status: DC

## 2023-11-23 MED ORDER — SACUBITRIL-VALSARTAN 24-26 MG PO TABS
1.0000 | ORAL_TABLET | Freq: Two times a day (BID) | ORAL | Status: AC
  Administered 2023-11-23 – 2023-11-26 (×7): 1 via ORAL
  Filled 2023-11-23 (×7): qty 1

## 2023-11-23 MED ORDER — METOPROLOL TARTRATE 25 MG PO TABS
25.0000 mg | ORAL_TABLET | Freq: Two times a day (BID) | ORAL | Status: DC
  Administered 2023-11-23: 25 mg via ORAL
  Filled 2023-11-23: qty 1

## 2023-11-23 NOTE — Plan of Care (Signed)

## 2023-11-23 NOTE — Progress Notes (Signed)
 Whitney Burton  ZOX:[id number] DOB: [birth year] DOA: 11/21/2023 PCP: Sun, Vyvyan, MD    Brief Narrative:  89 year old ILF resident with a history of grade 3 diastolic congestive heart failure, HTN, and permanent atrial fibrillation on Eliquis  who presented to [facility] 11/21/23 with complaints of worsening shortness of breath despite using her prescribed torsemide  as directed.  She had not noticed any major weight gain, but had appreciated significant increased swelling of her legs.  Her dyspnea was worse on exertion and with attempts to lay flat.  She denied chest pain.  She appeared to be volume overloaded on exam.  EKG revealed only atrial fibrillation with controlled heart rate.  CXR revealed no evidence of significant infiltrate or edema.  BNP was noted to be >3000.  Goals of Care:   Code Status: Full Code   DVT prophylaxis:  apixaban  (ELIQUIS ) tablet 5 mg   Interim Hx: Afebrile.  Vital signs stable.  Heart rate well-controlled overnight.  At the time of visit patient reports that she is having recurring episodes of dyspnea just like she was having at home. She denies cp, n/v, or abdom pain.   TTE has revealed new systolic CHF with a EF 30-35% with indeterminate diastolic parameters.  Assessment & Plan:  Acute exacerbation of chronic diastolic congestive heart - newly diagnosed systolic CHF likely related to uncontrolled rate in setting of PAF - continue increased diuresis - TTE this admit noted EF 30-35% with indeterminate diastolic parameters -TTE February 2023 noted EF 55-60% with grade 3 diastolic dysfunction with restrictive physiology as well as severely dilated left and right atria - net negative approximately 1. L thus far - discontinue CCB given newly appreciated significant systolic failure -attempt to control heart rate with BB alone - will ask her Cardiologist to see her given this new diagnosis of systolic heart failure  Filed Weights   11/21/23 1700 11/22/23 0500  11/23/23 0514  Weight: 63.4 kg 61.6 kg 61.8 kg      Chronic atrial fibrillation on Eliquis  w/ acute RVR Suspect paroxysms of RVR are to blame for her acute CHF exac - added BB - monitor on tele    HTN Blood pressure presently well-controlled  Mild hypokalemia Due to diuresis -resolved with supplementation   Family Communication: No family present at time of exam today Disposition: Will depend upon performance with PT/OT   Objective: Blood pressure 113/77, pulse 87, temperature 97.8 F (36.6 C), temperature source Oral, resp. rate 20, height 4\' 11"  (1.499 m), weight 61.8 kg, SpO2 94%.  Intake/Output Summary (Last 24 hours) at 11/23/2023 0739 Last data filed at 11/23/2023 0738 Gross per 24 hour  Intake 720 ml  Output 200 ml  Net 520 ml   Filed Weights   11/21/23 1700 11/22/23 0500 11/23/23 0514  Weight: 63.4 kg 61.6 kg 61.8 kg    Examination: General: No acute respiratory distress at the time of my visit Lungs: Clear to auscultation bilaterally -no wheezing Cardiovascular: Irregularly irregular with rate controlled -no murmur or rub Abdomen: NT/ND, soft, BS positive, no rebound Extremities: 1+ bilateral lower extremity edema  CBC: Recent Labs  Lab 11/21/23 1324 11/23/23 0209  WBC 8.0 8.5  NEUTROABS 5.1  --   HGB 15.6* 16.0*  HCT 45.1 46.3*  MCV 95.3 94.3  PLT 296 298   Basic Metabolic Panel: Recent Labs  Lab 11/21/23 1412 11/22/23 0206 11/23/23 0209  NA 136 136 132*  K 3.9 3.4* 4.1  CL 98 97* 97*  CO2 24 28  26  GLUCOSE 100* 98 95  BUN 17 13 19   CREATININE 0.66 0.71 0.90  CALCIUM 10.0 9.3 9.5  MG  --  1.9 1.9   GFR: Estimated Creatinine Clearance: 34.5 mL/min (by C-G formula based on SCr of 0.9 mg/dL).   Scheduled Meds:  apixaban   5 mg Oral BID   citalopram   20 mg Oral Daily   diltiazem   240 mg Oral Daily   furosemide   60 mg Intravenous Q12H   lidocaine   1 patch Transdermal Q24H   metoprolol  tartrate  12.5 mg Oral BID   potassium chloride   40  mEq Oral BID   sodium chloride  flush  3 mL Intravenous Q12H     LOS: 2 days   Abbe Abate, MD [facility] Office  [phone] Pager - Text Page per Whitney Burton  If 7PM-7AM, please contact night-coverage per Amion 11/23/2023, 7:39 AM

## 2023-11-23 NOTE — Consult Note (Addendum)
 Cardiology Consultation   Patient ID: Kelsie Zaborowski MRN: [id number]; DOB: [birth year]  Admit date: 11/21/2023 Date of Consult: 11/23/2023  PCP:  Sun, Vyvyan, MD   [facility] Providers Cardiologist:  Oneil Bigness, MD        Patient Profile: Elzora Cullins is a 89 y.o. female with a hx of permanent atrial fibrillation on Eliquis , HFpEF and hypertension who is being seen 11/23/2023 for the evaluation of new reduced ejection fraction at the request of Elvera Hamilton, MD.  History of Present Illness: Ms. Belleville is an 89 yo female with the history above who follows with Dr. Cooper Denver. She has a chronic history of atrial fibrillation since 2018 and was initially managed in [facility] . She had multiple cardioversions with quick reversion back to atrial fibrillation. Has failed/declined multiple AADs. Her main symptom was fatigue, though she found no relief during normal sinus rhythm. In 2019 shared decision to stop pursuing rhythm control and switch to rate control with diltiazem  and nebivolol . In 2019 she started to experience shortness of breath thought to be related to deconditioning as it was not related to her atrial fibrillation, and she was not volume overloaded. At that time she had a negative ischemic evaluation. In 2021 she was diagnosed with chronic diastolic heart failure, and she was put on torsemide  20 mg every other day. In 2023 she was put on spironolactone  12.5 mg daily. In 2024 spironolactone  was discontinued due to hyperkalemia, and her nebivolol  was discontinued due to to bradycardia (40s to 50s).   Last seen in office on September 24, 2023 by Dr. Lucy Sack.  At that time she was experiencing shortness of breath with minimal activity attributed to deconditioning as she was not volume overloaded.   Presented to ED at [facility] on 11/21/2023 with worsening shortness of breath including dyspnea on exertion and orthopnea.  She was noted to be volume overloaded on exam. Blood  pressure 141/90.  Her proBNP was 3177.  Negative troponins . EKG showed atrial fibrillation with ventricular rate 129.  Echo yesterday showed reduction in EF 30 to 35% (08/08/2021 55 to 60%) with small pericardial effusion present.   Today, she is feeling much better. She reports improved peripheral edema and shortness of breath though still present. Denies PND. Does have some lightheadedness/dizziness with position changes. Occasional palpitations with AF, no other associated symptom at that time. Denies chest pain. Denies bloody stools  She lives alone at an independent facility. She makes her own breakfast/lunch, but has noticed it becoming more difficult due to fatigue and shortness of breath. Denies recent illness or cough.   Past Medical History:  Diagnosis Date   A-fib [facility])    Anxiety    Arrhythmia    Breast cancer (HCC)    Left Breast   Hypertension     Past Surgical History:  Procedure Laterality Date   HEMORRHOID SURGERY     LIVER SURGERY     found lumps in late 35s   PARTIAL HYSTERECTOMY         Scheduled Meds:  apixaban   5 mg Oral BID   citalopram   20 mg Oral Daily   furosemide   60 mg Intravenous Q12H   lidocaine   1 patch Transdermal Q24H   metoprolol  tartrate  25 mg Oral BID   potassium chloride   40 mEq Oral BID   sodium chloride  flush  3 mL Intravenous Q12H   Continuous Infusions:  PRN Meds: acetaminophen , polyethylene glycol, prochlorperazine, sodium chloride  flush  Allergies:  Allergies  Allergen Reactions   Ciprofloxacin Hcl     Other reaction(s): Unknown   Levofloxacin     Other reaction(s): Unknown   Ondansetron  Hcl     Other reaction(s): constipation    Social History:   Social History   Socioeconomic History   Marital status: Widowed    Spouse name: Not on file   Number of children: 3   Years of education: Not on file   Highest education level: Not on file  Occupational History   Occupation: retired  Tobacco Use   Smoking status:  Former   Smokeless tobacco: Never   Tobacco comments:    Smoked in teens  Vaping Use   Vaping status: Never Used  Substance and Sexual Activity   Alcohol use: Yes    Alcohol/week: 1.0 standard drink of alcohol    Types: 1 Glasses of wine per week    Comment: Occassional   Drug use: Never   Sexual activity: Not Currently  Other Topics Concern   Not on file  Social History Narrative   Widowed. Moved to [place] to be closer to daughter.    Social Drivers of Corporate investment banker Strain: Not on file  Food Insecurity: No Food Insecurity (11/21/2023)   Hunger Vital Sign    Worried About Running Out of Food in the Last Year: Never true    Ran Out of Food in the Last Year: Never true  Transportation Needs: No Transportation Needs (11/21/2023)   PRAPARE - Administrator, Civil Service (Medical): No    Lack of Transportation (Non-Medical): No  Physical Activity: Not on file  Stress: Not on file  Social Connections: Moderately Isolated (11/21/2023)   Social Connection and Isolation Panel [NHANES]    Frequency of Communication with Friends and Family: More than three times a week    Frequency of Social Gatherings with Friends and Family: Never    Attends Religious Services: Never    Database administrator or Organizations: Yes    Attends Engineer, structural: More than 4 times per year    Marital Status: Widowed  Intimate Partner Violence: Not At Risk (11/21/2023)   Humiliation, Afraid, Rape, and Kick questionnaire    Fear of Current or Ex-Partner: No    Emotionally Abused: No    Physically Abused: No    Sexually Abused: No    Family History:   Family History  Problem Relation Age of Onset   Heart attack Father    Pancreatic cancer Brother    Colon cancer Neg Hx    Rectal cancer Neg Hx    Stomach cancer Neg Hx    Esophageal cancer Neg Hx    Liver cancer Neg Hx      ROS:  Please see the history of present illness.  All other ROS reviewed and  negative.     Physical Exam/Data: Vitals:   11/23/23 0514 11/23/23 0737 11/23/23 0922 11/23/23 1138  BP: 136/74 113/77 114/70 (!) 128/96  Pulse: 84 87 (!) 109 88  Resp: 20 20  20   Temp: 97.8 F (36.6 C) 97.8 F (36.6 C)  98.2 F (36.8 C)  TempSrc: Oral Oral  Oral  SpO2: 91% 94%  96%  Weight: 61.8 kg     Height:        Intake/Output Summary (Last 24 hours) at 11/23/2023 1514 Last data filed at 11/23/2023 1225 Gross per 24 hour  Intake 600 ml  Output 200 ml  Net  400 ml      11/23/2023    5:14 AM 11/22/2023    5:00 AM 11/21/2023    5:00 PM  Last 3 Weights  Weight (lbs) 136 lb 3.9 oz 135 lb 14.4 oz 139 lb 11.2 oz  Weight (kg) 61.8 kg 61.644 kg 63.368 kg     Body mass index is 27.52 kg/m.  General:  Fragile sitting upright in chair, in no acute distress HEENT: normal Neck: no JVD Vascular: No carotid bruits; Distal pulses 1+ bilaterally Cardiac:  irregular rhythm, tachycardiac rate, normal S1, S2; no murmur  Lungs:  clear to auscultation bilaterally, no wheezing, rhonchi or rales  Abd: soft, nontender, no hepatomegaly  Ext: trace edema BLE Musculoskeletal:  No deformities Skin: warm and dry  Neuro:  CNs 2-12 intact, no focal abnormalities noted Psych:  Normal affect   EKG:  The EKG was personally reviewed and demonstrates:  see HPI. Telemetry:  Telemetry was personally reviewed and demonstrates:  atrial fibrillation HR avg 100 with 150 peak. Occasional PVC.  Relevant CV Studies:  Echo on 08/08/2021 IMPRESSIONS    1. Left ventricular ejection fraction, by estimation, is 55 to 60%. The  left ventricle has normal function. The left ventricle has no regional  wall motion abnormalities. Left ventricular diastolic parameters are  consistent with Grade III diastolic  dysfunction (restrictive). Elevated left atrial pressure.   2. Right ventricular systolic function is normal. The right ventricular  size is mildly enlarged. There is normal pulmonary artery systolic  pressure.    3. Left atrial size was severely dilated.   4. Right atrial size was severely dilated.   5. The mitral valve is normal in structure. Mild mitral valve  regurgitation. No evidence of mitral stenosis.   6. Tricuspid valve regurgitation is moderate.   7. The aortic valve is normal in structure. Aortic valve regurgitation is  not visualized. No aortic stenosis is present.   8. The inferior vena cava is normal in size with greater than 50%  respiratory variability, suggesting right atrial pressure of 3 mmHg.   Laboratory Data: High Sensitivity Troponin:  No results for input(s): "TROPONINIHS" in the last 720 hours.   Chemistry Recent Labs  Lab 11/21/23 1412 11/22/23 0206 11/23/23 0209  NA 136 136 132*  K 3.9 3.4* 4.1  CL 98 97* 97*  CO2 24 28 26   GLUCOSE 100* 98 95  BUN 17 13 19   CREATININE 0.66 0.71 0.90  CALCIUM 10.0 9.3 9.5  MG  --  1.9 1.9  GFRNONAA >60 >60 >60  ANIONGAP 14 11 9     Recent Labs  Lab 11/21/23 1412  PROT 7.3  ALBUMIN 4.4  AST 20  ALT 13  ALKPHOS 87  BILITOT 0.6   Lipids No results for input(s): "CHOL", "TRIG", "HDL", "LABVLDL", "LDLCALC", "CHOLHDL" in the last 168 hours.  Hematology Recent Labs  Lab 11/21/23 1324 11/23/23 0209  WBC 8.0 8.5  RBC 4.73 4.91  HGB 15.6* 16.0*  HCT 45.1 46.3*  MCV 95.3 94.3  MCH 33.0 32.6  MCHC 34.6 34.6  RDW 15.3 14.9  PLT 296 298   Thyroid   Recent Labs  Lab 11/23/23 0209  TSH 1.409    BNP Recent Labs  Lab 11/21/23 1412  PROBNP 3,177.0*    DDimer No results for input(s): "DDIMER" in the last 168 hours.  Radiology/Studies:  ECHOCARDIOGRAM COMPLETE Result Date: 11/22/2023    ECHOCARDIOGRAM REPORT   Patient Name:   ADREANA COULL Date of Exam: 11/22/2023 Medical Rec #:  [id number]  Height:       59.0 in Accession #:    [id number] Weight:       135.9 lb Date of Birth:  [birth year]   BSA:          1.565 m Patient Age:    89 years   BP:           132/70 mmHg Patient Gender: F          HR:           99 bpm. Exam  Location:  Inpatient Procedure: 2D Echo, Color Doppler, Cardiac Doppler and Intracardiac            Opacification Agent (Both Spectral and Color Flow Doppler were            utilized during procedure). Indications:    CHF Acute Diastolic  History:        Patient has no prior history of Echocardiogram examinations,                 most recent 08/08/2021. CHF, Arrythmias:Atrial Fibrillation; Risk                 Factors:Former Smoker and Hypertension. HFpEF.  Sonographer:    Gelene Kelly RDCS Referring Phys: [id number] JEFFREY T MCCLUNG IMPRESSIONS  1. Left ventricular ejection fraction, by estimation, is 30 to 35%. The left ventricle has moderately decreased function. The left ventricle demonstrates global hypokinesis. The left ventricular internal cavity size was moderately dilated. Left ventricular diastolic parameters are indeterminate.  2. Right ventricular systolic function is mildly reduced. The right ventricular size is mildly enlarged.  3. Left atrial size was severely dilated.  4. Right atrial size was severely dilated.  5. A small pericardial effusion is present. The pericardial effusion is posterior to the left ventricle.  6. The mitral valve is abnormal. Mild mitral valve regurgitation. No evidence of mitral stenosis.  7. Tricuspid valve regurgitation is moderate.  8. The aortic valve is tricuspid. There is mild calcification of the aortic valve. There is mild thickening of the aortic valve. Aortic valve regurgitation is trivial. Aortic valve sclerosis is present, with no evidence of aortic valve stenosis.  9. The inferior vena cava is normal in size with greater than 50% respiratory variability, suggesting right atrial pressure of 3 mmHg. FINDINGS  Left Ventricle: Left ventricular ejection fraction, by estimation, is 30 to 35%. The left ventricle has moderately decreased function. The left ventricle demonstrates global hypokinesis. Definity contrast agent was given IV to delineate the left ventricular  endocardial borders. Strain was performed and the global longitudinal strain is indeterminate. The left ventricular internal cavity size was moderately dilated. There is no left ventricular hypertrophy. Left ventricular diastolic parameters are indeterminate. Right Ventricle: The right ventricular size is mildly enlarged. No increase in right ventricular wall thickness. Right ventricular systolic function is mildly reduced. Left Atrium: Left atrial size was severely dilated. Right Atrium: Right atrial size was severely dilated. Pericardium: A small pericardial effusion is present. The pericardial effusion is posterior to the left ventricle. Mitral Valve: The mitral valve is abnormal. There is mild thickening of the mitral valve leaflet(s). Mild mitral valve regurgitation. No evidence of mitral valve stenosis. Tricuspid Valve: The tricuspid valve is normal in structure. Tricuspid valve regurgitation is moderate . No evidence of tricuspid stenosis. Aortic Valve: The aortic valve is tricuspid. There is mild calcification of the aortic valve. There is mild thickening of the aortic valve. Aortic valve regurgitation is trivial. Aortic  valve sclerosis is present, with no evidence of aortic valve stenosis. Pulmonic Valve: The pulmonic valve was normal in structure. Pulmonic valve regurgitation is mild. No evidence of pulmonic stenosis. Aorta: The aortic root is normal in size and structure. Venous: The inferior vena cava is normal in size with greater than 50% respiratory variability, suggesting right atrial pressure of 3 mmHg. IAS/Shunts: No atrial level shunt detected by color flow Doppler. Additional Comments: 3D was performed not requiring image post processing on an independent workstation and was indeterminate.  LEFT VENTRICLE PLAX 2D LVIDd:         4.70 cm   Diastology LVIDs:         3.80 cm   LV e' medial:    6.96 cm/s LV PW:         1.10 cm   LV E/e' medial:  12.3 LV IVS:        1.00 cm   LV e' lateral:   4.57 cm/s  LVOT diam:     2.00 cm   LV E/e' lateral: 18.8 LV SV:         32 LV SV Index:   20 LVOT Area:     3.14 cm  RIGHT VENTRICLE RV S prime:     15.30 cm/s TAPSE (M-mode): 1.0 cm LEFT ATRIUM             Index        RIGHT ATRIUM           Index LA diam:        4.50 cm 2.88 cm/m   RA Area:     22.10 cm LA Vol (A2C):   59.6 ml 38.08 ml/m  RA Volume:   62.50 ml  39.94 ml/m LA Vol (A4C):   75.0 ml 47.92 ml/m LA Biplane Vol: 68.9 ml 44.02 ml/m  AORTIC VALVE LVOT Vmax:   57.70 cm/s LVOT Vmean:  39.800 cm/s LVOT VTI:    0.102 m  AORTA Ao Root diam: 3.20 cm Ao Asc diam:  3.50 cm MITRAL VALVE MV Area (PHT): 5.75 cm    SHUNTS MV Decel Time: 132 msec    Systemic VTI:  0.10 m MV E velocity: 85.70 cm/s  Systemic Diam: 2.00 cm MV A velocity: 25.20 cm/s MV E/A ratio:  3.40 Janelle Mediate MD Electronically signed by Janelle Mediate MD Signature Date/Time: 11/22/2023/12:08:35 PM    Final    DG Chest Portable 1 View Result Date: 11/21/2023 CLINICAL DATA:  pain EXAM: PORTABLE CHEST 1 VIEW COMPARISON:  March 20, 2023, December 25, 2022 FINDINGS: The cardiomediastinal silhouette is unchanged and enlarged in contour.Atherosclerotic calcifications of the tortuous thoracic aorta. No pleural effusion. No pneumothorax. No acute pleuroparenchymal abnormality. Remote RIGHT-sided rib fractures. IMPRESSION: No acute cardiopulmonary abnormality. Electronically Signed   By: Clancy Crimes M.D.   On: 11/21/2023 13:40     Assessment and Plan: Acute on chronic systolic and diastolic heart failure Presented to [facility] for worsening shortness of breath, found to be volume overloaded and received IV 0.5 mg of Bumex . Pro-BNP 3,1770. SpO2 on room air has been in the mid 90s. She has been receiving IV Lasix  60 mg every 12 hours. Creatinine bump 0.66->0.9   On exam today trace pedal edema. While charted I/Os show net positive, patient reports good urine output.   Per chart review negative ischemic evaluation in 2019, unfortunately do not have  results as this was done outside the [facility] system. Patient denies chest pain. Unlikely to  be ischemic etiology, more likely to be tachy mediated as she has been in permanent atrial fibrillation RVR since 2019. Last outpatient visit HR (100s) consistent with rates during admission.  -Would hold lasix  this evening, and reduce to IV lasix  60 mg once daily; will continue to monitor Cr and electrolytes.  -continue Lopressor  25 mg twice daily, will need to consolidate to metoprolol  succinate prior to discharge -start entresto 24/26 mg. -consider SGLT2i in the future, will hold off now given starting entresto  -Was discontinued on spironolactone  in 2024 due to hyperkalemia, could consider adding given hypokalemia during admission. Will hold off for now with other medication changes.    Permanent atrial fibrillation since 2019 DCCV x 1. Declined ablation. Failed/declined AADs. Rate control prior to admission with 240 mg cardizem  which has been stopped due to new reduced EF (see above). Variable heart rate of 70s to 150 with average 100.  She was initiated on Lopressor  12.5 mg BID that was increased to 25 mg BID today.    -Continue on 5 mg Eliquis  twice daily, patient's weight is borderline (61kg), monitor for possible need to reduce dose in the future -continue Lopressor  25 mg twice daily, will need to consolidate to metoprolol  succinate prior to discharge.  Hypertension Has been normotensive/mildly elevated during this admission.  - Continue Lopressor  25 mg twice daily, see above.  -start entresto 24/26 mg, monitor K and Cr   Abdominal aortic aneurysm Pursuing conservative management due to age and size of AAA.   5. Hypokalemia Was repleted and is 4.1 today.  6. Hyponatremia 132 today. Would caution repletion with IV fluids given patient is currently having an acute on chronic systolic and diastolic heart failure exacerbation we will continue to monitor.   Risk Assessment/Risk Scores:        New [place]  Heart Association (NYHA) Functional Class NYHA Class III  CHA2DS2-VASc Score = 6   This indicates a 9.7% annual risk of stroke. The patient's score is based upon: CHF History: 1 HTN History: 1 Diabetes History: 0 Stroke History: 0 Vascular Disease History: 1 Age Score: 2 Gender Score: 1        For questions or updates, please contact [facility] Please consult [email or web address] for contact info under    Signed, Mabel Savage, PA-C  11/23/2023 3:14 PM   Attending Note:   The patient was seen and examined.  Agree with assessment and plan as noted above.  Changes made to the above note as needed.  Patient seen and independently examined with Willis Harter, PA .   We discussed all aspects of the encounter. I agree with the assessment and plan as stated above.     Acute on chronic Combined CHF :   her LVEF has decreased over the past several years .   Likely from her permanent Afib with possible RVR .  She has been diuresed and appears to be fairly euvolemic at this point .   Will hold lasix ,  start Entresto 24 -26 mg BID tonight and follow her weights / exam to monitor for volume overload .   She has been tried on spironolactone  in the past but became hyperkalemic .   We could consider trying a lower dose if needed.   Another options is Farxiga or Jardiance   Will continue with rate control using metoprolol  ( and later will consolidate to Toprol  XL) instead of Diltiazem ,   2.  Atrial fib:  she is on a rate control / anticoagulation  straetgy.   HR is a bit tachy.  She was just started on metoprolol  25 BID a day or so ago.   Will see what her HR is tomorrow and consider increasing it tomorrow if she is still tachycardic .       I have spent a total of 40 minutes with patient reviewing hospital  notes , telemetry, EKGs, labs and examining patient as well as establishing an assessment and plan that was discussed with the patient.  > 50% of time was spent in  direct patient care.    [place], Marieta Shorten., MD, [facility] 11/23/2023, 3:39 PM [address]  [address] Office [phone] Pager [phone]

## 2023-11-23 NOTE — Evaluation (Signed)
 Occupational Therapy Evaluation Patient Details Name: Whitney Burton MRN: [id number] DOB: 1933-01-19 Today's Date: 11/23/2023   History of Present Illness   89 year old ILF resident  presented to [facility] 11/21/23 with complaints of worsening shortness of breath. BNP>3000; afib RVR PMH-a-fib, anxiety, breast cancer, HTN, CHF, PAF on Eliquis      Clinical Impressions PTA pt lives in an ILF at [facility] and has a PCA 3x/wk 2 hrs/day who assists with appointments and IADL tasks. Uses a rollator for mobility. HR increased into the 150s with short distance ambulation @ 25 ft with 1/4 DOE; SpO2 above 90. Pt overall S for ADL tasks @ RW level. Acute OT to follow however do not anticipate the need for OT after DC.      If plan is discharge home, recommend the following:   Assistance with cooking/housework;Assist for transportation     Functional Status Assessment   Patient has had a recent decline in their functional status and demonstrates the ability to make significant improvements in function in a reasonable and predictable amount of time.     Equipment Recommendations   None recommended by OT     Recommendations for Other Services         Precautions/Restrictions   Precautions Precautions: Fall Precaution/Restrictions Comments: reports fell Aug 2024     Mobility Bed Mobility               General bed mobility comments: OOB in chair    Transfers Overall transfer level: Needs assistance Equipment used: Rolling walker (2 wheels) Transfers: Sit to/from Stand Sit to Stand: Supervision                  Balance Overall balance assessment: Mild deficits observed, not formally tested, History of Falls                                         ADL either performed or assessed with clinical judgement   ADL Overall ADL's : Needs assistance/impaired     Grooming: Supervision/safety;Standing   Upper Body Bathing: Set up;Sitting    Lower Body Bathing: Supervison/ safety;Sit to/from stand   Upper Body Dressing : Set up;Sitting   Lower Body Dressing: Supervision/safety;Sit to/from stand   Toilet Transfer: Supervision/safety;Ambulation   Toileting- Clothing Manipulation and Hygiene: Supervision/safety;Sit to/from stand       Functional mobility during ADLs: Supervision/safety;Rolling walker (2 wheels)       Vision Baseline Vision/History: 1 Wears glasses Vision Assessment?: Wears glasses for reading     Perception         Praxis         Pertinent Vitals/Pain Pain Assessment Pain Assessment: No/denies pain     Extremity/Trunk Assessment Upper Extremity Assessment Upper Extremity Assessment: Overall WFL for tasks assessed   Lower Extremity Assessment Lower Extremity Assessment: Defer to PT evaluation   Cervical / Trunk Assessment Cervical / Trunk Assessment: Kyphotic   Communication Communication Communication: No apparent difficulties   Cognition Arousal: Alert Behavior During Therapy: WFL for tasks assessed/performed Cognition: No apparent impairments                               Following commands: Intact       Cueing  General Comments   Cueing Techniques: Verbal cues  HR @ rest 97- 111; HR with ambulation into the  150s   Exercises     Shoulder Instructions      Home Living Family/patient expects to be discharged to:: Private residence Living Arrangements: Alone Available Help at Discharge: Personal care attendant (aide 3x/wk x 2 hrs- helps with appointments and assists with IADLtasksasneeded) Type of Home: Independent living facility Home Access: Elevator     Home Layout: One level     Bathroom Shower/Tub: Producer, television/film/video: Standard Bathroom Accessibility: Yes How Accessible: Accessible via wheelchair Home Equipment: Grab bars - toilet;Grab bars - tub/shower;Rollator (4 wheels);Shower seat - built in;Hand held shower head           Prior Functioning/Environment Prior Level of Function : Independent/Modified Independent             Mobility Comments: uses rollatorat all times ADLs Comments: aide drives her to her MD appointments and does housework;pt doesher own medication management    OT Problem List: Decreased activity tolerance   OT Treatment/Interventions: Self-care/ADL training;Therapeutic exercise;Energy conservation;DME and/or AE instruction;Therapeutic activities;Patient/family education      OT Goals(Current goals can be found in the care plan section)   Acute Rehab OT Goals Patient Stated Goal: to be able to fix her breakfast/lunch adn walk to her dining room OT Goal Formulation: With patient Time For Goal Achievement: 12/07/23 Potential to Achieve Goals: Good   OT Frequency:  Min 2X/week    Co-evaluation              AM-PAC OT "6 Clicks" Daily Activity     Outcome Measure Help from another person eating meals?: None Help from another person taking care of personal grooming?: A Little Help from another person toileting, which includes using toliet, bedpan, or urinal?: A Little Help from another person bathing (including washing, rinsing, drying)?: A Little Help from another person to put on and taking off regular upper body clothing?: A Little Help from another person to put on and taking off regular lower body clothing?: A Little 6 Click Score: 19   End of Session Equipment Utilized During Treatment: Gait belt;Rolling walker (2 wheels) Nurse Communication: Mobility status  Activity Tolerance: Patient tolerated treatment well Patient left: in chair;with call bell/phone within reach  OT Visit Diagnosis: Muscle weakness (generalized) (M62.81)                Time: 5621-3086 OT Time Calculation (min): 20 min Charges:  OT General Charges $OT Visit: 1 Visit OT Evaluation $OT Eval Low Complexity: 1 Low Milburn Aliment, OT/L   Acute OT Clinical Specialist Acute Rehabilitation  Services Pager [phone] Office [phone]   [facility] 11/23/2023, 4:15 PM

## 2023-11-23 NOTE — TOC CM/SW Note (Signed)
 Transition of Care [facility]) - Inpatient Brief Assessment   Patient Details  Name: Simona Rocque MRN: [id number] Date of Birth: May 26, 1933  Transition of Care [facility]) CM/SW Contact:    Juliane Och, LCSW Phone Number: 11/23/2023, 8:51 AM   Clinical Narrative:  8:51 AM Per chart review, patient resides at [facility]. Patient has a PCP and insurance. Patient does not have HH/DME history. Patient has SNF history at [facility]. Patient's preferred pharmacy's are [id number] in [place] and [facility]. No TOC needs were identified at this time. TOC will continue to follow and be available to assist.  Transition of Care Asessment: Insurance and Status: Insurance coverage has been reviewed Patient has primary care physician: Yes Home environment has been reviewed: [facility] Prior level of function:: N/A Prior/Current Home Services: No current home services Social Drivers of Health Review: SDOH reviewed no interventions necessary Readmission risk has been reviewed: Yes Transition of care needs: no transition of care needs at this time

## 2023-11-23 NOTE — Progress Notes (Signed)
 PT Cancellation Note  Patient Details Name: Whitney Burton MRN: [id number] DOB: Apr 08, 1933   Cancelled Treatment:    Reason Eval/Treat Not Completed: Patient not medically ready  Patient up in chair with HR up to 137 at rest. RN aware and plans to give metoprolol . Will try to work with her later if HR controlled.    Gayle Kava, PT Acute Rehabilitation Services  Office [phone]  [place] Leep 11/23/2023, 8:56 AM

## 2023-11-23 NOTE — Evaluation (Signed)
 Physical Therapy Evaluation Patient Details Name: Whitney Burton MRN: [id number] DOB: February 15, 1933 Today's Date: 11/23/2023  History of Present Illness  89 year old ILF resident  presented to [facility] 11/21/23 with complaints of worsening shortness of breath. BNP>3000; afib RVR PMH-a-fib, anxiety, breast cancer, HTN, CHF, PAF on Eliquis   Clinical Impression  Pt admitted secondary to problem above with deficits below. PTA patient lives alone in ILF. She walks to dining room for one meal per day using her rollator (which she uses at all times).  Pt currently limited by elevated HR to 151 bpm after walking 60 ft with RW and CGA. Denied dizziness or shortness of breath.  Anticipate patient will benefit from PT to address problems listed below. Will continue to follow acutely to maximize functional mobility, independence, and safety.  Anticipate no followup PT needs on discharge.          If plan is discharge home, recommend the following:     Can travel by private vehicle        Equipment Recommendations None recommended by PT  Recommendations for Other Services  OT consult    Functional Status Assessment Patient has had a recent decline in their functional status and demonstrates the ability to make significant improvements in function in a reasonable and predictable amount of time.     Precautions / Restrictions Precautions Precautions: Fall Precaution/Restrictions Comments: reports fell Aug 2024      Mobility  Bed Mobility Overal bed mobility: Modified Independent             General bed mobility comments: HOB 20, rail    Transfers Overall transfer level: Needs assistance Equipment used: Rolling walker (2 wheels) Transfers: Sit to/from Stand Sit to Stand: Supervision           General transfer comment: cues for proper hand placement    Ambulation/Gait Ambulation/Gait assistance: Contact guard assist Gait Distance (Feet): 60 Feet Assistive device: Rolling  walker (2 wheels) Gait Pattern/deviations: Step-through pattern, WFL(Within Functional Limits) Gait velocity: requested pt slow down as her HR began to elevate Gait velocity interpretation: 1.31 - 2.62 ft/sec, indicative of limited community ambulator   General Gait Details: elevated HR limiting distance  Stairs            Wheelchair Mobility     Tilt Bed    Modified Rankin (Stroke Patients Only)       Balance Overall balance assessment: Mild deficits observed, not formally tested, History of Falls                                           Pertinent Vitals/Pain Pain Assessment Pain Assessment: No/denies pain    Home Living Family/patient expects to be discharged to:: Private residence Living Arrangements: Alone Available Help at Discharge: Personal care attendant (aide 3x/wk x 2 hrs) Type of Home: Independent living facility Home Access: Elevator       Home Layout: One level Home Equipment: Grab bars - toilet;Grab bars - tub/shower;Rollator (4 wheels);Shower seat - built in      Prior Function Prior Level of Function : Independent/Modified Independent             Mobility Comments: uses rollatorat all times ADLs Comments: aide drives her to her MD appointments and does housework     Extremity/Trunk Assessment   Upper Extremity Assessment Upper Extremity Assessment: Defer to OT evaluation  Lower Extremity Assessment Lower Extremity Assessment: Overall WFL for tasks assessed    Cervical / Trunk Assessment Cervical / Trunk Assessment: Kyphotic  Communication   Communication Communication: No apparent difficulties    Cognition Arousal: Alert Behavior During Therapy: WFL for tasks assessed/performed   PT - Cognitive impairments: No apparent impairments                         Following commands: Intact       Cueing Cueing Techniques: Verbal cues     General Comments General comments (skin integrity, edema,  etc.): HR at rest 96; initially with gait 110s, after 30 ft 122; on return to room up to 151 bpm; denied shortness of breath    Exercises     Assessment/Plan    PT Assessment Patient needs continued PT services  PT Problem List Decreased activity tolerance;Decreased balance;Decreased mobility;Decreased knowledge of use of DME;Cardiopulmonary status limiting activity       PT Treatment Interventions DME instruction;Gait training;Functional mobility training;Therapeutic activities;Therapeutic exercise;Balance training;Patient/family education    PT Goals (Current goals can be found in the Care Plan section)  Acute Rehab PT Goals Patient Stated Goal: keep strength up as she lives alone PT Goal Formulation: With patient Time For Goal Achievement: 12/07/23 Potential to Achieve Goals: Good    Frequency Min 2X/week     Co-evaluation               AM-PAC PT "6 Clicks" Mobility  Outcome Measure Help needed turning from your back to your side while in a flat bed without using bedrails?: None Help needed moving from lying on your back to sitting on the side of a flat bed without using bedrails?: A Little Help needed moving to and from a bed to a chair (including a wheelchair)?: A Little Help needed standing up from a chair using your arms (e.g., wheelchair or bedside chair)?: A Little Help needed to walk in hospital room?: A Little Help needed climbing 3-5 steps with a railing? : A Little 6 Click Score: 19    End of Session Equipment Utilized During Treatment: Gait belt Activity Tolerance: Treatment limited secondary to medical complications (Comment) (elevated HR) Patient left: in chair;with call bell/phone within reach;Other (comment) (PA) Nurse Communication: Mobility status;Other (comment) (elevated HR) PT Visit Diagnosis: Difficulty in walking, not elsewhere classified (R26.2)    Time: 5621-3086 PT Time Calculation (min) (ACUTE ONLY): 20 min   Charges:   PT  Evaluation $PT Eval Low Complexity: 1 Low   PT General Charges $$ ACUTE PT VISIT: 1 Visit          Gayle Kava, PT Acute Rehabilitation Services  Office [phone]   [place] Leep 11/23/2023, 2:28 PM

## 2023-11-24 DIAGNOSIS — I5023 Acute on chronic systolic (congestive) heart failure: Secondary | ICD-10-CM | POA: Diagnosis not present

## 2023-11-24 DIAGNOSIS — I5043 Acute on chronic combined systolic (congestive) and diastolic (congestive) heart failure: Secondary | ICD-10-CM | POA: Diagnosis not present

## 2023-11-24 DIAGNOSIS — I4891 Unspecified atrial fibrillation: Secondary | ICD-10-CM

## 2023-11-24 LAB — BASIC METABOLIC PANEL WITH GFR
Anion gap: 8 (ref 5–15)
BUN: 22 mg/dL (ref 8–23)
CO2: 27 mmol/L (ref 22–32)
Calcium: 9.7 mg/dL (ref 8.9–10.3)
Chloride: 99 mmol/L (ref 98–111)
Creatinine, Ser: 0.72 mg/dL (ref 0.44–1.00)
GFR, Estimated: >60 mL/min (ref >60)
Glucose, Bld: 104 mg/dL — ABNORMAL HIGH (ref 70–99)
Potassium: 4.8 mmol/L (ref 3.5–5.1)
Sodium: 134 mmol/L — ABNORMAL LOW (ref 135–145)

## 2023-11-24 MED ORDER — DIGOXIN 125 MCG PO TABS
0.2500 mg | ORAL_TABLET | Freq: Three times a day (TID) | ORAL | Status: AC
  Administered 2023-11-24 (×3): 0.25 mg via ORAL
  Filled 2023-11-24 (×3): qty 2

## 2023-11-24 MED ORDER — CITALOPRAM HYDROBROMIDE 20 MG PO TABS: 20.0000 mg | ORAL_TABLET | Freq: Every day | ORAL | Status: DC

## 2023-11-24 MED ORDER — DIGOXIN 125 MCG PO TABS
0.1250 mg | ORAL_TABLET | Freq: Every day | ORAL | Status: DC
  Administered 2023-11-25 – 2023-11-26 (×2): 0.125 mg via ORAL
  Filled 2023-11-24 (×3): qty 1

## 2023-11-24 NOTE — Progress Notes (Signed)
 Whitney Burton  NWG:[id number] DOB: 05-Aug-1932 DOA: 11/21/2023 PCP: Sun, Vyvyan, MD    Brief Narrative:  89 year old ILF resident with a history of grade 3 diastolic congestive heart failure, HTN, and permanent atrial fibrillation on Eliquis  who presented to [facility] 11/21/23 with complaints of worsening shortness of breath despite using her prescribed torsemide  as directed.  She had not noticed any major weight gain, but had appreciated significant increased swelling of her legs.  Her dyspnea was worse on exertion and with attempts to lay flat.  She denied chest pain.  She appeared to be volume overloaded on exam.  EKG revealed only atrial fibrillation with controlled heart rate.  CXR revealed no evidence of significant infiltrate or edema.  BNP was noted to be >3000.  Goals of Care:   Code Status: Full Code   DVT prophylaxis:  apixaban  (ELIQUIS ) tablet 5 mg   Interim Hx: No acute events recorded overnight.  Afebrile.  Heart rate appears well-controlled with a range of 68-96.  Blood pressure stable.  Oxygen saturation 94% room air.  Patient reports ongoing episodes of tachypalpitations associated with significant shortness of breath.  This is particularly worse with exertion.  She denies any new complaints today.  Overall however she does not feel she has improved much since admission thus far.  Assessment & Plan:  Acute exacerbation of chronic diastolic congestive heart - newly diagnosed systolic CHF likely related to uncontrolled rate in setting of PAF -diuresed to the point of euvolemia, net negative approximately 1.2 L since admission- TTE this admit noted EF 30-35% with indeterminate diastolic parameters -TTE February 2023 noted EF 55-60% with grade 3 diastolic dysfunction with restrictive physiology as well as severely dilated left and right atria - discontinued CCB given newly appreciated significant systolic failure -Cardiology following and directing GDMT  Filed Weights    11/22/23 0500 11/23/23 0514 11/24/23 0445  Weight: 61.6 kg 61.8 kg 62.3 kg      Chronic atrial fibrillation on Eliquis  w/ acute RVR Suspect paroxysms of RVR are to blame for her acute CHF exac - added BB and stopped CCB -digoxin being added by cardiology today   HTN Blood pressure presently well-controlled  Mild hypokalemia Due to diuresis -resolved with supplementation -holding further diuresis for now   Family Communication: No family present at time of exam today Disposition: Anticipate return to her independent living facility when clinically stable   Objective: Blood pressure 115/73, pulse 68, temperature (!) 97.4 F (36.3 C), temperature source Axillary, resp. rate 20, height 4\' 11"  (1.499 m), weight 62.3 kg, SpO2 95%.  Intake/Output Summary (Last 24 hours) at 11/24/2023 0806 Last data filed at 11/24/2023 0747 Gross per 24 hour  Intake 537 ml  Output 700 ml  Net -163 ml   Filed Weights   11/22/23 0500 11/23/23 0514 11/24/23 0445  Weight: 61.6 kg 61.8 kg 62.3 kg    Examination: General: No acute respiratory distress at the time of my visit Lungs: Clear to auscultation bilaterally -no wheezing Cardiovascular: Irregularly irregular with rate controlled -no murmur or rub Abdomen: NT/ND, soft, BS positive, no rebound Extremities: Trace bilateral lower extremity edema  CBC: Recent Labs  Lab 11/21/23 1324 11/23/23 0209  WBC 8.0 8.5  NEUTROABS 5.1  --   HGB 15.6* 16.0*  HCT 45.1 46.3*  MCV 95.3 94.3  PLT 296 298   Basic Metabolic Panel: Recent Labs  Lab 11/22/23 0206 11/23/23 0209 11/24/23 0227  NA 136 132* 134*  K 3.4* 4.1 4.8  CL 97* 97* 99  CO2 28 26 27   GLUCOSE 98 95 104*  BUN 13 19 22   CREATININE 0.71 0.90 0.72  CALCIUM 9.3 9.5 9.7  MG 1.9 1.9  --    GFR: Estimated Creatinine Clearance: 39 mL/min (by C-G formula based on SCr of 0.72 mg/dL).   Scheduled Meds:  apixaban   5 mg Oral BID   citalopram   20 mg Oral Daily   lidocaine   1 patch  Transdermal Q24H   metoprolol  tartrate  25 mg Oral BID   sacubitril-valsartan  1 tablet Oral BID   sodium chloride  flush  3 mL Intravenous Q12H     LOS: 3 days   Abbe Abate, MD [facility] Office  [phone] Pager - Text Page per [place] Foley  If 7PM-7AM, please contact night-coverage per Amion 11/24/2023, 8:06 AM

## 2023-11-24 NOTE — Plan of Care (Signed)
  Problem: Education: Goal: Knowledge of General Education information will improve Description: Including pain rating scale, medication(s)/side effects and non-pharmacologic comfort measures Outcome: Progressing   Problem: Clinical Measurements: Goal: Will remain free from infection Outcome: Progressing Goal: Respiratory complications will improve Outcome: Progressing   Problem: Nutrition: Goal: Adequate nutrition will be maintained Outcome: Progressing   Problem: Elimination: Goal: Will not experience complications related to bowel motility Outcome: Progressing Goal: Will not experience complications related to urinary retention Outcome: Progressing   Problem: Safety: Goal: Ability to remain free from injury will improve Outcome: Progressing   Problem: Skin Integrity: Goal: Risk for impaired skin integrity will decrease Outcome: Progressing

## 2023-11-24 NOTE — Plan of Care (Signed)

## 2023-11-24 NOTE — Progress Notes (Signed)
  Progress Note  Patient Name: Whitney Burton Date of Encounter: 11/24/2023 [facility] Cardiologist: Oneil Bigness, MD    Interval Summary   89 yo with permanent Afib,  newly diagnosed HFrEF  We stopped Diltiazem  and started her on eliquis  and metoprolol  during this admission  HR remains a bit elevated. Will load with digoxin and start her on Digoxin 0.125 mg a day tomorrow to help with HR       Vital Signs Vitals:   11/23/23 2254 11/24/23 0445 11/24/23 0745 11/24/23 0859  BP: 104/69 (!) 126/98 115/73 100/69  Pulse: 74 78 68 (!) 118  Resp: 19 20 20    Temp: (!) 97.5 F (36.4 C) 97.6 F (36.4 C) (!) 97.4 F (36.3 C)   TempSrc: Oral Oral Axillary   SpO2: 94% 95% 95%   Weight:  62.3 kg    Height:        Intake/Output Summary (Last 24 hours) at 11/24/2023 0935 Last data filed at 11/24/2023 0747 Gross per 24 hour  Intake 537 ml  Output 700 ml  Net -163 ml      11/24/2023    4:45 AM 11/23/2023    5:14 AM 11/22/2023    5:00 AM  Last 3 Weights  Weight (lbs) 137 lb 5.6 oz 136 lb 3.9 oz 135 lb 14.4 oz  Weight (kg) 62.3 kg 61.8 kg 61.644 kg      Telemetry/ECG  Afib with RVR  - Personally Reviewed  Physical Exam  GEN: No acute distress.   Neck: No JVD Cardiac: irreg. Irreg.  Respiratory: Clear to auscultation bilaterally. GI: Soft, nontender, non-distended  MS: No edema  Assessment & Plan   Acute on CHronic HFrEF .    Continue entresto .   Her reduced LVEF may be due  to ( or exacerbated by) her Afib with RVR .  Will continue to work on slowing her HR  BP is 100/60 on metoprolol  and entresto Will add Digoxin   2.   Atrial fib ;  HR is still tachy  Cont metoprolol ,  Add digoxin           For questions or updates, please contact [facility] Please consult [email or web address] for contact info under       Signed, Ahmad Alert, MD

## 2023-11-24 NOTE — Progress Notes (Signed)
 Occupational Therapy Treatment Patient Details Name: Whitney Burton MRN: [id number] DOB: Feb 05, 1933 Today's Date: 11/24/2023   History of present illness 89 year old ILF resident  presented to [facility] 11/21/23 with complaints of worsening shortness of breath. BNP>3000; afib RVR PMH-a-fib, anxiety, breast cancer, HTN, CHF, PAF on Eliquis    OT comments  Patient received in recliner and stating Whitney Burton felt out of breath earlier but feeling better now. Patient making good gains with OT treatment with patient able to perform mobility and item retrieval in room with rollator to simulate meal prep with supervision. Patient performed mobility in hallway with rollator and supervision with cues for brake use. Handout provided to patient on energy conservation strategies and reviewed with patient. Discharge recommendations continue to be appropriate. Acute OT to continue to follow to address established goals.       If plan is discharge home, recommend the following:  Assistance with cooking/housework;Assist for transportation   Equipment Recommendations  None recommended by OT    Recommendations for Other Services      Precautions / Restrictions Precautions Precautions: Fall Precaution/Restrictions Comments: reports fell Aug 2024 Restrictions Weight Bearing Restrictions Per Provider Order: No       Mobility Bed Mobility Overal bed mobility: Modified Independent             General bed mobility comments: OOB in chair    Transfers Overall transfer level: Needs assistance Equipment used: Rollator (4 wheels) Transfers: Sit to/from Stand Sit to Stand: Supervision           General transfer comment: cues for brake use with rollator     Balance Overall balance assessment: Mild deficits observed, not formally tested, History of Falls                                         ADL either performed or assessed with clinical judgement   ADL Overall ADL's :  Needs assistance/impaired                     Lower Body Dressing: Supervision/safety;Sit to/from stand Lower Body Dressing Details (indicate cue type and reason): able to doff socks and donn shoes Toilet Transfer: Supervision/safety;Ambulation             General ADL Comments: item retrieval with rollator in room with supervision    Extremity/Trunk Assessment              Vision       Perception     Praxis     Communication Communication Communication: No apparent difficulties   Cognition Arousal: Alert Behavior During Therapy: WFL for tasks assessed/performed Cognition: No apparent impairments                               Following commands: Intact        Cueing   Cueing Techniques: Verbal cues  Exercises      Shoulder Instructions       General Comments SpO2 94-96% on RA, HR 102-118, BP 128/78 (89)    Pertinent Vitals/ Pain       Pain Assessment Pain Assessment: No/denies pain  Home Living  Prior Functioning/Environment              Frequency  Min 2X/week        Progress Toward Goals  OT Goals(current goals can now be found in the care plan section)  Progress towards OT goals: Progressing toward goals  Acute Rehab OT Goals Patient Stated Goal: to go home OT Goal Formulation: With patient Time For Goal Achievement: 12/07/23 Potential to Achieve Goals: Good ADL Goals Pt Will Transfer to Toilet: with modified independence;ambulating Pt Will Perform Toileting - Clothing Manipulation and hygiene: with modified independence;sit to/from stand Additional ADL Goal #1: Pt will independently verbalize 3 energy conservation strategies to increase independence with ADL tasks Additional ADL Goal #2: Pt will verbalize 2 warning signs/symptoms of CHF exacerbation to reduce risk of readmission Additional ADL Goal #3: Pt will independently use rollator in simulated  meal prep task to facilitate safe DC home  Plan      Co-evaluation                 AM-PAC OT "6 Clicks" Daily Activity     Outcome Measure   Help from another person eating meals?: None Help from another person taking care of personal grooming?: A Little Help from another person toileting, which includes using toliet, bedpan, or urinal?: A Little Help from another person bathing (including washing, rinsing, drying)?: A Little Help from another person to put on and taking off regular upper body clothing?: A Little Help from another person to put on and taking off regular lower body clothing?: A Little 6 Click Score: 19    End of Session Equipment Utilized During Treatment: Gait belt;Rollator (4 wheels)  OT Visit Diagnosis: Muscle weakness (generalized) (M62.81)   Activity Tolerance Patient tolerated treatment well   Patient Left in chair;with call bell/phone within reach   Nurse Communication Mobility status        Time: 4098-1191 OT Time Calculation (min): 24 min  Charges: OT General Charges $OT Visit: 1 Visit OT Treatments $Self Care/Home Management : 8-22 mins $Therapeutic Activity: 8-22 mins  Anitra Barn, OTA Acute Rehabilitation Services  Office [phone]   Jovita Nipper 11/24/2023, 2:36 PM

## 2023-11-24 NOTE — Care Management Important Message (Signed)
 Important Message  Patient Details  Name: Whitney Burton MRN: [id number] Date of Birth: 1932/10/23   Important Message Given:  Yes - Medicare IM     Wynonia Hedges 11/24/2023, 2:09 PM

## 2023-11-24 NOTE — Progress Notes (Signed)
 Patient request something for mild headache unrelieved by tylenol  and would like something to help her sleep.  Dr. [facility] Lavender paged.

## 2023-11-24 NOTE — Progress Notes (Signed)
 Mobility Specialist Progress Note;   11/24/23 0936  Mobility  Activity Refused mobility  Mobility Specialist Start Time (ACUTE ONLY) [phone]   RN deferred at this time d/t increased HR. Will f/u this afternoon as able.   Janit Meline Mobility Specialist Please contact via SecureChat or [employer] [phone]

## 2023-11-25 DIAGNOSIS — I10 Essential (primary) hypertension: Secondary | ICD-10-CM | POA: Diagnosis not present

## 2023-11-25 DIAGNOSIS — I5023 Acute on chronic systolic (congestive) heart failure: Secondary | ICD-10-CM | POA: Diagnosis not present

## 2023-11-25 DIAGNOSIS — I4821 Permanent atrial fibrillation: Secondary | ICD-10-CM | POA: Diagnosis not present

## 2023-11-25 DIAGNOSIS — E876 Hypokalemia: Secondary | ICD-10-CM

## 2023-11-25 DIAGNOSIS — F32A Depression, unspecified: Secondary | ICD-10-CM

## 2023-11-25 DIAGNOSIS — I4891 Unspecified atrial fibrillation: Secondary | ICD-10-CM | POA: Diagnosis not present

## 2023-11-25 NOTE — Assessment & Plan Note (Signed)
Continue with citalopram 

## 2023-11-25 NOTE — Plan of Care (Signed)
   Problem: Education: Goal: Knowledge of General Education information will improve Description Including pain rating scale, medication(s)/side effects and non-pharmacologic comfort measures Outcome: Progressing   Problem: Health Behavior/Discharge Planning: Goal: Ability to manage health-related needs will improve Outcome: Progressing

## 2023-11-25 NOTE — Progress Notes (Addendum)
 Physical Therapy Treatment Patient Details Name: Whitney Burton MRN: [id number] DOB: Apr 16, 1933 Today's Date: 11/25/2023   History of Present Illness 89 year old ILF resident  presented to [facility] 11/21/23 with complaints of worsening shortness of breath. BNP>3000; afib RVR PMH-a-fib, anxiety, breast cancer, HTN, CHF, PAF on Eliquis     PT Comments  Pt seated up in chair on arrival and agreeable to session with encouragement, as pt presenting with some anxiousness in anticipation of SOB with mobility. Pt demonstrating transfers and gait with rollator support with grossly CGA for safety with mild DOE 2/4 noted. Pt eager to improve endurance and receptive to education on importance of continued mobility and appropriate activity progression and importance of mobility between therapies with pt verbalizing understanding and will to ambulate with staff again in PM. Pt continues to benefit from skilled PT services to progress toward functional mobility goals.     If plan is discharge home, recommend the following:     Can travel by private vehicle        Equipment Recommendations  None recommended by PT    Recommendations for Other Services OT consult     Precautions / Restrictions Precautions Precautions: Fall Precaution/Restrictions Comments: reports fell Aug 2024 Restrictions Weight Bearing Restrictions Per Provider Order: No     Mobility  Bed Mobility Overal bed mobility: Modified Independent             General bed mobility comments: OOB in chair    Transfers Overall transfer level: Needs assistance Equipment used: Rollator (4 wheels) Transfers: Sit to/from Stand Sit to Stand: Supervision           General transfer comment: cues for brake use with rollator    Ambulation/Gait Ambulation/Gait assistance: Contact guard assist Gait Distance (Feet): 160 Feet Assistive device: Rolling walker (2 wheels) Gait Pattern/deviations: Step-through pattern, WFL(Within  Functional Limits) Gait velocity: decr     General Gait Details: steady but gaurded gait, no overt LOB   Stairs             Wheelchair Mobility     Tilt Bed    Modified Rankin (Stroke Patients Only)       Balance Overall balance assessment: Mild deficits observed, not formally tested, History of Falls                                          Communication Communication Communication: No apparent difficulties  Cognition Arousal: Alert Behavior During Therapy: WFL for tasks assessed/performed   PT - Cognitive impairments: No apparent impairments                         Following commands: Intact      Cueing Cueing Techniques: Verbal cues  Exercises General Exercises - Lower Extremity Hip Flexion/Marching: AROM, Both, 10 reps, Seated (encouraged pt to perform throughout day x30 seconds for increased endurance)    General Comments General comments (skin integrity, edema, etc.): SpO2 91-95% on RA when good pleth available, HR 89-112bpm      Pertinent Vitals/Pain Pain Assessment Pain Assessment: No/denies pain    Home Living                          Prior Function            PT Goals (current goals can now be found  in the care plan section) Acute Rehab PT Goals Patient Stated Goal: keep strength up as she lives alone PT Goal Formulation: With patient Time For Goal Achievement: 12/07/23 Progress towards PT goals: Progressing toward goals    Frequency    Min 2X/week      PT Plan      Co-evaluation              AM-PAC PT 6 Clicks Mobility   Outcome Measure  Help needed turning from your back to your side while in a flat bed without using bedrails?: None Help needed moving from lying on your back to sitting on the side of a flat bed without using bedrails?: A Little Help needed moving to and from a bed to a chair (including a wheelchair)?: A Little Help needed standing up from a chair using your  arms (e.g., wheelchair or bedside chair)?: A Little Help needed to walk in hospital room?: A Little Help needed climbing 3-5 steps with a railing? : A Little 6 Click Score: 19    End of Session Equipment Utilized During Treatment: Gait belt Activity Tolerance: Patient tolerated treatment well Patient left: in chair;with call bell/phone within reach Nurse Communication: Mobility status PT Visit Diagnosis: Difficulty in walking, not elsewhere classified (R26.2)     Time: 0981-1914 PT Time Calculation (min) (ACUTE ONLY): 16 min  Charges:    $Gait Training: 8-22 mins PT General Charges $$ ACUTE PT VISIT: 1 Visit                     Beverly Buckler. PTA Acute Rehabilitation Services Office: [phone]   Agapito Horseman 11/25/2023, 9:58 AM

## 2023-11-25 NOTE — Assessment & Plan Note (Addendum)
 Continue rate control with metoprolol  and digoxin  Telemetry with atrial fibrillation rhythm 100 bpm, range.  Anticoagulation with apixaban .  06/13 digoxin  level 1,0

## 2023-11-25 NOTE — Plan of Care (Signed)

## 2023-11-25 NOTE — Hospital Course (Addendum)
 Whitney Burton was admitted to the hospital with the working diagnosis of heart failure exacerbation.   89 year old independent living facility resident with a history of congestive heart failure, HTN, and permanent atrial fibrillation who presented to [facility] 11/21/23 with complaints of worsening shortness of breath despite using her prescribed torsemide  as directed.  Positive dyspnea on exertion, orthopnea and lower extremity edema.  On her initial physical examination her blood pressure was 139/79, HR 58, RR 32 and 02 saturation 95%  Lungs with no wheezing or rhonchi, heart with S1 and S2 present irregularly irregular with no gallops, rubs or murmurs, abdomen with no distention and positive lower extremity edema ++.   Na 136, K 3,9 Cl 98 bicarbonate 24 glucose 100 bun 17 cr 0,66 AST 20 ALT 13  BNP 3,177  High sensitive troponin < 15 and < 15  Wbc 8,0 hgb 15.6 plt 296   Chest radiograph with hypoinflation, positive cardiomegaly with fluid in the right fissure, with no infiltrates or effusions.   EKG 129 bpm, normal axis, normal intervals, qtc 445, atrial fibrillation rhythm with poor RR wave progression, no significant ST segment or T wave changes, low voltage.   Patient was placed on furosemide  IV for diuresis. Had intermittent RVR atrial fibrillation, AV blockade was optimize with metoprolol  succinate and digoxin .   06/14 patient with improved volume status, plan for discharge home and follow up as outpatient,.

## 2023-11-25 NOTE — Progress Notes (Addendum)
  Progress Note   Patient: Whitney Burton ZOX:[id number] DOB: [birth year] DOA: 11/21/2023     4 DOS: the patient was seen and examined on 11/25/2023   Brief hospital course: Whitney Burton was admitted to the hospital with the working diagnosis of heart failure exacerbation.   89 year old ILF resident with a history of grade 3 diastolic congestive heart failure, HTN, and permanent atrial fibrillation who presented to [facility] 11/21/23 with complaints of worsening shortness of breath despite using her prescribed torsemide  as directed.  She had not noticed any major weight gain, but had appreciated significant increased swelling of her legs. Her dyspnea was worse on exertion and with attempts to lay flat.  She appeared to be volume overloaded on exam.  EKG revealed only atrial fibrillation with controlled heart rate.  CXR revealed no evidence of significant infiltrate or edema.  BNP was noted to be >3000.   Assessment and Plan: * Acute on chronic systolic CHF (congestive heart failure) (HCC) Echocardiogram with reduced LV systolic function EF 30 to 35%, global hypokinesis, moderate dilatation of internal cavity, RV systolic function with mild reduction with mild enlarged cavity, severe LA and RA dilatation, small pericardial effusion, posterior to the LV, moderate tricuspid valve regurgitation,   Documented urine output is 300 cc Systolic blood pressure 100 mmHg range.   Continue entresto and digoxin/ Metoprolol  low dose.  Holding on diuretic therapy for now.   Permanent atrial fibrillation (HCC) Continue rate control with metoprolol  and digoxin, (had IV loading yesterday)  Telemetry with atrial fibrillation rate 90 to 100   Essential hypertension Continue blood pressure control with Entresto and metoprolol .   Hypokalemia Hyponatremia   Renal function with serum cr at 0,72 with K at 4,8 and serum bicarbonate at 27  Na 134 Mg 1.9   Continue close follow up renal function and  electrolytes Currently off diuretic therapy   Depression Continue with citalopram .    Subjective: Patient is feeling better, no dyspnea or edema, had episodic palpitations, no chest pain or angina   Physical Exam: Vitals:   11/24/23 2239 11/25/23 0629 11/25/23 0719 11/25/23 0836  BP: 96/67 114/78 106/76   Pulse: 90 90 64 (!) 117  Resp: (!) 26 (!) 34 20   Temp: 97.6 F (36.4 C) 97.6 F (36.4 C) 97.6 F (36.4 C)   TempSrc: Oral Oral Oral   SpO2: 96% 91% (!) 87%   Weight:  62.1 kg    Height:       Neurology awake and alert ENT with mild pallor Cardiovascular with S1 and S2 present, irregularly irregular with no gallops rubs or murmurs No JVD No lower extremity edema Respiratory with no rales or wheezing, no rhonchi Abdomen with no distention  Data Reviewed:    Family Communication: no family at the bedside   Disposition: Status is: Inpatient Remains inpatient appropriate because: telemetry monitoring   Planned Discharge Destination: Home     Author: Albertus Alt, MD 11/25/2023 8:49 AM  For on call review [email or web address].

## 2023-11-25 NOTE — Assessment & Plan Note (Addendum)
 Continue metoprolol  and low dose losartan .  Follow up as outpatient.

## 2023-11-25 NOTE — Progress Notes (Signed)
  Progress Note  Patient Name: Whitney Burton Date of Encounter: 11/25/2023 [facility] Cardiologist: Oneil Bigness, MD    Interval Summary   89 yo with permanent Afib,  newly diagnosed HFrEF  We stopped Diltiazem  and started her on eliquis  and metoprolol  during this admission  HR remains a bit elevated. Will load with digoxin and start her on Digoxin 0.125 mg a day tomorrow to help with HR   HR is slower this am . Overall seems to be doing better.  Needs to get up and around     Vital Signs Vitals:   11/24/23 1933 11/24/23 2239 11/25/23 0629 11/25/23 0719  BP: 124/77 96/67 114/78 106/76  Pulse: (!) 115 90 90 64  Resp: 20 (!) 26 (!) 34 20  Temp: (!) 97.4 F (36.3 C) 97.6 F (36.4 C) 97.6 F (36.4 C) 97.6 F (36.4 C)  TempSrc: Oral Oral Oral Oral  SpO2: 90% 96% 91% (!) 87%  Weight:   62.1 kg   Height:        Intake/Output Summary (Last 24 hours) at 11/25/2023 0831 Last data filed at 11/25/2023 9604 Gross per 24 hour  Intake 600 ml  Output 300 ml  Net 300 ml      11/25/2023    6:29 AM 11/24/2023    4:45 AM 11/23/2023    5:14 AM  Last 3 Weights  Weight (lbs) 136 lb 14.4 oz 137 lb 5.6 oz 136 lb 3.9 oz  Weight (kg) 62.097 kg 62.3 kg 61.8 kg      Telemetry/ECG  Afib with RVR  - Personally Reviewed   Physical Exam: Blood pressure 106/76, pulse (!) 117, temperature 97.6 F (36.4 C), temperature source Oral, resp. rate 20, height 4' 11 (1.499 m), weight 62.1 kg, SpO2 (!) 87%.       GEN:  elderly female HEENT: Normal NECK: No JVD; No carotid bruits LYMPHATICS: No lymphadenopathy CARDIAC:  irreg. Irreg.  RESPIRATORY:  Clear to auscultation without rales, wheezing or rhonchi  ABDOMEN: Soft, non-tender, non-distended MUSCULOSKELETAL:  No edema; No deformity  SKIN: Warm and dry NEUROLOGIC:  Alert and oriented x 3   Assessment & Plan   Acute on CHronic HFrEF .    Continue entresto .  Her HR is better controlled.     2.   Atrial fib ;  HR is still  tachy but improving . Cont metoprolol  / digoxin     For questions or updates, please contact [facility] Please consult [email or web address] for contact info under       Signed, Ahmad Alert, MD

## 2023-11-25 NOTE — Assessment & Plan Note (Addendum)
 Hyponatremia   Renal function today with serum cr at 0,62 with K at 4,2 and serum bicarbonate at 22  Na 129   Continue close follow up renal function and electrolytes Holding diuretic therapy for now.

## 2023-11-25 NOTE — Assessment & Plan Note (Addendum)
 Echocardiogram with reduced LV systolic function EF 30 to 35%, global hypokinesis, moderate dilatation of internal cavity, RV systolic function with mild reduction with mild enlarged cavity, severe LA and RA dilatation, small pericardial effusion, posterior to the LV, moderate tricuspid valve regurgitation,   Documented urine output is 100 cc Systolic blood pressure 90 to 161 mmHg range.   Continue metoprolol  succinate and digoxin  If blood pressure continue to be low will use ARB and hold on entresto .  Holding on diuretic therapy for now.

## 2023-11-26 ENCOUNTER — Telehealth (HOSPITAL_COMMUNITY): Payer: Self-pay | Admitting: Pharmacy Technician

## 2023-11-26 ENCOUNTER — Other Ambulatory Visit (HOSPITAL_COMMUNITY): Payer: Self-pay

## 2023-11-26 DIAGNOSIS — I4891 Unspecified atrial fibrillation: Secondary | ICD-10-CM | POA: Diagnosis not present

## 2023-11-26 DIAGNOSIS — I5023 Acute on chronic systolic (congestive) heart failure: Secondary | ICD-10-CM | POA: Diagnosis not present

## 2023-11-26 DIAGNOSIS — I10 Essential (primary) hypertension: Secondary | ICD-10-CM | POA: Diagnosis not present

## 2023-11-26 DIAGNOSIS — I4821 Permanent atrial fibrillation: Secondary | ICD-10-CM | POA: Diagnosis not present

## 2023-11-26 DIAGNOSIS — E876 Hypokalemia: Secondary | ICD-10-CM | POA: Diagnosis not present

## 2023-11-26 LAB — BASIC METABOLIC PANEL WITH GFR
Anion gap: 10 (ref 5–15)
BUN: 22 mg/dL (ref 8–23)
CO2: 24 mmol/L (ref 22–32)
Calcium: 9.4 mg/dL (ref 8.9–10.3)
Chloride: 97 mmol/L — ABNORMAL LOW (ref 98–111)
Creatinine, Ser: 0.65 mg/dL (ref 0.44–1.00)
GFR, Estimated: >60 mL/min (ref >60)
Glucose, Bld: 96 mg/dL (ref 70–99)
Potassium: 4 mmol/L (ref 3.5–5.1)
Sodium: 131 mmol/L — ABNORMAL LOW (ref 135–145)

## 2023-11-26 MED ORDER — METOPROLOL SUCCINATE ER 50 MG PO TB24
50.0000 mg | ORAL_TABLET | Freq: Every day | ORAL | Status: DC
  Administered 2023-11-26: 50 mg via ORAL
  Filled 2023-11-26 (×2): qty 1

## 2023-11-26 MED ORDER — DICLOFENAC SODIUM 1 % EX GEL
2.0000 g | Freq: Four times a day (QID) | CUTANEOUS | Status: DC
  Administered 2023-11-26 (×3): 2 g via TOPICAL
  Filled 2023-11-26: qty 100

## 2023-11-26 NOTE — Progress Notes (Addendum)
 Heart Failure Navigator Progress Note  Assessed for Heart & Vascular TOC clinic readiness.  Patient does not meet criteria due to EF 30-35%, has a scheduled [facility] appointment on 02/03/2024. No HF TOC per Dr. Sunnie England. Reached out to Penelope Bowie to see if her [facility] appointment can be moved up sooner. New appointment scheduled for 12/15/2023.    Navigator will sign off at this time.   Randie Bustle, BSN, Scientist, clinical (histocompatibility and immunogenetics) Only

## 2023-11-26 NOTE — Progress Notes (Signed)
  Progress Note   Patient: Whitney Burton:[id number] DOB: 1933-03-06 DOA: 11/21/2023     5 DOS: the patient was seen and examined on 11/26/2023   Brief hospital course: Whitney Burton was admitted to the hospital with the working diagnosis of heart failure exacerbation.   89 year old ILF resident with a history of congestive heart failure, HTN, and permanent atrial fibrillation who presented to [facility] 11/21/23 with complaints of worsening shortness of breath despite using her prescribed torsemide  as directed.  Positive dyspnea on exertion, orthopnea and lower extremity edema.  On her initial physical examination her blood pressure was 139/79, HR 58, RR 32 and 02 saturation 95%  Lungs with no wheezing or rhonchi, heart with S1 and S2 present irregularly irregular with no gallops, rubs or murmurs, abdomen with no distention and positive lower extremity edema ++.   Na 136, K 3,9 Cl 98 bicarbonate 24 glucose 100 bun 17 cr 0,66 AST 20 ALT 13  BNP 3,177  High sensitive troponin < 15 and < 15  Wbc 8,0 hgb 15.6 plt 296   Chest radiograph with hypoinflation, positive cardiomegaly with fluid in the right fissure, with no infiltrates or effusions.   EKG 129 bpm, normal axis, normal intervals, qtc 445, atrial fibrillation rhythm with poor RR wave progression, no significant ST segment or T wave changes, low voltage.   Assessment and Plan: * Acute on chronic systolic CHF (congestive heart failure) (HCC) Echocardiogram with reduced LV systolic function EF 30 to 35%, global hypokinesis, moderate dilatation of internal cavity, RV systolic function with mild reduction with mild enlarged cavity, severe LA and RA dilatation, small pericardial effusion, posterior to the LV, moderate tricuspid valve regurgitation,   Documented urine output is 550 cc Systolic blood pressure 100 mmHg range.   Continue entresto and digoxin Metoprolol  low dose.  Holding on diuretic therapy for now.   Permanent atrial  fibrillation (HCC) Continue rate control with metoprolol  and digoxin Telemetry with atrial fibrillation rate 90 to 115   Essential hypertension Continue blood pressure control with Entresto and metoprolol .   Hypokalemia Hyponatremia   Pending blood work for today.   Continue close follow up renal function and electrolytes Currently off diuretic therapy  Holding diuretic therapy for now.   Depression Continue with citalopram .     Subjective: Today patient is having neck pain, she reports having dyspnea on exertion, no chest pain, no palpitations   Physical Exam: Vitals:   11/25/23 2316 11/26/23 0405 11/26/23 0554 11/26/23 0733  BP: 114/66 93/64 131/82 108/78  Pulse: 84 (!) 52 83 96  Resp: 20 (!) 22 (!) 26 20  Temp: (!) 97.4 F (36.3 C) (!) 97.4 F (36.3 C) (!) 97.4 F (36.3 C) (!) 97.4 F (36.3 C)  TempSrc: Oral Axillary Oral Oral  SpO2: 93% 93% 94% 94%  Weight:   62.8 kg   Height:       Neurology awake and alert ENT with mild pallor with no icterus Cardiovascular with S1 and S2 present, irregularly irregular with no gallops, rubs or murmurs No JVD No lower extremity edema Respiratory with mild rales at bases with no wheezing or rhonchi  Abdomen with no distention, soft and non tender Data Reviewed:    Family Communication: no family at the bedside    Disposition: Status is: Inpatient Remains inpatient appropriate because: atrial fibrillation   Planned Discharge Destination: Home     Author: Albertus Alt, MD 11/26/2023 9:34 AM  For on call review [email or web address].

## 2023-11-26 NOTE — Telephone Encounter (Signed)
 Patient Product/process development scientist completed.    The patient is insured through [facility]. Patient has Medicare and is not eligible for a copay card, but may be able to apply for patient assistance or Medicare RX Payment Plan (Patient Must reach out to their plan, if eligible for payment plan), if available.    Ran test claim for Entresto 24-26 mg and the current 30 day co-pay is $47.00.  Ran test claim for Eliquis  5 mg mg and was filled for a 90 day supply on 10/13/2023, can be filled again on 12/20/2023  This test claim was processed through [facility]- copay amounts may vary at other pharmacies due to pharmacy/plan contracts, or as the patient moves through the different stages of their insurance plan.     Morgan Arab, CPHT Pharmacy Technician III Certified Patient Advocate [facility] Patient Advocate Team Direct Number: [phone]  Fax: [phone]

## 2023-11-26 NOTE — Progress Notes (Signed)
 Occupational Therapy Treatment Patient Details Name: Whitney Burton MRN: [id number] DOB: 10-19-1932 Today's Date: 11/26/2023   History of present illness 89 year old ILF resident  presented to [facility] 11/21/23 with complaints of worsening shortness of breath. BNP>3000; afib RVR PMH-a-fib, anxiety, breast cancer, HTN, CHF, PAF on Eliquis    OT comments  Patient received in up in recliner and agreeable to OT treatment. Patient was supervision to ambulate late to sink for grooming tasks with rollator walker and required seated rest break following. Patient performed mobility with rollator walker with education on brake use and safety. Upon return to room patient asking to use bathroom and with supervision with rollator but used RW to exit bathroom due to space. Discharge recommendations continue to be appropriate. Acute OT to continue to follow to address established goals.       If plan is discharge home, recommend the following:  Assistance with cooking/housework;Assist for transportation   Equipment Recommendations  None recommended by OT    Recommendations for Other Services      Precautions / Restrictions Precautions Precautions: Fall Precaution/Restrictions Comments: reports fell Aug 2024 Restrictions Weight Bearing Restrictions Per Provider Order: No       Mobility Bed Mobility Overal bed mobility: Modified Independent             General bed mobility comments: OOB in chair    Transfers Overall transfer level: Needs assistance Equipment used: Rollator (4 wheels) Transfers: Sit to/from Stand Sit to Stand: Supervision           General transfer comment: cues for brake use with rollator     Balance Overall balance assessment: Mild deficits observed, not formally tested, History of Falls                                         ADL either performed or assessed with clinical judgement   ADL Overall ADL's : Needs assistance/impaired      Grooming: Wash/dry hands;Wash/dry face;Oral care;Supervision/safety;Standing           Upper Body Dressing : Set up;Sitting Upper Body Dressing Details (indicate cue type and reason): gown to cover back Lower Body Dressing: Supervision/safety;Sit to/from stand   Toilet Transfer: Supervision/safety;Ambulation Toilet Transfer Details (indicate cue type and reason): used rollator to get to bathroom but required HHA to complete due to rollator unable to fit in bathroom, RW used to exit bathroom Toileting- Clothing Manipulation and Hygiene: Modified independent;Sitting/lateral lean       Functional mobility during ADLs: Supervision/safety;Rolling walker (2 wheels)      Extremity/Trunk Assessment              Vision       Perception     Praxis     Communication Communication Communication: No apparent difficulties   Cognition Arousal: Alert Behavior During Therapy: WFL for tasks assessed/performed Cognition: No apparent impairments                               Following commands: Intact        Cueing   Cueing Techniques: Verbal cues  Exercises      Shoulder Instructions       General Comments BP 108/78 (88), HR 102-108 at rest 132-155 during mobility    Pertinent Vitals/ Pain       Pain Assessment Pain Assessment: No/denies  pain  Home Living                                          Prior Functioning/Environment              Frequency  Min 2X/week        Progress Toward Goals  OT Goals(current goals can now be found in the care plan section)  Progress towards OT goals: Progressing toward goals  Acute Rehab OT Goals Patient Stated Goal: to go home OT Goal Formulation: With patient Time For Goal Achievement: 12/07/23 Potential to Achieve Goals: Good ADL Goals Pt Will Transfer to Toilet: with modified independence;ambulating Pt Will Perform Toileting - Clothing Manipulation and hygiene: with modified  independence;sit to/from stand Additional ADL Goal #1: Pt will independently verbalize 3 energy conservation strategies to increase independence with ADL tasks Additional ADL Goal #2: Pt will verbalize 2 warning signs/symptoms of CHF exacerbation to reduce risk of readmission Additional ADL Goal #3: Pt will independently use rollator in simulated meal prep task to facilitate safe DC home  Plan      Co-evaluation                 AM-PAC OT 6 Clicks Daily Activity     Outcome Measure   Help from another person eating meals?: None Help from another person taking care of personal grooming?: A Little Help from another person toileting, which includes using toliet, bedpan, or urinal?: A Little Help from another person bathing (including washing, rinsing, drying)?: A Little Help from another person to put on and taking off regular upper body clothing?: A Little Help from another person to put on and taking off regular lower body clothing?: A Little 6 Click Score: 19    End of Session Equipment Utilized During Treatment: Gait belt;Rollator (4 wheels)  OT Visit Diagnosis: Muscle weakness (generalized) (M62.81)   Activity Tolerance Patient tolerated treatment well   Patient Left in chair;with call bell/phone within reach   Nurse Communication Mobility status        Time: 1610-9604 OT Time Calculation (min): 24 min  Charges: OT General Charges $OT Visit: 1 Visit OT Treatments $Self Care/Home Management : 8-22 mins $Therapeutic Activity: 8-22 mins  Whitney Burton, OTA Acute Rehabilitation Services  Office [phone]   Whitney Burton 11/26/2023, 12:04 PM

## 2023-11-26 NOTE — Progress Notes (Addendum)
 Progress Note  Patient Name: Whitney Burton Date of Encounter: 11/26/2023 [facility] Cardiologist: Oneil Bigness, MD   Interval Summary   Patient resting comfortably in bedside chair. Denies focal dyspnea today though states hard to really know without ambulation. Is not feeling any palpitations or other symptoms with her afib.   Vital Signs Vitals:   11/26/23 0554 11/26/23 0733 11/26/23 1007 11/26/23 1135  BP: 131/82 108/78 (!) 114/57 103/61  Pulse: 83 96 (!) 127 67  Resp: (!) 26 20  20   Temp: (!) 97.4 F (36.3 C) (!) 97.4 F (36.3 C)  (!) 97.5 F (36.4 C)  TempSrc: Oral Oral  Oral  SpO2: 94% 94%  97%  Weight: 62.8 kg     Height:        Intake/Output Summary (Last 24 hours) at 11/26/2023 1213 Last data filed at 11/26/2023 0735 Gross per 24 hour  Intake 240 ml  Output 550 ml  Net -310 ml      11/26/2023    5:54 AM 11/25/2023    6:29 AM 11/24/2023    4:45 AM  Last 3 Weights  Weight (lbs) 138 lb 6.4 oz 136 lb 14.4 oz 137 lb 5.6 oz  Weight (kg) 62.778 kg 62.097 kg 62.3 kg      Telemetry/ECG   Persistent afib with ventricular rates averaging <100bpm over the last 24 hrs - Personally Reviewed  Physical Exam  GEN: No acute distress.   Neck: No JVD Cardiac: irregularly irregular, no murmurs, rubs, or gallops.  Respiratory: Bilateral bases diminished L>R with faint crackles GI: Soft, nontender, non-distended  MS: No edema  Assessment & Plan   Acute on chronic HFrEF Patient diagnosed with HFpEF in 2021, started Torsemide  20mg  every other day. In 2023, Spironolactone  added, later stopped due to hyperkalemia. Patient presented to [facility] ED on 6/7 with worsening dyspnea. Appeared hypervolemic on exam. Pro BNP 3177. Patient admitted for management. TTE during this admission showed LVEF 30-35% (previously normal in 2023). Patient had notable clinical improvement on IV lasix  60mg  BID. Overall I/O for the admission -1.023L. No longer on lasix  and generally appears  euvolemic today. Given HFrEF, patient was started on Entresto this admission. SGLT2 deferred. MRA also not started with hx hyperkalemia. Given age, frailty, and potentially high patient cost responsibility, Losartan  or alternative ARB may be better long term option for her over [place]. Will work with pharmacy to confirm cost.  Metoprolol  Tartrate converted to Toprol  XL 50mg  once daily.  Persistent atrial fibrillation with RVR Patient with history of afib since 2018. She was initially managed in [facility] . She had multiple cardioversions with quick reversion back to atrial fibrillation. Has failed/declined multiple AADs. Her main symptom was fatigue, though she found no relief during normal sinus rhythm. In 2019 shared decision to stop pursuing rhythm control and switch to rate control with diltiazem  and nebivolol . Nebivolol  discontinued in 2024 due to bradycardia. Patient presented to [facility] on 6/7 with dyspnea and hypervolemia, found to be in afib with RVR. Upon discovery of reduced LVEF, diltiazem  discontinued. Initially rate control attempted with Lopressor  only. However, with persistently elevated rates, Dr. Alroy Aspen started Digoxin 0.125mg  daily (with TID 0.25mg  load). Rates remain well controlled overall. Will need to reassess digoxin level tomorrow given high loading dose and patient's age. Cautiously continue 0.125mg  daily. Convert Lopressor  to Toprol  XL 50mg  daily Continue Eliquis  5mg  BID.  Hypertension BP stable, low normal following Entresto initiation. Meds as above.  AAA Continue with plans for conservative  management.     For questions or updates, please contact [facility] Please consult [email or web address] for contact info under       Signed, Leala Prince, PA-C    Attending Note:   The patient was seen and examined.  Agree with assessment and plan as noted above.  Changes made to the above note as needed.  Patient seen and independently examined with  Leala Prince, PA .   We discussed all aspects of the encounter. I agree with the assessment and plan as stated above.    ATrial fib :  permanent  Her persistant Afib .   HR is well controled.  Cont current meds Cont eliquis , metoprolol  XL, digoxin   2.  HFrEF :  BP is well controlled.  Contine entresto   She is doing well I suspect she could go home tomorrow      I have spent a total of 40 minutes with patient reviewing hospital  notes , telemetry, EKGs, labs and examining patient as well as establishing an assessment and plan that was discussed with the patient.  > 50% of time was spent in direct patient care.    [place], Marieta Shorten., MD, [facility] 11/26/2023, 2:41 PM [address],  [address] Office [phone] Pager [phone]

## 2023-11-27 DIAGNOSIS — I4891 Unspecified atrial fibrillation: Secondary | ICD-10-CM | POA: Diagnosis not present

## 2023-11-27 DIAGNOSIS — I4821 Permanent atrial fibrillation: Secondary | ICD-10-CM | POA: Diagnosis not present

## 2023-11-27 DIAGNOSIS — E876 Hypokalemia: Secondary | ICD-10-CM | POA: Diagnosis not present

## 2023-11-27 DIAGNOSIS — I10 Essential (primary) hypertension: Secondary | ICD-10-CM | POA: Diagnosis not present

## 2023-11-27 DIAGNOSIS — I5023 Acute on chronic systolic (congestive) heart failure: Secondary | ICD-10-CM | POA: Diagnosis not present

## 2023-11-27 LAB — BASIC METABOLIC PANEL WITH GFR
Anion gap: 9 (ref 5–15)
BUN: 20 mg/dL (ref 8–23)
CO2: 22 mmol/L (ref 22–32)
Calcium: 8.7 mg/dL — ABNORMAL LOW (ref 8.9–10.3)
Chloride: 98 mmol/L (ref 98–111)
Creatinine, Ser: 0.62 mg/dL (ref 0.44–1.00)
GFR, Estimated: >60 mL/min (ref >60)
Glucose, Bld: 86 mg/dL (ref 70–99)
Potassium: 4.2 mmol/L (ref 3.5–5.1)
Sodium: 129 mmol/L — ABNORMAL LOW (ref 135–145)

## 2023-11-27 LAB — DIGOXIN LEVEL: Digoxin Level: 1 ng/mL (ref 0.8–2.0)

## 2023-11-27 MED ORDER — DIGOXIN 125 MCG PO TABS
0.0625 mg | ORAL_TABLET | Freq: Every day | ORAL | Status: DC
  Filled 2023-11-27: qty 1

## 2023-11-27 NOTE — Progress Notes (Signed)
 Physical Therapy Treatment Patient Details Name: Whitney Burton MRN: [id number] DOB: [birth year] Today's Date: 11/27/2023   History of Present Illness 89 year old ILF resident  presented to [facility] 11/21/23 with complaints of worsening shortness of breath. BNP>3000; afib RVR PMH-a-fib, anxiety, breast cancer, HTN, CHF, PAF on Eliquis     PT Comments  Pt up in chair on arrival and agreeable to session with continued progress towards acute goals. Pt demonstrating increased activity tolerance this session, progressing gait distance with rollator support and supervision for safety with decreased SOB noted and pt demonstrating good self pacing, taking x3 standing recovery breaks as needed. Educated pt on energy conservation techniques, taking seated rest breaks as needed during ambulation to/from dining hall with pt verbalizing understanding. Pt continues to benefit from skilled PT services to progress toward functional mobility goals.     If plan is discharge home, recommend the following:     Can travel by private vehicle        Equipment Recommendations  None recommended by PT    Recommendations for Other Services OT consult     Precautions / Restrictions Precautions Precautions: Fall Precaution/Restrictions Comments: reports fell Aug 2024 Restrictions Weight Bearing Restrictions Per Provider Order: No     Mobility  Bed Mobility Overal bed mobility: Modified Independent             General bed mobility comments: OOB in chair    Transfers Overall transfer level: Needs assistance Equipment used: Rollator (4 wheels) Transfers: Sit to/from Stand Sit to Stand: Supervision           General transfer comment: improved brake use this session, supervision for safety    Ambulation/Gait Ambulation/Gait assistance: Supervision Gait Distance (Feet): 300 Feet Assistive device: Rollator (4 wheels) Gait Pattern/deviations: Step-through pattern, WFL(Within Functional  Limits) Gait velocity: decr     General Gait Details: steady gait with x2 standing rest breaks due to fatigue.   Stairs             Wheelchair Mobility     Tilt Bed    Modified Rankin (Stroke Patients Only)       Balance Overall balance assessment: Mild deficits observed, not formally tested, History of Falls                                          Communication Communication Communication: No apparent difficulties  Cognition Arousal: Alert Behavior During Therapy: WFL for tasks assessed/performed   PT - Cognitive impairments: No apparent impairments                         Following commands: Intact      Cueing Cueing Techniques: Verbal cues  Exercises      General Comments General comments (skin integrity, edema, etc.): HR up to 152bpm, variable throughotu activity      Pertinent Vitals/Pain Pain Assessment Pain Assessment: Faces Faces Pain Scale: Hurts a little bit Pain Location: mid back Pain Descriptors / Indicators: Discomfort, Sore Pain Intervention(s): Monitored during session, Limited activity within patient's tolerance, Heat applied    Home Living                          Prior Function            PT Goals (current goals can now be found in the care  plan section) Acute Rehab PT Goals Patient Stated Goal: keep strength up as she lives alone PT Goal Formulation: With patient Time For Goal Achievement: 12/07/23 Progress towards PT goals: Progressing toward goals    Frequency    Min 2X/week      PT Plan      Co-evaluation              AM-PAC PT 6 Clicks Mobility   Outcome Measure  Help needed turning from your back to your side while in a flat bed without using bedrails?: None Help needed moving from lying on your back to sitting on the side of a flat bed without using bedrails?: A Little Help needed moving to and from a bed to a chair (including a wheelchair)?: A Little Help  needed standing up from a chair using your arms (e.g., wheelchair or bedside chair)?: A Little Help needed to walk in hospital room?: A Little Help needed climbing 3-5 steps with a railing? : A Little 6 Click Score: 19    End of Session Equipment Utilized During Treatment: Gait belt Activity Tolerance: Patient tolerated treatment well Patient left: in chair;with call bell/phone within reach;Other (comment) (with heat pack at mid back) Nurse Communication: Mobility status PT Visit Diagnosis: Difficulty in walking, not elsewhere classified (R26.2)     Time: 1610-9604 PT Time Calculation (min) (ACUTE ONLY): 19 min  Charges:    $Gait Training: 8-22 mins PT General Charges $$ ACUTE PT VISIT: 1 Visit                     Irie Dowson R. PTA Acute Rehabilitation Services Office: [phone]   Agapito Horseman 11/27/2023, 12:20 PM

## 2023-11-27 NOTE — Progress Notes (Signed)
 Mobility Specialist Progress Note;   11/27/23 1335  Mobility  Activity Ambulated with assistance in hallway;Transferred from bed to chair  Level of Assistance Standby assist, set-up cues, supervision of patient - no hands on  Assistive Device Four wheel walker  Distance Ambulated (ft) 300 ft  Activity Response Tolerated well  Mobility Referral Yes  Mobility visit 1 Mobility  Mobility Specialist Start Time (ACUTE ONLY) 1335  Mobility Specialist Stop Time (ACUTE ONLY) 1350  Mobility Specialist Time Calculation (min) (ACUTE ONLY) 15 min   Pt agreeable to mobility. Required no physical assistance during ambulation, SV. HR between 98-141 bpm w/ activity, asx. Required no seated rest breaks. Pt requested to sit in chair at [facility]. Pt left with all needs met, call bell in reach.   Janit Meline Mobility Specialist Please contact via SecureChat or [employer] [phone]

## 2023-11-27 NOTE — Plan of Care (Signed)
  Problem: Education: Goal: Knowledge of General Education information will improve Description: Including pain rating scale, medication(s)/side effects and non-pharmacologic comfort measures Outcome: Progressing   Problem: Health Behavior/Discharge Planning: Goal: Ability to manage health-related needs will improve Outcome: Progressing   Problem: Clinical Measurements: Goal: Will remain free from infection Outcome: Progressing Goal: Diagnostic test results will improve Outcome: Progressing Goal: Respiratory complications will improve Outcome: Progressing   Problem: Nutrition: Goal: Adequate nutrition will be maintained Outcome: Progressing   Problem: Elimination: Goal: Will not experience complications related to bowel motility Outcome: Progressing Goal: Will not experience complications related to urinary retention Outcome: Progressing   Problem: Pain Managment: Goal: General experience of comfort will improve and/or be controlled Outcome: Progressing

## 2023-11-27 NOTE — Progress Notes (Signed)
 Progress Note   Patient: Whitney Burton WUJ:[id number] DOB: 1932-12-11 DOA: 11/21/2023     6 DOS: the patient was seen and examined on 11/27/2023   Brief hospital course: Mrs. Salmon was admitted to the hospital with the working diagnosis of heart failure exacerbation.   89 year old ILF resident with a history of congestive heart failure, HTN, and permanent atrial fibrillation who presented to [facility] 11/21/23 with complaints of worsening shortness of breath despite using her prescribed torsemide  as directed.  Positive dyspnea on exertion, orthopnea and lower extremity edema.  On her initial physical examination her blood pressure was 139/79, HR 58, RR 32 and 02 saturation 95%  Lungs with no wheezing or rhonchi, heart with S1 and S2 present irregularly irregular with no gallops, rubs or murmurs, abdomen with no distention and positive lower extremity edema ++.   Na 136, K 3,9 Cl 98 bicarbonate 24 glucose 100 bun 17 cr 0,66 AST 20 ALT 13  BNP 3,177  High sensitive troponin < 15 and < 15  Wbc 8,0 hgb 15.6 plt 296   Chest radiograph with hypoinflation, positive cardiomegaly with fluid in the right fissure, with no infiltrates or effusions.   EKG 129 bpm, normal axis, normal intervals, qtc 445, atrial fibrillation rhythm with poor RR wave progression, no significant ST segment or T wave changes, low voltage.   Assessment and Plan: * Acute on chronic systolic CHF (congestive heart failure) (HCC) Echocardiogram with reduced LV systolic function EF 30 to 35%, global hypokinesis, moderate dilatation of internal cavity, RV systolic function with mild reduction with mild enlarged cavity, severe LA and RA dilatation, small pericardial effusion, posterior to the LV, moderate tricuspid valve regurgitation,   Documented urine output is 100 cc Systolic blood pressure 90 to 956 mmHg range.   Continue metoprolol  succinate and digoxin  If blood pressure continue to be low will use ARB and hold on  entresto .  Holding on diuretic therapy for now.   Permanent atrial fibrillation (HCC) Continue rate control with metoprolol  and digoxin  Telemetry with atrial fibrillation rate 90 to 100 bpm, occasional PVC.    Essential hypertension Blood pressure has been low, continue close monitoring.  Will hold on entresto  for now and will continue metoprolol    Hypokalemia Hyponatremia   Renal function today with serum cr at 0,62 with K at 4,2 and serum bicarbonate at 22  Na 129   Continue close follow up renal function and electrolytes Holding diuretic therapy for now.   Depression Continue with citalopram .       Subjective: Patient feeling very weak and deconditioned, no palpitations of chest pain, positive lower back pain.   Physical Exam: Vitals:   11/27/23 0330 11/27/23 0500 11/27/23 0748 11/27/23 0824  BP: 102/63  (!) 86/57 97/75  Pulse: 81  79 76  Resp: (!) 25  (!) 24 20  Temp: 97.8 F (36.6 C)  97.6 F (36.4 C)   TempSrc: Oral  Oral   SpO2: 94%  97% 97%  Weight:  62.7 kg    Height:       Neurology awake and alert ENT with mild pallor with no icterus Cardiovascular with S1 and S2 present, irregularly irregular with no gallops, rubs or murmurs No JVD Trace lower extremity edema Respiratory with no rales or wheezing, no rhonchi, positive kyphosis  Abdomen with no distention   Data Reviewed:    Family Communication: no family at the bedside   Disposition: Status is: Inpatient Remains inpatient appropriate because: recovering from heart failure  Planned Discharge Destination: Home   Author: Albertus Alt, MD 11/27/2023 9:12 AM  For on call review [email or web address].

## 2023-11-27 NOTE — Progress Notes (Signed)
 Progress Note  Patient Name: Whitney Burton Date of Encounter: 11/27/2023 [facility] Cardiologist: Oneil Bigness, MD   Interval Summary   Patient resting comfortably in bedside chair. Denies focal dyspnea today though states hard to really know without ambulation. Is not feeling any palpitations or other symptoms with her afib.   Vital Signs Vitals:   11/27/23 0330 11/27/23 0500 11/27/23 0748 11/27/23 0824  BP: 102/63  (!) 86/57 97/75  Pulse: 81  79 76  Resp: (!) 25  (!) 24 20  Temp: 97.8 F (36.6 C)  97.6 F (36.4 C)   TempSrc: Oral  Oral   SpO2: 94%  97% 97%  Weight:  62.7 kg    Height:        Intake/Output Summary (Last 24 hours) at 11/27/2023 0913 Last data filed at 11/27/2023 0331 Gross per 24 hour  Intake 240 ml  Output 100 ml  Net 140 ml      11/27/2023    5:00 AM 11/26/2023    5:54 AM 11/25/2023    6:29 AM  Last 3 Weights  Weight (lbs) 138 lb 3.7 oz 138 lb 6.4 oz 136 lb 14.4 oz  Weight (kg) 62.7 kg 62.778 kg 62.097 kg      Telemetry/ECG   Persistent atrial fib .  HR 90s   Physical Exam  GEN: elderly female,  NAD  Neck: No JVD Cardiac: irreg. Irreg.  Respiratory: faint rales,   GI: Soft, nontender, non-distended  MS: No edema  Assessment & Plan   Acute on chronic HFrEF Patient diagnosed with HFpEF in 2021, started Torsemide  20mg  every other day. In 2023, Spironolactone  added, later stopped due to hyperkalemia. Patient presented to [facility] ED on 6/7 with worsening dyspnea. Appeared hypervolemic on exam. Pro BNP 3177. Patient admitted for management. TTE during this admission showed LVEF 30-35% (previously normal in 2023). Patient had notable clinical improvement on IV lasix  60mg  BID. Overall I/O for the admission -1.023L. No longer on lasix  and generally appears euvolemic today. Given HFrEF, patient was started on Entresto  this admission. SGLT2 deferred. MRA also not started with hx hyperkalemia. Given age, frailty, and potentially high patient cost  responsibility, Losartan  or alternative ARB may be better long term option for her over Entresto . Will work with pharmacy to confirm cost.  Metoprolol  Tartrate converted to Toprol  XL 50mg  once daily.  Persistent atrial fibrillation with RVR Patient with history of afib since 2018. She was initially managed in [facility] . She had multiple cardioversions with quick reversion back to atrial fibrillation. Has failed/declined multiple AADs. Her main symptom was fatigue, though she found no relief during normal sinus rhythm. In 2019 shared decision to stop pursuing rhythm control and switch to rate control with diltiazem  and nebivolol . Nebivolol  discontinued in 2024 due to bradycardia. Patient presented to [facility] on 6/7 with dyspnea and hypervolemia, found to be in afib with RVR. Upon discovery of reduced LVEF, diltiazem  discontinued. Initially rate control attempted with Lopressor  only. However, with persistently elevated rates, Dr. Alroy Aspen started Digoxin  0.125mg  daily (with TID 0.25mg  load).  Continue Toprol  XL 50 mg a day ,  Digoxin  0.125 every day was added because we were unable to achieve adequate rate control on metoprolol  alone and because of her moderate reduction of LV function   Continue Eliquis  5mg  BID.  2.  Hypertension Low/ normal. If her BP drops too low, I would DC Entresto  and start low dose Valsartan    3.  HFrEF :  echo showed a  newly reduced LV systolic function.  She has a hx of HFpEF in the past. I suspect her reduced LVEF was due to Afib with RVR    AAA Continue with plans for conservative management.     Ahmad Alert, MD  11/27/2023 9:31 AM    [facility] [address] [place],  [address] [place], [place]  [address] Phone: [phone]; Fax: [phone]

## 2023-11-28 ENCOUNTER — Other Ambulatory Visit (HOSPITAL_COMMUNITY): Payer: Self-pay

## 2023-11-28 DIAGNOSIS — I5023 Acute on chronic systolic (congestive) heart failure: Secondary | ICD-10-CM | POA: Diagnosis not present

## 2023-11-28 DIAGNOSIS — I4821 Permanent atrial fibrillation: Secondary | ICD-10-CM | POA: Diagnosis not present

## 2023-11-28 DIAGNOSIS — I5021 Acute systolic (congestive) heart failure: Secondary | ICD-10-CM

## 2023-11-28 DIAGNOSIS — I10 Essential (primary) hypertension: Secondary | ICD-10-CM | POA: Diagnosis not present

## 2023-11-28 DIAGNOSIS — E876 Hypokalemia: Secondary | ICD-10-CM | POA: Diagnosis not present

## 2023-11-28 LAB — RENAL FUNCTION PANEL
Albumin: 3.1 g/dL — ABNORMAL LOW (ref 3.5–5.0)
Anion gap: 6 (ref 5–15)
BUN: 19 mg/dL (ref 8–23)
CO2: 25 mmol/L (ref 22–32)
Calcium: 8.8 mg/dL — ABNORMAL LOW (ref 8.9–10.3)
Chloride: 98 mmol/L (ref 98–111)
Creatinine, Ser: 0.72 mg/dL (ref 0.44–1.00)
GFR, Estimated: >60 mL/min (ref >60)
Glucose, Bld: 94 mg/dL (ref 70–99)
Phosphorus: 3.1 mg/dL (ref 2.5–4.6)
Potassium: 4.3 mmol/L (ref 3.5–5.1)
Sodium: 129 mmol/L — ABNORMAL LOW (ref 135–145)

## 2023-11-28 MED ORDER — TORSEMIDE 20 MG PO TABS
20.0000 mg | ORAL_TABLET | Freq: Every day | ORAL | 0 refills | Status: DC | PRN
  Filled 2023-11-28: qty 30, 30d supply, fill #0

## 2023-11-28 MED ORDER — LOSARTAN POTASSIUM 25 MG PO TABS
12.5000 mg | ORAL_TABLET | Freq: Every day | ORAL | 0 refills | Status: DC
  Filled 2023-11-28: qty 30, 60d supply, fill #0

## 2023-11-28 MED ORDER — LOSARTAN POTASSIUM 25 MG PO TABS
12.5000 mg | ORAL_TABLET | Freq: Every day | ORAL | Status: DC
  Filled 2023-11-28: qty 1

## 2023-11-28 MED ORDER — METOPROLOL SUCCINATE ER 50 MG PO TB24
50.0000 mg | ORAL_TABLET | Freq: Every day | ORAL | 0 refills | Status: DC
  Filled 2023-11-28: qty 30, 30d supply, fill #0

## 2023-11-28 MED ORDER — DIGOXIN 62.5 MCG PO TABS
0.0625 mg | ORAL_TABLET | Freq: Every day | ORAL | 0 refills | Status: DC
  Filled 2023-11-28: qty 30, 30d supply, fill #0

## 2023-11-28 NOTE — Progress Notes (Signed)
 Mobility Specialist Progress Note;   11/28/23 0850  Mobility  Activity Ambulated with assistance in hallway;Ambulated with assistance to bathroom  Level of Assistance Standby assist, set-up cues, supervision of patient - no hands on  Assistive Device Four wheel walker  Distance Ambulated (ft) 120 ft  Activity Response Tolerated fair  Mobility Referral Yes  Mobility visit 1 Mobility  Mobility Specialist Start Time (ACUTE ONLY) 0850  Mobility Specialist Stop Time (ACUTE ONLY) 0907  Mobility Specialist Time Calculation (min) (ACUTE ONLY) 17 min   Pt in chair upon arrival, agreeable to mobility. Required no physical assistance during ambulation, SV.  HR 106-152 bpm during ambulation, no c/o. Requested to use BR at [facility], BM successful. Pt returned back to chair with all needs met, call bell in reach. HR recovered w/ rest.  Whitney Burton Mobility Specialist Please contact via SecureChat or Rehab Office [phone]

## 2023-11-28 NOTE — TOC Transition Note (Signed)
 Transition of Care [facility]) - Discharge Note   Patient Details  Name: Whitney Burton MRN: [id number] Date of Birth: Feb 25, 1933  Transition of Care [facility] CM/SW Contact:  Jannine Meo, RN Phone Number: 11/28/2023, 12:56 PM   Clinical Narrative:   Patient is being discharged today. HH PT/OT/RN arranged through Angie with [facility]. Contact information on AVS.    Final next level of care: Home w Home Health Services Barriers to Discharge: No Barriers Identified   Patient Goals and CMS Choice            Discharge Placement                       Discharge Plan and Services Additional resources added to the After Visit Summary for                            HH Arranged: PT, OT, RN [facility] Agency: [facility] Date [facility] Agency Contacted: 11/28/23 Time HH Agency Contacted: 1049 Representative spoke with at [facility] Agency: Angie (Accepted @ 1245)  Social Drivers of Health (SDOH) Interventions SDOH Screenings   Food Insecurity: No Food Insecurity (11/21/2023)  Housing: Low Risk  (11/21/2023)  Transportation Needs: No Transportation Needs (11/21/2023)  Utilities: Not At Risk (11/21/2023)  Depression (PHQ2-9): Medium Risk (10/22/2022)  Social Connections: Moderately Isolated (11/21/2023)  Tobacco Use: Medium Risk (11/21/2023)     Readmission Risk Interventions     No data to display

## 2023-11-28 NOTE — Progress Notes (Addendum)
 Progress Note  Patient Name: Whitney Burton Date of Encounter: 11/28/2023 [facility] Cardiologist: Oneil Bigness, MD   Interval Summary   BP 101/71.  Creatinine 0.72.  She reports some shortness of breath, denies any chest pain  Vital Signs Vitals:   11/27/23 2350 11/28/23 0331 11/28/23 0611 11/28/23 0803  BP: 112/71 106/60  101/71  Pulse: 84 69 83   Resp: 20 20 (!) 21 19  Temp:  (!) 97.5 F (36.4 C)  97.6 F (36.4 C)  TempSrc:  Oral  Oral  SpO2: 94% 96% 95%   Weight:   62 kg   Height:        Intake/Output Summary (Last 24 hours) at 11/28/2023 0900 Last data filed at 11/28/2023 0806 Gross per 24 hour  Intake 240 ml  Output 300 ml  Net -60 ml      11/28/2023    6:11 AM 11/27/2023    5:00 AM 11/26/2023    5:54 AM  Last 3 Weights  Weight (lbs) 136 lb 11 oz 138 lb 3.7 oz 138 lb 6.4 oz  Weight (kg) 62 kg 62.7 kg 62.778 kg      Telemetry/ECG   Persistent atrial fib .  HR 80s   Physical Exam  GEN: elderly female,  NAD  Neck: No JVD Cardiac: irreg.: Respiratory: CTAB GI: Soft, nontender, non-distended  MS: No edema  Assessment & Plan   Acute HFrEF Patient diagnosed with HFpEF in 2021, started Torsemide  20mg  every other day. In 2023, Spironolactone  added, later stopped due to hyperkalemia. Patient presented to [facility] ED on 6/7 with worsening dyspnea. Appeared hypervolemic on exam. Pro BNP 3177. Patient admitted for management. TTE during this admission showed LVEF 30-35% (previously normal in 2023). Patient had notable clinical improvement on IV lasix  60mg  BID. Overall I/O for the admission -1.023L. No longer on lasix  and generally appears euvolemic today. Given HFrEF, patient was started on Entresto  this admission. SGLT2 deferred. MRA also not started with hx hyperkalemia. Given age, frailty, and soft BP, suspect Entresto  is not going to be a good option for her.  Will switch to low-dose losartan  Metoprolol  Tartrate converted to Toprol  XL 50mg  once  daily. Continue torsemide  20 mg daily as needed if gains more than 3 pounds in 1 day or 5 pounds in one week  Permanent atrial fibrillation Patient with history of afib since 2018. She was initially managed in [facility] . She had multiple cardioversions with quick reversion back to atrial fibrillation. Has failed/declined multiple AADs. Her main symptom was fatigue, though she found no relief during normal sinus rhythm. In 2019 shared decision to stop pursuing rhythm control and switch to rate control with diltiazem  and nebivolol . Nebivolol  discontinued in 2024 due to bradycardia. Patient presented to [facility] on 6/7 with dyspnea and hypervolemia, found to be in afib with RVR. Upon discovery of reduced LVEF, diltiazem  discontinued. Initially rate control attempted with Lopressor  only. However, with persistently elevated rates, Dr. Alroy Aspen started Digoxin  0.125mg  daily (with TID 0.25mg  load).  Continue Toprol  XL 50 mg a day ,  Digoxin  0.125 every day was added because we were unable to achieve adequate rate control on metoprolol  alone and because of her moderate reduction of LV function.  Digoxin  level 1.0 on 6/13, dose reduced to 0.0625 mg daily.  Rates appear well controlled.  Continue Eliquis  5mg  BID.   AAA Continue with plans for conservative management.   [facility] will sign off.   Medication Recommendations: Losartan  12.5 mg  daily, Toprol -XL 50 mg daily, digoxin  0.0625 mg daily, Eliquis  5 mg twice daily.  Recommend monitoring daily weights and take torsemide  20 mg if gains more than 3 pounds in 1 day or 5 pounds 1 week Other recommendations (labs, testing, etc): BMET in 1 week Follow up as an outpatient: F/u appointment scheduled 7/1     Wendie Hamburg, MD  11/28/2023 9:00 AM

## 2023-11-28 NOTE — Discharge Summary (Signed)
 Physician Discharge Summary   Patient: Whitney Burton MRN: [id number] DOB: [birth year]  Admit date:     11/21/2023  Discharge date: 11/28/23  Discharge Physician: Albertus Alt   PCP: Sun, Vyvyan, MD   Recommendations at discharge:    Patient has been placed on heart failure guideline directed medical therapy with metoprolol  succinate and losartan . Follow up as outpatient for possible addition of SGLT 2 inh and mineralocorticoid receptor blocker. During her  hospitalization medical therapy limited due to hypotension.  Placed on digoxin  for heart failure and atrial fibrillation.  Discontinued diltiazem  due to low LV ejection fraction.  Continue torsemide  as needed for volume overload, weight gain 2 to 3 lbs in 24 hrs or 5 lbs in 7 days.  Follow up renal function and electrolytes in 7 days as outpatient.  Follow up with Dr Paulene Boron in 7 to 10 days Follow up with Cardiology as scheduled   Discharge Diagnoses: Principal Problem:   Acute on chronic systolic CHF (congestive heart failure) (HCC) Active Problems:   Permanent atrial fibrillation (HCC)   Essential hypertension   Hypokalemia   Depression  Resolved Problems:   * No resolved hospital problems. [facility] Course: Whitney Burton was admitted to the hospital with the working diagnosis of heart failure exacerbation.   89 year old independent living facility resident with a history of congestive heart failure, HTN, and permanent atrial fibrillation who presented to [facility] 11/21/23 with complaints of worsening shortness of breath despite using her prescribed torsemide  as directed.  Positive dyspnea on exertion, orthopnea and lower extremity edema.  On her initial physical examination her blood pressure was 139/79, HR 58, RR 32 and 02 saturation 95%  Lungs with no wheezing or rhonchi, heart with S1 and S2 present irregularly irregular with no gallops, rubs or murmurs, abdomen with no distention and positive lower extremity  edema ++.   Na 136, K 3,9 Cl 98 bicarbonate 24 glucose 100 bun 17 cr 0,66 AST 20 ALT 13  BNP 3,177  High sensitive troponin < 15 and < 15  Wbc 8,0 hgb 15.6 plt 296   Chest radiograph with hypoinflation, positive cardiomegaly with fluid in the right fissure, with no infiltrates or effusions.   EKG 129 bpm, normal axis, normal intervals, qtc 445, atrial fibrillation rhythm with poor RR wave progression, no significant ST segment or T wave changes, low voltage.   Patient was placed on furosemide  IV for diuresis. Had intermittent RVR atrial fibrillation, AV blockade was optimize with metoprolol  succinate and digoxin .   06/14 patient with improved volume status, plan for discharge home and follow up as outpatient,.   Assessment and Plan: * Acute on chronic systolic CHF (congestive heart failure) (HCC) Echocardiogram with reduced LV systolic function EF 30 to 35%, global hypokinesis, moderate dilatation of LV internal cavity, RV systolic function with mild reduction with mild enlarged cavity, severe LA and RA dilatation, small pericardial effusion, posterior to the LV, moderate tricuspid valve regurgitation,   Patient was placed on IV furosemide  for diuresis, negative fluid balance was achieved with improvement in her symptoms.   Continue metoprolol  succinate and digoxin  Patient did not tolerate entersto due to hypotension, plan to start low dose losartan  Continue torsemide  as needed for volume overload.   Permanent atrial fibrillation (HCC) Continue rate control with metoprolol  and digoxin  Telemetry with atrial fibrillation rhythm 100 bpm, range.  Anticoagulation with apixaban .  06/13 digoxin  level 1,0   Essential hypertension Continue metoprolol  and low dose losartan .  Follow up as  outpatient.   Hypokalemia Hyponatremia   Improved volume status, at the time of her discharge her renal function had a serum cr of 0,72 with K at 4,3 and serum bicarbonate at 25  Na 129   Continue  diuresis with torsemide  as needed.   Depression Continue with citalopram .        Consultants: Cardiology  Procedures performed: none   Disposition: Home Diet recommendation:  Cardiac diet DISCHARGE MEDICATION: Allergies as of 11/28/2023       Reactions   Ciprofloxacin Hcl    Other reaction(s): Unknown   Levofloxacin    Other reaction(s): Unknown   Ondansetron  Hcl    Other reaction(s): constipation        Medication List     STOP taking these medications    Tiadylt  ER 240 MG 24 hr capsule Generic drug: diltiazem        TAKE these medications    acetaminophen  500 MG tablet Commonly known as: TYLENOL  Take 500 mg by mouth every 6 (six) hours as needed for mild pain.   apixaban  5 MG Tabs tablet Commonly known as: Eliquis  Take 1 tablet (5 mg total) by mouth 2 (two) times daily.   citalopram  20 MG tablet Commonly known as: CELEXA  Take 20 mg by mouth at bedtime.   Digoxin  62.5 MCG Tabs Take 0.0625 mg by mouth daily. Start taking on: November 29, 2023   losartan  25 MG tablet Commonly known as: COZAAR  Take 0.5 tablets (12.5 mg total) by mouth daily. Start taking on: November 29, 2023   metoprolol  succinate 50 MG 24 hr tablet Commonly known as: TOPROL -XL Take 1 tablet (50 mg total) by mouth at bedtime. Take with or immediately following a meal.   torsemide  20 MG tablet Commonly known as: DEMADEX  Take 1 tablet (20 mg total) by mouth daily as needed. Take as needed for weight gain on 2 to  3 lbs in 24 hrs  or 5 lbs in 7 days What changed: additional instructions   trolamine salicylate 10 % cream Commonly known as: ASPERCREME Apply 1 Application topically as needed for muscle pain (Back).   Vitamin D3 25 MCG (1000 UT) Caps Take 1 capsule by mouth at bedtime.        Discharge Exam: Filed Weights   11/26/23 0554 11/27/23 0500 11/28/23 0611  Weight: 62.8 kg 62.7 kg 62 kg   BP 101/71 (BP Location: Right Arm)   Pulse 83   Temp 97.6 F (36.4 C) (Oral)    Resp 19   Ht 4' 11 (1.499 m)   Wt 62 kg   SpO2 95%   BMI 27.61 kg/m   Patient is feeling better, back pain and neck pain have improved, no chest pain or palpitations   Neurology awake and alert ENT with mild pallor Cardiovascular with S1 and S2 present, irregularly irregular with no gallops, rubs or murmurs No JVD No lower extremity edema at the ankles Respiratory with no wheezing or rhonchi, no rales  Abdomen with no distention  No rashes   Condition at discharge: stable  The results of significant diagnostics from this hospitalization (including imaging, microbiology, ancillary and laboratory) are listed below for reference.   Imaging Studies: ECHOCARDIOGRAM COMPLETE Result Date: 11/22/2023    ECHOCARDIOGRAM REPORT   Patient Name:   JOSELYNN AMOROSO Date of Exam: 11/22/2023 Medical Rec #:  [id number]  Height:       59.0 in Accession #:    [id number] Weight:       135.9 lb Date  of Birth:  [birth year]   BSA:          1.565 m Patient Age:    89 years   BP:           132/70 mmHg Patient Gender: F          HR:           99 bpm. Exam Location:  Inpatient Procedure: 2D Echo, Color Doppler, Cardiac Doppler and Intracardiac            Opacification Agent (Both Spectral and Color Flow Doppler were            utilized during procedure). Indications:    CHF Acute Diastolic  History:        Patient has no prior history of Echocardiogram examinations,                 most recent 08/08/2021. CHF, Arrythmias:Atrial Fibrillation; Risk                 Factors:Former Smoker and Hypertension. HFpEF.  Sonographer:    Gelene Kelly RDCS Referring Phys: [id number] JEFFREY T MCCLUNG IMPRESSIONS  1. Left ventricular ejection fraction, by estimation, is 30 to 35%. The left ventricle has moderately decreased function. The left ventricle demonstrates global hypokinesis. The left ventricular internal cavity size was moderately dilated. Left ventricular diastolic parameters are indeterminate.  2. Right ventricular systolic function is  mildly reduced. The right ventricular size is mildly enlarged.  3. Left atrial size was severely dilated.  4. Right atrial size was severely dilated.  5. A small pericardial effusion is present. The pericardial effusion is posterior to the left ventricle.  6. The mitral valve is abnormal. Mild mitral valve regurgitation. No evidence of mitral stenosis.  7. Tricuspid valve regurgitation is moderate.  8. The aortic valve is tricuspid. There is mild calcification of the aortic valve. There is mild thickening of the aortic valve. Aortic valve regurgitation is trivial. Aortic valve sclerosis is present, with no evidence of aortic valve stenosis.  9. The inferior vena cava is normal in size with greater than 50% respiratory variability, suggesting right atrial pressure of 3 mmHg. FINDINGS  Left Ventricle: Left ventricular ejection fraction, by estimation, is 30 to 35%. The left ventricle has moderately decreased function. The left ventricle demonstrates global hypokinesis. Definity  contrast agent was given IV to delineate the left ventricular endocardial borders. Strain was performed and the global longitudinal strain is indeterminate. The left ventricular internal cavity size was moderately dilated. There is no left ventricular hypertrophy. Left ventricular diastolic parameters are indeterminate. Right Ventricle: The right ventricular size is mildly enlarged. No increase in right ventricular wall thickness. Right ventricular systolic function is mildly reduced. Left Atrium: Left atrial size was severely dilated. Right Atrium: Right atrial size was severely dilated. Pericardium: A small pericardial effusion is present. The pericardial effusion is posterior to the left ventricle. Mitral Valve: The mitral valve is abnormal. There is mild thickening of the mitral valve leaflet(s). Mild mitral valve regurgitation. No evidence of mitral valve stenosis. Tricuspid Valve: The tricuspid valve is normal in structure. Tricuspid  valve regurgitation is moderate . No evidence of tricuspid stenosis. Aortic Valve: The aortic valve is tricuspid. There is mild calcification of the aortic valve. There is mild thickening of the aortic valve. Aortic valve regurgitation is trivial. Aortic valve sclerosis is present, with no evidence of aortic valve stenosis. Pulmonic Valve: The pulmonic valve was normal in structure. Pulmonic valve regurgitation is mild. No evidence  of pulmonic stenosis. Aorta: The aortic root is normal in size and structure. Venous: The inferior vena cava is normal in size with greater than 50% respiratory variability, suggesting right atrial pressure of 3 mmHg. IAS/Shunts: No atrial level shunt detected by color flow Doppler. Additional Comments: 3D was performed not requiring image post processing on an independent workstation and was indeterminate.  LEFT VENTRICLE PLAX 2D LVIDd:         4.70 cm   Diastology LVIDs:         3.80 cm   LV e' medial:    6.96 cm/s LV PW:         1.10 cm   LV E/e' medial:  12.3 LV IVS:        1.00 cm   LV e' lateral:   4.57 cm/s LVOT diam:     2.00 cm   LV E/e' lateral: 18.8 LV SV:         32 LV SV Index:   20 LVOT Area:     3.14 cm  RIGHT VENTRICLE RV S prime:     15.30 cm/s TAPSE (M-mode): 1.0 cm LEFT ATRIUM             Index        RIGHT ATRIUM           Index LA diam:        4.50 cm 2.88 cm/m   RA Area:     22.10 cm LA Vol (A2C):   59.6 ml 38.08 ml/m  RA Volume:   62.50 ml  39.94 ml/m LA Vol (A4C):   75.0 ml 47.92 ml/m LA Biplane Vol: 68.9 ml 44.02 ml/m  AORTIC VALVE LVOT Vmax:   57.70 cm/s LVOT Vmean:  39.800 cm/s LVOT VTI:    0.102 m  AORTA Ao Root diam: 3.20 cm Ao Asc diam:  3.50 cm MITRAL VALVE MV Area (PHT): 5.75 cm    SHUNTS MV Decel Time: 132 msec    Systemic VTI:  0.10 m MV E velocity: 85.70 cm/s  Systemic Diam: 2.00 cm MV A velocity: 25.20 cm/s MV E/A ratio:  3.40 Janelle Mediate MD Electronically signed by Janelle Mediate MD Signature Date/Time: 11/22/2023/12:08:35 PM    Final    DG  Chest Portable 1 View Result Date: 11/21/2023 CLINICAL DATA:  pain EXAM: PORTABLE CHEST 1 VIEW COMPARISON:  March 20, 2023, December 25, 2022 FINDINGS: The cardiomediastinal silhouette is unchanged and enlarged in contour.Atherosclerotic calcifications of the tortuous thoracic aorta. No pleural effusion. No pneumothorax. No acute pleuroparenchymal abnormality. Remote RIGHT-sided rib fractures. IMPRESSION: No acute cardiopulmonary abnormality. Electronically Signed   By: Clancy Crimes M.D.   On: 11/21/2023 13:40    Microbiology: No results found for this or any previous visit.  Labs: CBC: Recent Labs  Lab 11/21/23 1324 11/23/23 0209  WBC 8.0 8.5  NEUTROABS 5.1  --   HGB 15.6* 16.0*  HCT 45.1 46.3*  MCV 95.3 94.3  PLT 296 298   Basic Metabolic Panel: Recent Labs  Lab 11/22/23 0206 11/23/23 0209 11/24/23 0227 11/26/23 0853 11/27/23 0228 11/28/23 0209  NA 136 132* 134* 131* 129* 129*  K 3.4* 4.1 4.8 4.0 4.2 4.3  CL 97* 97* 99 97* 98 98  CO2 28 26 27 24 22 25   GLUCOSE 98 95 104* 96 86 94  BUN 13 19 22 22 20 19   CREATININE 0.71 0.90 0.72 0.65 0.62 0.72  CALCIUM 9.3 9.5 9.7 9.4 8.7* 8.8*  MG 1.9 1.9  --   --   --   --  PHOS  --   --   --   --   --  3.1   Liver Function Tests: Recent Labs  Lab 11/21/23 1412 11/28/23 0209  AST 20  --   ALT 13  --   ALKPHOS 87  --   BILITOT 0.6  --   PROT 7.3  --   ALBUMIN 4.4 3.1*   CBG: No results for input(s): GLUCAP in the last 168 hours.  Discharge time spent: greater than 30 minutes.  Signed: Albertus Alt, MD [facility] 11/28/2023

## 2023-11-30 DIAGNOSIS — I4821 Permanent atrial fibrillation: Secondary | ICD-10-CM | POA: Diagnosis not present

## 2023-11-30 DIAGNOSIS — I5023 Acute on chronic systolic (congestive) heart failure: Secondary | ICD-10-CM | POA: Diagnosis not present

## 2023-11-30 DIAGNOSIS — Z556 Problems related to health literacy: Secondary | ICD-10-CM | POA: Diagnosis not present

## 2023-11-30 DIAGNOSIS — E876 Hypokalemia: Secondary | ICD-10-CM | POA: Diagnosis not present

## 2023-11-30 DIAGNOSIS — I110 Hypertensive heart disease with heart failure: Secondary | ICD-10-CM | POA: Diagnosis not present

## 2023-12-02 NOTE — Progress Notes (Signed)
 Cardiology Office Note   Date:  12/15/2023  ID:  Whitney Burton, DOB 1933/03/17, MRN [id number] PCP: Sun, Vyvyan, MD  [facility] Providers Cardiologist:  Darryle ONEIDA Decent, MD    History of Present Illness Whitney Burton is a 89 y.o. female with a past medical history of permanent atrial fibrillation on Eliquis , HFrEF, HTN. Patient followed by Dr. Decent and presents today for a hospital follow up appointment   Patient was first diagnosed with atrial fibrillation in 2018. She had multiple cardioversions with quick reversion to atrial fibrillation. Has failed/declined multiple AADs. Decided to manage with a rate control strategy in 2019. In 2021, she was diagnosed with chronic diastolic heart failure. Echocardiogram in 07/2021 showed EF 55-60%, no regional wall motion abnormalities, grade III DD, normal RV systolic function, normal PA systolic pressure, severe biatrial enlargement, mild MR, moderate TR. In the past, she was taken off spironolactone  due to hyperkalemia and was taken off nebivolol  due to bradycardia.   Patient was admitted 6/7 - 11/28/23. Initially presented complaining of  shortness of breath, orthopnea. proBNP elevated to 3177. hsTn negative. EKG showed atrial fibrillation with HR 129 BPM. Underwent echocardiogram 11/22/23 that showed EF 30-35%, mildly reduced RV systolic function, severe biatrial enlargement, mild MR, moderate TR. Felt that she had a tachy mediated cardiomyopathy. Treated with IV diuretics. She required digoxin  for HR control. She was discharged on Losartan  12.5 mg daily, toprol -XL 50 mg daily, digoxin  0.0625 mg daily, eliquis  5 mg BID, torsemide  20 mg PRN.   On interview, patient reports that she has been having issues falling asleep since being discharged from the hospital.  She has also continued to have some shortness of breath and a dry cough.  Notes feeling short of breath on exertion and occasionally having orthopnea when she tries to lay in bed at night.  She denies  any chest pain.  She has a long history of atrial fibrillation but since being discharged from the hospital has felt like her heart rate is higher than normal.  She occasionally feels a fluttering/pounding feeling in her chest which is her good deal of anxiety.  She brings a blood pressure and heart rate log today, heart rate is predominantly in the 80s-90s.  Reports is higher than usual.  Her blood pressure has also been a bit elevated, systolic BP in the 120s-130s.  She has been compliant with all of her medications.  Currently takes torsemide  as needed.  Since getting out of the hospital about 10 days ago, she is only taken about 3 doses of torsemide .  She takes when her weight goes up or when she has lower extremity swelling.  On the days that she does take torsemide , she notices improvement in her swelling and her weight returns to normal.  Studies Reviewed Cardiac Studies & Procedures   ______________________________________________________________________________________________     ECHOCARDIOGRAM  ECHOCARDIOGRAM COMPLETE 11/22/2023  Narrative ECHOCARDIOGRAM REPORT    Patient Name:   Whitney Burton Date of Exam: 11/22/2023 Medical Rec #:  [id number]  Height:       59.0 in Accession #:    [id number] Weight:       135.9 lb Date of Birth:  [birth year]   BSA:          1.565 m Patient Age:    89 years   BP:           132/70 mmHg Patient Gender: F          HR:  99 bpm. Exam Location:  Inpatient  Procedure: 2D Echo, Color Doppler, Cardiac Doppler and Intracardiac Opacification Agent (Both Spectral and Color Flow Doppler were utilized during procedure).  Indications:    CHF Acute Diastolic  History:        Patient has no prior history of Echocardiogram examinations, most recent 08/08/2021. CHF, Arrythmias:Atrial Fibrillation; Risk Factors:Former Smoker and Hypertension. HFpEF.  Sonographer:    Orvil Holmes RDCS Referring Phys: [id number] JEFFREY T MCCLUNG  IMPRESSIONS   1. Left  ventricular ejection fraction, by estimation, is 30 to 35%. The left ventricle has moderately decreased function. The left ventricle demonstrates global hypokinesis. The left ventricular internal cavity size was moderately dilated. Left ventricular diastolic parameters are indeterminate. 2. Right ventricular systolic function is mildly reduced. The right ventricular size is mildly enlarged. 3. Left atrial size was severely dilated. 4. Right atrial size was severely dilated. 5. A small pericardial effusion is present. The pericardial effusion is posterior to the left ventricle. 6. The mitral valve is abnormal. Mild mitral valve regurgitation. No evidence of mitral stenosis. 7. Tricuspid valve regurgitation is moderate. 8. The aortic valve is tricuspid. There is mild calcification of the aortic valve. There is mild thickening of the aortic valve. Aortic valve regurgitation is trivial. Aortic valve sclerosis is present, with no evidence of aortic valve stenosis. 9. The inferior vena cava is normal in size with greater than 50% respiratory variability, suggesting right atrial pressure of 3 mmHg.  FINDINGS Left Ventricle: Left ventricular ejection fraction, by estimation, is 30 to 35%. The left ventricle has moderately decreased function. The left ventricle demonstrates global hypokinesis. Definity  contrast agent was given IV to delineate the left ventricular endocardial borders. Strain was performed and the global longitudinal strain is indeterminate. The left ventricular internal cavity size was moderately dilated. There is no left ventricular hypertrophy. Left ventricular diastolic parameters are indeterminate.  Right Ventricle: The right ventricular size is mildly enlarged. No increase in right ventricular wall thickness. Right ventricular systolic function is mildly reduced.  Left Atrium: Left atrial size was severely dilated.  Right Atrium: Right atrial size was severely dilated.  Pericardium:  A small pericardial effusion is present. The pericardial effusion is posterior to the left ventricle.  Mitral Valve: The mitral valve is abnormal. There is mild thickening of the mitral valve leaflet(s). Mild mitral valve regurgitation. No evidence of mitral valve stenosis.  Tricuspid Valve: The tricuspid valve is normal in structure. Tricuspid valve regurgitation is moderate . No evidence of tricuspid stenosis.  Aortic Valve: The aortic valve is tricuspid. There is mild calcification of the aortic valve. There is mild thickening of the aortic valve. Aortic valve regurgitation is trivial. Aortic valve sclerosis is present, with no evidence of aortic valve stenosis.  Pulmonic Valve: The pulmonic valve was normal in structure. Pulmonic valve regurgitation is mild. No evidence of pulmonic stenosis.  Aorta: The aortic root is normal in size and structure.  Venous: The inferior vena cava is normal in size with greater than 50% respiratory variability, suggesting right atrial pressure of 3 mmHg.  IAS/Shunts: No atrial level shunt detected by color flow Doppler.  Additional Comments: 3D was performed not requiring image post processing on an independent workstation and was indeterminate.   LEFT VENTRICLE PLAX 2D LVIDd:         4.70 cm   Diastology LVIDs:         3.80 cm   LV e' medial:    6.96 cm/s LV PW:  1.10 cm   LV E/e' medial:  12.3 LV IVS:        1.00 cm   LV e' lateral:   4.57 cm/s LVOT diam:     2.00 cm   LV E/e' lateral: 18.8 LV SV:         32 LV SV Index:   20 LVOT Area:     3.14 cm   RIGHT VENTRICLE RV S prime:     15.30 cm/s TAPSE (M-mode): 1.0 cm  LEFT ATRIUM             Index        RIGHT ATRIUM           Index LA diam:        4.50 cm 2.88 cm/m   RA Area:     22.10 cm LA Vol (A2C):   59.6 ml 38.08 ml/m  RA Volume:   62.50 ml  39.94 ml/m LA Vol (A4C):   75.0 ml 47.92 ml/m LA Biplane Vol: 68.9 ml 44.02 ml/m AORTIC VALVE LVOT Vmax:   57.70 cm/s LVOT Vmean:   39.800 cm/s LVOT VTI:    0.102 m  AORTA Ao Root diam: 3.20 cm Ao Asc diam:  3.50 cm  MITRAL VALVE MV Area (PHT): 5.75 cm    SHUNTS MV Decel Time: 132 msec    Systemic VTI:  0.10 m MV E velocity: 85.70 cm/s  Systemic Diam: 2.00 cm MV A velocity: 25.20 cm/s MV E/A ratio:  3.40  Maude Emmer MD Electronically signed by Maude Emmer MD Signature Date/Time: 11/22/2023/12:08:35 PM    Final          ______________________________________________________________________________________________      Risk Assessment/Calculations  CHA2DS2-VASc Score = 6   This indicates a 9.7% annual risk of stroke. The patient's score is based upon: CHF History: 1 HTN History: 1 Diabetes History: 0 Stroke History: 0 Vascular Disease History: 1 Age Score: 2 Gender Score: 1      Physical Exam VS:  BP (!) 142/84   Pulse 92   Ht 4' 11 (1.499 m)   Wt 145 lb 6.4 oz (66 kg)   SpO2 95%   BMI 29.37 kg/m    Wt Readings from Last 3 Encounters:  12/15/23 145 lb 6.4 oz (66 kg)  11/28/23 136 lb 11 oz (62 kg)  09/24/23 142 lb (64.4 kg)    GEN: Well nourished, well developed in no acute distress. Sitting comfortably in the chair  NECK: No JVD CARDIAC: Irregular rate and rhythm, no murmurs, rubs, gallops RESPIRATORY:  Crackles in bilateral lung bases. Normal WOB on room air   ABDOMEN: Soft, non-tender, non-distended EXTREMITIES:  1+ edema in BLE; No deformity   ASSESSMENT AND PLAN  Chronic Systolic Heart Failure  - Patient admitted in 11/2023 with shortness of breath, orthopnea. Found to be in afib with RVR and hypervolemic  - Echocardiogram on 6/8 showed EF 30-35%, mildly reduced RV systolic function, severe biatrial enlargement, mild MR, moderate TR - Felt to be tachy-mediated cardiomyopathy - She is on torsemide  PRN and has taken about 3 doses in the past 2 weeks. She weighs daily and knows to take torsemide  for weight gain >3lbs overnight  - She reports ongoing shortness of breath and  orthopnea since DC. Also has occasional ankle edema. On exam, has crackles in lung bases and 1+ lower extremity edema  - Instructed patient to take torsemide  20 mg for the next 3 days. Then, OK to go back to  taking as needed  - Ordered BMP today. Also ordered to be repeated next week  - In the past, patient was taken off spiro due to hyperkalemia  - Continue losartan  12.5 mg daily, metoprolol  succinate  - Given patient's age, frailty, lack of chest pain, no plans for ischemic evaluation at this time   Permanent Atrial Fibrillation  - First diagnosed with afib in 2018. Failed multiple cardioversions and AADs. Has been managed with a rate control strategy since 2019 - During recent admission, patient was in afib with RVR in the setting of hypervolemia. Her home dilt was stopped due to reduced EF on echo. Discharged on metoprolol  succinate 50 mg daily, digoxin  0.0625 mg daily  - Per review of HR log, HR has been in the 90s. Patient reports that this is higher than usual and she has significant palpitations  - Increase metoprolol  succinate to 75 mg daily. At home, BP has been in the 120s-140s systolic, so should tolerate this fine  - Continue digoxin  62.5 mcg daily  - Dig level 1.0 on 6/13, after multiple days of digoxin , dose reduced after this  - Ordered dig level - this will be collected next week at the time of repeat BMP. Instructed patient to not take her digoxin  on that day until after labs drawn  - Continue eliquis  5 mg BID- denies bleeding. 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  - Patient is not a surgical candidate due to age and preference  - Continue conservative management of AAA   HTN  - Continue losartan  12.5 mg daily - Increasing metoprolol  succinate to 75 mg daily  - BP log shows that her SBP is in the 120s-140s at home   -  Ordered BMP as above   Dispo: Follow up in 3-4 weeks with APP   Signed, Rollo FABIENE Louder, PA-C

## 2023-12-07 DIAGNOSIS — I10 Essential (primary) hypertension: Secondary | ICD-10-CM | POA: Diagnosis not present

## 2023-12-07 DIAGNOSIS — M542 Cervicalgia: Secondary | ICD-10-CM | POA: Diagnosis not present

## 2023-12-07 DIAGNOSIS — I4821 Permanent atrial fibrillation: Secondary | ICD-10-CM | POA: Diagnosis not present

## 2023-12-07 DIAGNOSIS — I5023 Acute on chronic systolic (congestive) heart failure: Secondary | ICD-10-CM | POA: Diagnosis not present

## 2023-12-08 DIAGNOSIS — L821 Other seborrheic keratosis: Secondary | ICD-10-CM | POA: Diagnosis not present

## 2023-12-08 DIAGNOSIS — I4821 Permanent atrial fibrillation: Secondary | ICD-10-CM | POA: Diagnosis not present

## 2023-12-08 DIAGNOSIS — L820 Inflamed seborrheic keratosis: Secondary | ICD-10-CM | POA: Diagnosis not present

## 2023-12-08 DIAGNOSIS — D1801 Hemangioma of skin and subcutaneous tissue: Secondary | ICD-10-CM | POA: Diagnosis not present

## 2023-12-08 DIAGNOSIS — I110 Hypertensive heart disease with heart failure: Secondary | ICD-10-CM | POA: Diagnosis not present

## 2023-12-08 DIAGNOSIS — E876 Hypokalemia: Secondary | ICD-10-CM | POA: Diagnosis not present

## 2023-12-08 DIAGNOSIS — I5023 Acute on chronic systolic (congestive) heart failure: Secondary | ICD-10-CM | POA: Diagnosis not present

## 2023-12-08 DIAGNOSIS — Z556 Problems related to health literacy: Secondary | ICD-10-CM | POA: Diagnosis not present

## 2023-12-08 DIAGNOSIS — L814 Other melanin hyperpigmentation: Secondary | ICD-10-CM | POA: Diagnosis not present

## 2023-12-09 DIAGNOSIS — I4821 Permanent atrial fibrillation: Secondary | ICD-10-CM | POA: Diagnosis not present

## 2023-12-09 DIAGNOSIS — Z556 Problems related to health literacy: Secondary | ICD-10-CM | POA: Diagnosis not present

## 2023-12-09 DIAGNOSIS — E876 Hypokalemia: Secondary | ICD-10-CM | POA: Diagnosis not present

## 2023-12-09 DIAGNOSIS — I5023 Acute on chronic systolic (congestive) heart failure: Secondary | ICD-10-CM | POA: Diagnosis not present

## 2023-12-09 DIAGNOSIS — I110 Hypertensive heart disease with heart failure: Secondary | ICD-10-CM | POA: Diagnosis not present

## 2023-12-15 ENCOUNTER — Encounter: Payer: Self-pay | Admitting: Cardiology

## 2023-12-15 ENCOUNTER — Ambulatory Visit: Attending: Cardiology | Admitting: Cardiology

## 2023-12-15 VITALS — BP 142/84 | HR 92 | Ht 59.0 in | Wt 145.4 lb

## 2023-12-15 DIAGNOSIS — I10 Essential (primary) hypertension: Secondary | ICD-10-CM | POA: Diagnosis not present

## 2023-12-15 DIAGNOSIS — I5022 Chronic systolic (congestive) heart failure: Secondary | ICD-10-CM

## 2023-12-15 DIAGNOSIS — I7143 Infrarenal abdominal aortic aneurysm, without rupture: Secondary | ICD-10-CM | POA: Diagnosis not present

## 2023-12-15 DIAGNOSIS — I4821 Permanent atrial fibrillation: Secondary | ICD-10-CM | POA: Diagnosis not present

## 2023-12-15 DIAGNOSIS — I5032 Chronic diastolic (congestive) heart failure: Secondary | ICD-10-CM | POA: Diagnosis not present

## 2023-12-15 MED ORDER — METOPROLOL SUCCINATE ER 25 MG PO TB24: 25.0000 mg | ORAL_TABLET | Freq: Every day | ORAL | 3 refills | Status: DC

## 2023-12-15 MED ORDER — METOPROLOL SUCCINATE ER 50 MG PO TB24: 50.0000 mg | ORAL_TABLET | Freq: Every day | ORAL | 3 refills | Status: DC

## 2023-12-15 NOTE — Patient Instructions (Signed)
 Medication Instructions:  Increase metoprolol  succinate to 75 mg (50 mg tablet and 25 mg tablet) once a day  Take Torsemide  for the next 3 days then go be to as needed *If you need a refill on your cardiac medications before your next appointment, please call your pharmacy*  Lab Work: Today we are going to draw a bmet In a week we are going to need a dig level(Hold Digoxin  the morning of) and Bmet If you have labs (blood work) drawn today and your tests are completely normal, you will receive your results only by: MyChart Message (if you have MyChart) OR A paper copy in the mail If you have any lab test that is abnormal or we need to change your treatment, we will call you to review the results.  Testing/Procedures: No testing  Follow-Up: At [facility], you and your health needs are our priority.  As part of our continuing mission to provide you with exceptional heart care, our providers are all part of one team.  This team includes your primary Cardiologist (physician) and Advanced Practice Providers or APPs (Physician Assistants and Nurse Practitioners) who all work together to provide you with the care you need, when you need it.  Your next appointment:   3 week(s)  Provider:   Rollo Louder, PA-C    We recommend signing up for the patient portal called MyChart.  Sign up information is provided on this After Visit Summary.  MyChart is used to connect with patients for Virtual Visits (Telemedicine).  Patients are able to view lab/test results, encounter notes, upcoming appointments, etc.  Non-urgent messages can be sent to your provider as well.   To learn more about what you can do with MyChart, go to [email or web address].

## 2023-12-16 ENCOUNTER — Ambulatory Visit: Payer: Self-pay | Admitting: Cardiology

## 2023-12-16 DIAGNOSIS — I5023 Acute on chronic systolic (congestive) heart failure: Secondary | ICD-10-CM | POA: Diagnosis not present

## 2023-12-16 DIAGNOSIS — I110 Hypertensive heart disease with heart failure: Secondary | ICD-10-CM | POA: Diagnosis not present

## 2023-12-16 DIAGNOSIS — I4821 Permanent atrial fibrillation: Secondary | ICD-10-CM | POA: Diagnosis not present

## 2023-12-16 DIAGNOSIS — E876 Hypokalemia: Secondary | ICD-10-CM | POA: Diagnosis not present

## 2023-12-16 DIAGNOSIS — Z556 Problems related to health literacy: Secondary | ICD-10-CM | POA: Diagnosis not present

## 2023-12-16 LAB — BASIC METABOLIC PANEL WITH GFR
BUN/Creatinine Ratio: 20 (ref 12–28)
BUN: 12 mg/dL (ref 8–27)
CO2: 22 mmol/L (ref 20–29)
Calcium: 10.1 mg/dL (ref 8.7–10.3)
Chloride: 96 mmol/L (ref 96–106)
Creatinine, Ser: 0.6 mg/dL (ref 0.57–1.00)
Glucose: 96 mg/dL (ref 70–99)
Potassium: 5.2 mmol/L (ref 3.5–5.2)
Sodium: 135 mmol/L (ref 134–144)
eGFR: 86 mL/min/{1.73_m2} (ref >59)

## 2023-12-17 DIAGNOSIS — E876 Hypokalemia: Secondary | ICD-10-CM | POA: Diagnosis not present

## 2023-12-17 DIAGNOSIS — I5023 Acute on chronic systolic (congestive) heart failure: Secondary | ICD-10-CM | POA: Diagnosis not present

## 2023-12-17 DIAGNOSIS — Z556 Problems related to health literacy: Secondary | ICD-10-CM | POA: Diagnosis not present

## 2023-12-17 DIAGNOSIS — I4821 Permanent atrial fibrillation: Secondary | ICD-10-CM | POA: Diagnosis not present

## 2023-12-17 DIAGNOSIS — I110 Hypertensive heart disease with heart failure: Secondary | ICD-10-CM | POA: Diagnosis not present

## 2023-12-17 NOTE — Telephone Encounter (Signed)
-----   Message from Rollo FABIENE Louder sent at 12/16/2023 10:16 AM EDT ----- Please tell patient that her lab work showed normal kidney function and electrolytes. Her potassium is 5.2, which is the upper limit of normal. She should decrease her intake of potassium rich foods  like bananas, avocados, leafy greens, sweet potatoes. We have already ordered repeat BMP for next week after she takes her torsemide  for a few days straight. NO changes to treatment plan    ----- Message ----- From: Interface, [facility] Lab Results In Sent: 12/16/2023   3:35 AM EDT To: Rollo JONELLE Louder, PA-C

## 2023-12-17 NOTE — Telephone Encounter (Signed)
 Called patient advised of below they verbalized understanding.

## 2023-12-22 DIAGNOSIS — I5023 Acute on chronic systolic (congestive) heart failure: Secondary | ICD-10-CM | POA: Diagnosis not present

## 2023-12-22 DIAGNOSIS — I4821 Permanent atrial fibrillation: Secondary | ICD-10-CM | POA: Diagnosis not present

## 2023-12-22 DIAGNOSIS — Z556 Problems related to health literacy: Secondary | ICD-10-CM | POA: Diagnosis not present

## 2023-12-22 DIAGNOSIS — E876 Hypokalemia: Secondary | ICD-10-CM | POA: Diagnosis not present

## 2023-12-22 DIAGNOSIS — I110 Hypertensive heart disease with heart failure: Secondary | ICD-10-CM | POA: Diagnosis not present

## 2023-12-23 DIAGNOSIS — I4821 Permanent atrial fibrillation: Secondary | ICD-10-CM | POA: Diagnosis not present

## 2023-12-23 DIAGNOSIS — I5032 Chronic diastolic (congestive) heart failure: Secondary | ICD-10-CM | POA: Diagnosis not present

## 2023-12-24 DIAGNOSIS — I5023 Acute on chronic systolic (congestive) heart failure: Secondary | ICD-10-CM | POA: Diagnosis not present

## 2023-12-24 DIAGNOSIS — E876 Hypokalemia: Secondary | ICD-10-CM | POA: Diagnosis not present

## 2023-12-24 DIAGNOSIS — Z556 Problems related to health literacy: Secondary | ICD-10-CM | POA: Diagnosis not present

## 2023-12-24 DIAGNOSIS — I4821 Permanent atrial fibrillation: Secondary | ICD-10-CM | POA: Diagnosis not present

## 2023-12-24 DIAGNOSIS — I110 Hypertensive heart disease with heart failure: Secondary | ICD-10-CM | POA: Diagnosis not present

## 2023-12-24 LAB — BASIC METABOLIC PANEL WITH GFR
BUN/Creatinine Ratio: 21 (ref 12–28)
BUN: 14 mg/dL (ref 8–27)
CO2: 21 mmol/L (ref 20–29)
Calcium: 10.2 mg/dL (ref 8.7–10.3)
Chloride: 98 mmol/L (ref 96–106)
Creatinine, Ser: 0.67 mg/dL (ref 0.57–1.00)
Glucose: 97 mg/dL (ref 70–99)
Potassium: 4.6 mmol/L (ref 3.5–5.2)
Sodium: 137 mmol/L (ref 134–144)
eGFR: 84 mL/min/1.73 (ref >59)

## 2023-12-24 LAB — DIGOXIN LEVEL: Digoxin, Serum: <0.4 ng/mL — ABNORMAL LOW (ref 0.5–0.9)

## 2023-12-25 ENCOUNTER — Telehealth: Payer: Self-pay | Admitting: Cardiovascular Disease

## 2023-12-25 MED ORDER — DIGOXIN 62.5 MCG PO TABS: 0.0625 mg | ORAL_TABLET | Freq: Every day | ORAL | 3 refills | Status: DC

## 2023-12-25 NOTE — Telephone Encounter (Signed)
 Patient calling in to get her labs results, to see if she is suppose to continue with the medication on not. Please advise

## 2023-12-25 NOTE — Telephone Encounter (Signed)
 Spoke with the patient and advised that no changes to her medications were needed at this time. She is requesting a refill on digoxin  - this has been sent in.

## 2023-12-28 ENCOUNTER — Other Ambulatory Visit: Payer: Self-pay

## 2023-12-28 NOTE — Progress Notes (Signed)
 Discussed in a phone note.

## 2023-12-28 NOTE — Telephone Encounter (Signed)
 Patient would like a call back to review lab results + medication instructions again.

## 2023-12-28 NOTE — Telephone Encounter (Signed)
 S/W pt, relayed Rollo Johnson's feedback to pt. Pt agrees to stop digoxin , D/C'd from chart.

## 2023-12-28 NOTE — Telephone Encounter (Signed)
 Hey, do you mind reviewing my result note on this patient's digoxin  lab?

## 2023-12-29 ENCOUNTER — Telehealth: Payer: Self-pay | Admitting: Cardiovascular Disease

## 2023-12-29 DIAGNOSIS — Z556 Problems related to health literacy: Secondary | ICD-10-CM | POA: Diagnosis not present

## 2023-12-29 DIAGNOSIS — I110 Hypertensive heart disease with heart failure: Secondary | ICD-10-CM | POA: Diagnosis not present

## 2023-12-29 DIAGNOSIS — E876 Hypokalemia: Secondary | ICD-10-CM | POA: Diagnosis not present

## 2023-12-29 DIAGNOSIS — I5023 Acute on chronic systolic (congestive) heart failure: Secondary | ICD-10-CM | POA: Diagnosis not present

## 2023-12-29 DIAGNOSIS — I4821 Permanent atrial fibrillation: Secondary | ICD-10-CM | POA: Diagnosis not present

## 2023-12-29 NOTE — Telephone Encounter (Signed)
 Spoke to patient Whitney Louder PA's advice given.She will take Torsemide  20 mg daily.Advised keep appointment with Whitney Louder PA 7/25 at 10:55 am.

## 2023-12-29 NOTE — Telephone Encounter (Signed)
 Pt c/o Shortness Of Breath: STAT if SOB developed within the last 24 hours or pt is noticeably SOB on the phone  1. Are you currently SOB (can you hear that pt is SOB on the phone)? Yes   2. How long have you been experiencing SOB? A couple week at least   3. Are you SOB when sitting or when up moving around? All the time but worse when she lays down   4. Are you currently experiencing any other symptoms? She stated her BP , the bottom number has still been high 129/96 hr -93 yesterday

## 2023-12-29 NOTE — Telephone Encounter (Signed)
 Pt calling in b/c she is still SOB.  Says that she is SOB just lying around, but now it is keeping  me awake.  Has been occurring for about a month.   She occasionally gains 2 lbs overnight and uses her PRN Torsemide .. she used it last on 7/10 and before that it was 7/2.  She is not sure if afib is occurring, says only flips around but she cannot tell me how frequently. She further says she did not take her Digoxin  this morning, she mentioned something about was told to stop it when finished w/ current medication bottle.  She would like to get rid of the SOB and get some sleep.  Forwarding to Rollo Louder, who saw pt at beginning of month, for advisement (scheduled to see Rollo next Friday, 7/25)

## 2023-12-31 DIAGNOSIS — I4821 Permanent atrial fibrillation: Secondary | ICD-10-CM | POA: Diagnosis not present

## 2023-12-31 DIAGNOSIS — E876 Hypokalemia: Secondary | ICD-10-CM | POA: Diagnosis not present

## 2023-12-31 DIAGNOSIS — I110 Hypertensive heart disease with heart failure: Secondary | ICD-10-CM | POA: Diagnosis not present

## 2023-12-31 DIAGNOSIS — I5023 Acute on chronic systolic (congestive) heart failure: Secondary | ICD-10-CM | POA: Diagnosis not present

## 2023-12-31 DIAGNOSIS — Z556 Problems related to health literacy: Secondary | ICD-10-CM | POA: Diagnosis not present

## 2024-01-05 DIAGNOSIS — L821 Other seborrheic keratosis: Secondary | ICD-10-CM | POA: Diagnosis not present

## 2024-01-05 DIAGNOSIS — L853 Xerosis cutis: Secondary | ICD-10-CM | POA: Diagnosis not present

## 2024-01-05 DIAGNOSIS — I4821 Permanent atrial fibrillation: Secondary | ICD-10-CM | POA: Diagnosis not present

## 2024-01-05 DIAGNOSIS — L814 Other melanin hyperpigmentation: Secondary | ICD-10-CM | POA: Diagnosis not present

## 2024-01-05 DIAGNOSIS — Z556 Problems related to health literacy: Secondary | ICD-10-CM | POA: Diagnosis not present

## 2024-01-05 DIAGNOSIS — I110 Hypertensive heart disease with heart failure: Secondary | ICD-10-CM | POA: Diagnosis not present

## 2024-01-05 DIAGNOSIS — L820 Inflamed seborrheic keratosis: Secondary | ICD-10-CM | POA: Diagnosis not present

## 2024-01-05 DIAGNOSIS — I5023 Acute on chronic systolic (congestive) heart failure: Secondary | ICD-10-CM | POA: Diagnosis not present

## 2024-01-05 DIAGNOSIS — D1801 Hemangioma of skin and subcutaneous tissue: Secondary | ICD-10-CM | POA: Diagnosis not present

## 2024-01-05 DIAGNOSIS — E876 Hypokalemia: Secondary | ICD-10-CM | POA: Diagnosis not present

## 2024-01-06 NOTE — Progress Notes (Unsigned)
 Cardiology Office Note    Date:  01/08/2024  ID:  Whitney Burton, DOB 1933-02-17, MRN [id number] PCP:  Sun, Vyvyan, MD  Cardiologist:  Darryle ONEIDA Decent, MD  Electrophysiologist:  None   Chief Complaint: Follow up for atrial fibrillation and HFrEF   History of Present Illness: .    Whitney Burton is a 89 y.o. female with visit-pertinent history of HFrEF, permanent atrial fibrillation on Eliquis , AAA with possible penetrating ulcer, hypertension, breast cancer and anxiety.  She has been followed by Dr. Decent.  Previously lived in [facility] [place]  and was followed by cardiology practice there.  She moved to the area in 02/2019 and has been followed by Dr. Decent.  She was initially diagnosed with permanent atrial fibrillation in 2019 while living in [facility].  She underwent a cardioversion but this was unsuccessful and declined antiarrhythmics or repeat cardioversion following.  Echocardiogram in 07/2021 showed LVEF of 55 to 60% with normal wall motion and grade 3 diastolic dysfunction, mildly enlarged RV with normal systolic function and normal PASP, severe biatrial enlargement, mild MR and moderate TR.  At office visit in 11/2022 she reported dyspnea on exertion and low energy.  She also continued to lose weight, her nebivolol  was stopped as her heart rate was in the 40s to 50s and her torsemide  was decreased.  It was felt that her dyspnea was likely to be related to dehydration and low blood pressure with her compression fracture of thoracic vertebrae contributing as well.  In 12/2022 she was admitted following a fall.  She was found to have multiple fractures including a displaced posterior ninth and 10th right rib fractures and acute nondisplaced fractures of the right T8 and T9 transverse processes as well as a T12 compression fracture that had progressed from 08/2022.  She was admitted by general surgery and conservative therapy was recommended.  Hospitalization was complicated by hyponatremia  with sodium as low as 118.  It was felt this was likely secondary to volume overload as she was initially treated with IV fluids.  Her IV fluids were stopped and she was started on sodium tablets with free water restriction.  Her home torsemide  was briefly increased and she was advised to go back to usual twice weekly dosing.  Her spironolactone  was discontinued in setting of hyperkalemia.  On 03/09/2023 she notified the office of feeling more short of breath.  It was noted the patient had chronic dyspnea on exertion but felt this has been getting worse over the last 3 weeks.  She denies shortness of breath at rest, she sleeps at an incline this is more comfortable with her back problems.  She had some mild lower extremity edema but felt to be stable.  She denied any weight gain and stated she had actually been losing weight.  Patient reported that while her spironolactone  had been discontinued during his hospitalization she had continued taking at home.  Patient's chest x-ray was overall clear and her BNP was mildly elevated although improved compared to prior lab results at 271.  It was felt that her shortness of breath was due to deconditioning.  Patient was last seen in clinic on/10/25 by Dr. Decent.  Patient had remained stable from a cardiac standpoint.  In 11/2023 patient was admitted from 6/7 - 11/28/2023.  Patient initially presented complaining of shortness of breath, and orthopnea.  proBNP was elevated to 3177, high since troponin was negative.  EKG showed atrial fibrillation with heart rate 129 bpm.  Echo  on 6-8/25 showed EF 30 to 35%, mildly reduced RV systolic function, severe biatrial enlargement, mild MR, moderate TR.  It was felt that she had tachycardia mediated cardiomyopathy.  Treated with IV diuretics.  She required oxygen for heart rate control.  She was discharged on losartan  12.5 mg daily, Toprol  XL 50 mg daily, digoxin  0.0625 mg daily and Eliquis  5 mg twice daily, torsemide  20 mg as  needed.  Patient was seen in clinic on 12/15/2023 by Rollo Louder, PA patient reported she been having issues falling asleep since being discharged in the hospital and also continued to have some shortness of breath and dry cough.  Patient reported feeling short of breath on exertion and occasionally having orthopnea when she tried to lay in bed at night.  She denied any chest pain.  Patient reported feeling as though her heart rate was slightly higher than normal, would occasionally feel a fluttering/pounding feeling in her chest which brings her anxiety.  It was noted that on her heart rate and blood pressure log her heart rate was predominantly in the 80s to 90s.  Systolic blood pressures in the 120s to 130s.  Today she presents for follow-up.  Patient endorses shortness of breath with ongoing exertion as well as difficulty breathing when she lays down flat in bed.  She reports that she is having to prop up on 1-1/2 pillows at night to try to sleep however notes that she has been having difficulty falling asleep at night.  Reports that she typically will end up having to go moved to different spots that tonight to try to sleep, notes that she simply feels as though she is constantly wide-awake.  She does endorse occasional palpitations however on review of her log her heart rate has overall been well-controlled, heart rate has consistently been less than 90 bpm with systolic blood pressures in the 120s to 130s.  Patient reports that she did not note a significant change while on digoxin , is no longer taking the medication.  Patient has noted some increased improvement being on torsemide  20 mg daily however continues to endorse increased lower extremity edema, shortness of breath and orthopnea.  ROS: .   Today she denies chest pain, fatigue, palpitations, melena, hematuria, hemoptysis, diaphoresis, weakness, presyncope, syncope, orthopnea, and PND.  All other systems are reviewed and otherwise  negative. Studies Reviewed: SABRA   EKG:  EKG is not ordered today.  CV Studies: Cardiac studies reviewed are outlined and summarized above. Otherwise please see EMR for full report. Cardiac Studies & Procedures   ______________________________________________________________________________________________     ECHOCARDIOGRAM  ECHOCARDIOGRAM COMPLETE 11/22/2023  Narrative ECHOCARDIOGRAM REPORT    Patient Name:   Whitney Burton Date of Exam: 11/22/2023 Medical Rec #:  [id number]  Height:       59.0 in Accession #:    [id number] Weight:       135.9 lb Date of Birth:  1932-06-17   BSA:          1.565 m Patient Age:    89 years   BP:           132/70 mmHg Patient Gender: F          HR:           99 bpm. Exam Location:  Inpatient  Procedure: 2D Echo, Color Doppler, Cardiac Doppler and Intracardiac Opacification Agent (Both Spectral and Color Flow Doppler were utilized during procedure).  Indications:    CHF Acute Diastolic  History:  Patient has no prior history of Echocardiogram examinations, most recent 08/08/2021. CHF, Arrythmias:Atrial Fibrillation; Risk Factors:Former Smoker and Hypertension. HFpEF.  Sonographer:    Orvil Holmes RDCS Referring Phys: [id number] JEFFREY T MCCLUNG  IMPRESSIONS   1. Left ventricular ejection fraction, by estimation, is 30 to 35%. The left ventricle has moderately decreased function. The left ventricle demonstrates global hypokinesis. The left ventricular internal cavity size was moderately dilated. Left ventricular diastolic parameters are indeterminate. 2. Right ventricular systolic function is mildly reduced. The right ventricular size is mildly enlarged. 3. Left atrial size was severely dilated. 4. Right atrial size was severely dilated. 5. A small pericardial effusion is present. The pericardial effusion is posterior to the left ventricle. 6. The mitral valve is abnormal. Mild mitral valve regurgitation. No evidence of mitral stenosis. 7.  Tricuspid valve regurgitation is moderate. 8. The aortic valve is tricuspid. There is mild calcification of the aortic valve. There is mild thickening of the aortic valve. Aortic valve regurgitation is trivial. Aortic valve sclerosis is present, with no evidence of aortic valve stenosis. 9. The inferior vena cava is normal in size with greater than 50% respiratory variability, suggesting right atrial pressure of 3 mmHg.  FINDINGS Left Ventricle: Left ventricular ejection fraction, by estimation, is 30 to 35%. The left ventricle has moderately decreased function. The left ventricle demonstrates global hypokinesis. Definity  contrast agent was given IV to delineate the left ventricular endocardial borders. Strain was performed and the global longitudinal strain is indeterminate. The left ventricular internal cavity size was moderately dilated. There is no left ventricular hypertrophy. Left ventricular diastolic parameters are indeterminate.  Right Ventricle: The right ventricular size is mildly enlarged. No increase in right ventricular wall thickness. Right ventricular systolic function is mildly reduced.  Left Atrium: Left atrial size was severely dilated.  Right Atrium: Right atrial size was severely dilated.  Pericardium: A small pericardial effusion is present. The pericardial effusion is posterior to the left ventricle.  Mitral Valve: The mitral valve is abnormal. There is mild thickening of the mitral valve leaflet(s). Mild mitral valve regurgitation. No evidence of mitral valve stenosis.  Tricuspid Valve: The tricuspid valve is normal in structure. Tricuspid valve regurgitation is moderate . No evidence of tricuspid stenosis.  Aortic Valve: The aortic valve is tricuspid. There is mild calcification of the aortic valve. There is mild thickening of the aortic valve. Aortic valve regurgitation is trivial. Aortic valve sclerosis is present, with no evidence of aortic  valve stenosis.  Pulmonic Valve: The pulmonic valve was normal in structure. Pulmonic valve regurgitation is mild. No evidence of pulmonic stenosis.  Aorta: The aortic root is normal in size and structure.  Venous: The inferior vena cava is normal in size with greater than 50% respiratory variability, suggesting right atrial pressure of 3 mmHg.  IAS/Shunts: No atrial level shunt detected by color flow Doppler.  Additional Comments: 3D was performed not requiring image post processing on an independent workstation and was indeterminate.   LEFT VENTRICLE PLAX 2D LVIDd:         4.70 cm   Diastology LVIDs:         3.80 cm   LV e' medial:    6.96 cm/s LV PW:         1.10 cm   LV E/e' medial:  12.3 LV IVS:        1.00 cm   LV e' lateral:   4.57 cm/s LVOT diam:     2.00 cm   LV E/e'  lateral: 18.8 LV SV:         32 LV SV Index:   20 LVOT Area:     3.14 cm   RIGHT VENTRICLE RV S prime:     15.30 cm/s TAPSE (M-mode): 1.0 cm  LEFT ATRIUM             Index        RIGHT ATRIUM           Index LA diam:        4.50 cm 2.88 cm/m   RA Area:     22.10 cm LA Vol (A2C):   59.6 ml 38.08 ml/m  RA Volume:   62.50 ml  39.94 ml/m LA Vol (A4C):   75.0 ml 47.92 ml/m LA Biplane Vol: 68.9 ml 44.02 ml/m AORTIC VALVE LVOT Vmax:   57.70 cm/s LVOT Vmean:  39.800 cm/s LVOT VTI:    0.102 m  AORTA Ao Root diam: 3.20 cm Ao Asc diam:  3.50 cm  MITRAL VALVE MV Area (PHT): 5.75 cm    SHUNTS MV Decel Time: 132 msec    Systemic VTI:  0.10 m MV E velocity: 85.70 cm/s  Systemic Diam: 2.00 cm MV A velocity: 25.20 cm/s MV E/A ratio:  3.40  Maude Emmer MD Electronically signed by Maude Emmer MD Signature Date/Time: 11/22/2023/12:08:35 PM    Final          ______________________________________________________________________________________________       Current Reported Medications:.    Current Meds  Medication Sig   acetaminophen  (TYLENOL ) 500 MG tablet Take 500 mg by mouth every 6  (six) hours as needed for mild pain.   apixaban  (ELIQUIS ) 5 MG TABS tablet Take 1 tablet (5 mg total) by mouth 2 (two) times daily.   Cholecalciferol (VITAMIN D3) 25 MCG (1000 UT) CAPS Take 1 capsule by mouth at bedtime.   citalopram  (CELEXA ) 20 MG tablet Take 20 mg by mouth at bedtime.   Lidocaine  HCl (ASPERCREME LIDOCAINE ) 4 % CREA 1 Application.   metoprolol  succinate (TOPROL  XL) 25 MG 24 hr tablet Take 1 tablet (25 mg total) by mouth daily. Take with Metoprolol  Succinate ER 50 mg to get to 75 mg.   metoprolol  succinate (TOPROL -XL) 50 MG 24 hr tablet Take 1 tablet (50 mg total) by mouth daily. Take with Metoprolol  Succinate ER 25 mg to get to 75 mg.   trolamine salicylate (ASPERCREME) 10 % cream Apply 1 Application topically as needed for muscle pain (Back).   [DISCONTINUED] losartan  (COZAAR ) 25 MG tablet Take 0.5 tablets (12.5 mg total) by mouth daily.   [DISCONTINUED] torsemide  (DEMADEX ) 20 MG tablet Take 1 tablet (20 mg total) by mouth daily.    Physical Exam:    VS:  BP 126/72   Pulse 86   Ht 4' 11 (1.499 m)   Wt 139 lb (63 kg)   SpO2 98%   BMI 28.07 kg/m    Wt Readings from Last 3 Encounters:  01/08/24 139 lb (63 kg)  12/15/23 145 lb 6.4 oz (66 kg)  11/28/23 136 lb 11 oz (62 kg)    GEN: Well nourished, well developed in no acute distress NECK: No JVD; No carotid bruits CARDIAC: Irregular rate and rhythm, no murmurs, rubs, gallops RESPIRATORY:  Crackles in bilateral lung bases, normal WOB   ABDOMEN: Soft, non-tender, non-distended EXTREMITIES:  +2 edema BLE; No acute deformity     Asessement and Plan:.    Chronic systolic HF: Patient admitted in 11/2023 with shortness of  breath, orthopnea.  Found to be in A-fib with RVR and hypervolemic.  Echo on 6-8 showed EF 30 to 35%, mildly reduced RV systolic function, severe biatrial enlargement, mild MR, moderate TR.  Felt to be tacky mediated cardiomyopathy.  Patient is on torsemide  as needed, at last office visit instructed to  patient to take torsemide  20 mg for the next 3 days then to return to as needed.  Patient's digoxin  previously discontinued as she felt no improvement.  Today patient continues to endorse increased dyspnea on exertion, lower extremity edema and orthopnea.  She reports that she is having to prop up on 1-1-1/2 pillows at night, and increased from her baseline per patient.  Patient's weights have decreased however continues to note symptoms and now endorses increased lower extremity edema.  Compression stockings and leg elevation recommended.  Will also increase patient's torsemide  to 40 mg daily for the next 3 to 5 days.  Will also check BMET and BNP today.  Spironolactone  previously discontinued due to hyperkalemia. Continue losartan  12.5 mg daily, metoprolol  succinate.  Permanent atrial fibrillation: Patient first diagnosed in 2018, failed multiple cardioversions and AAD's.  Has been managed with rate control strategy since 2019.  Patient previously on Dilt however this was discontinued due to reduced EF on echo in 11/2023.  She was discharged metoprolol  succinate 50 mg daily, digoxin  0.0625 mg daily.  At last office visit patient reported significant palpitations and increased heart rates, metoprolol  was increased to 75 mg daily.  Patient's heart rates overall well-controlled, averaging between 70 to 80 bpm, consistently less than 90 bpm.  Patient denies any bleeding problems on Eliquis .  Continue Eliquis  5 mg twice daily and metoprolol  succinate 75 mg daily.  AAA: CTA abdomen/pelvis from 10/2022 showed an unchanged saccular aneurysm of the anterior infrarenal abdominal aorta with a large burden of mural thrombus.  Patient is not a surgical candidate due to age and preference.  Hypertension: Blood pressure today 126/72, blood pressure at home overall well-controlled. Continue losartan  12.5 mg daily.   Disposition: F/u with Dr. [name] in 01/2024 as planned.   Signed, Marian Grandt D Jaxxen Voong, NP

## 2024-01-08 ENCOUNTER — Ambulatory Visit: Attending: Cardiology | Admitting: Cardiology

## 2024-01-08 ENCOUNTER — Encounter: Payer: Self-pay | Admitting: Cardiology

## 2024-01-08 VITALS — BP 126/72 | HR 86 | Ht 59.0 in | Wt 139.0 lb

## 2024-01-08 DIAGNOSIS — I7143 Infrarenal abdominal aortic aneurysm, without rupture: Secondary | ICD-10-CM

## 2024-01-08 DIAGNOSIS — I5022 Chronic systolic (congestive) heart failure: Secondary | ICD-10-CM | POA: Diagnosis not present

## 2024-01-08 DIAGNOSIS — I10 Essential (primary) hypertension: Secondary | ICD-10-CM

## 2024-01-08 DIAGNOSIS — I4821 Permanent atrial fibrillation: Secondary | ICD-10-CM

## 2024-01-08 MED ORDER — LOSARTAN POTASSIUM 25 MG PO TABS: 12.5000 mg | ORAL_TABLET | Freq: Every day | ORAL | 3 refills | Status: AC

## 2024-01-08 MED ORDER — TORSEMIDE 20 MG PO TABS: 20.0000 mg | ORAL_TABLET | Freq: Every day | ORAL | 3 refills | Status: DC

## 2024-01-08 NOTE — Patient Instructions (Signed)
 Medication Instructions:  INCREASE TORSEMIDE  TO 40 MG FOR THE NEXT 3-5 DAYS AND RESUME BACK TO NORMAL DOSAGE *If you need a refill on your cardiac medications before your next appointment, please call your pharmacy*  Lab Work: BMP AND BNP TODAY If you have labs (blood work) drawn today and your tests are completely normal, you will receive your results only by: MyChart Message (if you have MyChart) OR A paper copy in the mail If you have any lab test that is abnormal or we need to change your treatment, we will call you to review the results.  Testing/Procedures: NO TESTING  Follow-Up: At [facility], you and your health needs are our priority.  As part of our continuing mission to provide you with exceptional heart care, our providers are all part of one team.  This team includes your primary Cardiologist (physician) and Advanced Practice Providers or APPs (Physician Assistants and Nurse Practitioners) who all work together to provide you with the care you need, when you need it.  Your next appointment:   KEEP FOLLOW UP February 03, 2024  Provider:   Darryle ONEIDA Decent, MD

## 2024-01-09 LAB — BASIC METABOLIC PANEL WITH GFR
BUN/Creatinine Ratio: 22 (ref 12–28)
BUN: 17 mg/dL (ref 8–27)
CO2: 24 mmol/L (ref 20–29)
Calcium: 9.9 mg/dL (ref 8.7–10.3)
Chloride: 96 mmol/L (ref 96–106)
Creatinine, Ser: 0.78 mg/dL (ref 0.57–1.00)
Glucose: 86 mg/dL (ref 70–99)
Potassium: 4.8 mmol/L (ref 3.5–5.2)
Sodium: 137 mmol/L (ref 134–144)
eGFR: 73 mL/min/1.73 (ref >59)

## 2024-01-09 LAB — BRAIN NATRIURETIC PEPTIDE: BNP: 540.2 pg/mL — ABNORMAL HIGH (ref 0.0–100.0)

## 2024-01-10 ENCOUNTER — Ambulatory Visit: Payer: Self-pay | Admitting: Cardiology

## 2024-01-12 DIAGNOSIS — Z556 Problems related to health literacy: Secondary | ICD-10-CM | POA: Diagnosis not present

## 2024-01-12 DIAGNOSIS — I5023 Acute on chronic systolic (congestive) heart failure: Secondary | ICD-10-CM | POA: Diagnosis not present

## 2024-01-12 DIAGNOSIS — I110 Hypertensive heart disease with heart failure: Secondary | ICD-10-CM | POA: Diagnosis not present

## 2024-01-12 DIAGNOSIS — E876 Hypokalemia: Secondary | ICD-10-CM | POA: Diagnosis not present

## 2024-01-12 DIAGNOSIS — I4821 Permanent atrial fibrillation: Secondary | ICD-10-CM | POA: Diagnosis not present

## 2024-01-12 NOTE — Telephone Encounter (Signed)
-----   Message from Katlyn D West sent at 01/10/2024  9:09 AM EDT ----- Please call and let Ms. Diana know that her kidney function and electrolytes are normal. Her BNP indicates she is holding extra fluid, recommend she continue Torsemide  40 mg daily for the next five  days. She will need a recheck BMET in 10-14 days. Thanks!  ----- Message ----- From: Rebecka Memos Lab Results In Sent: 01/09/2024   6:36 AM EDT To: Katlyn D West, NP

## 2024-01-12 NOTE — Telephone Encounter (Signed)
 Called patient advised of below they verbalized understanding.

## 2024-01-14 ENCOUNTER — Telehealth: Payer: Self-pay

## 2024-01-14 MED ORDER — METOPROLOL SUCCINATE ER 100 MG PO TB24: 100.0000 mg | ORAL_TABLET | Freq: Every day | ORAL | 3 refills | Status: DC

## 2024-01-14 NOTE — Telephone Encounter (Signed)
-----   Message from Katlyn D [place] sent at 01/14/2024  5:08 PM EDT ----- Regarding: RE: Thumping Heart Recommend when she resumes Torsemide  20 mg daily we increase her metoprolol  succinate to 100 mg daily. She should continue to monitor her blood pressure and heart rate after taking her medications.   Katlyn West, NP ----- Message ----- From: Byron Leontine RAMAN, CMA Sent: 01/12/2024   6:15 PM EDT To: Katlyn D West, NP Subject: Thumping Heart                                 Patient reports that they are having a thumping feeling in there chest. Patient reports no racing heart, worsening fatigue, dizziness or lightheaded. Patient blood pressure has been well controlled in the ranges of 110 - 120s Patient diastolic number before checking BP has been in the 117's  Please advise?

## 2024-01-14 NOTE — Telephone Encounter (Signed)
 Called patient advised of below they verbalized understanding.

## 2024-01-19 DIAGNOSIS — Z556 Problems related to health literacy: Secondary | ICD-10-CM | POA: Diagnosis not present

## 2024-01-19 DIAGNOSIS — I5023 Acute on chronic systolic (congestive) heart failure: Secondary | ICD-10-CM | POA: Diagnosis not present

## 2024-01-19 DIAGNOSIS — E876 Hypokalemia: Secondary | ICD-10-CM | POA: Diagnosis not present

## 2024-01-19 DIAGNOSIS — I110 Hypertensive heart disease with heart failure: Secondary | ICD-10-CM | POA: Diagnosis not present

## 2024-01-25 ENCOUNTER — Other Ambulatory Visit: Payer: Self-pay | Admitting: Cardiovascular Disease

## 2024-01-31 NOTE — Progress Notes (Unsigned)
 Cardiology Office Note:  .   Date:  02/03/2024  ID:  Whitney Burton, DOB [birth year], MRN [id number] PCP: Sun, Vyvyan, MD   [facility] Cardiologist:  Darryle ONEIDA Decent, MD    History of Present Illness: .    Chief Complaint  Patient presents with   Chronic diastolic heart failure    Atrial Fibrillation   Follow-up    Whitney Burton is a 89 y.o. female with history of CHF, perm Afib, AAA, HTN, HLD who presents for follow-up.    History of Present Illness   Whitney Burton is an 89 year old female with permanent atrial fibrillation and systolic heart failure who presents for follow-up after recent hospitalization for systolic dysfunction.  She was recently hospitalized for systolic dysfunction, during which her ejection fraction was found to be reduced to 30-35% from normal levels. She experiences shortness of breath with minimal exertion, such as walking to the clinic. Her weight has been somewhat stable, though she had to lose some weight recently. Blood pressure readings at home have been in the 120s to 130s.  She experiences swelling, particularly in her left leg and foot, which was severe one night. She feels her heart beating periodically, which she finds stressful, though she does not describe it as racing. She has had multiple failed DCCVs in the past.   Her current medications include metoprolol , losartan , torsemide , and Eliquis . She mentions having taken losartan  in the past and is currently on a regimen of torsemide  20 mg daily, though she has taken it twice daily for three days. She is also on Celexa , which she feels is not helping with her stress.  She lives independently and recently completed physical therapy after her hospital discharge. She uses a three-wheeler for mobility, which she finds easier to manage than other aids. She resides in an institution that provides her meals.          Problem List 1. Permanent Atrial Fibrillation  -diagnosed 2019 [facility]  [place] [facility] -failed DCCV and did not want to do rhythm medications  -CHADSVASC= 4 (age, sex, HTN) 2. Hypertension 3. AAA -31 mm 07/31/2022 -penetrating aortic ulcer? 4. Systolic HF -EF 30-35% 11/22/2023    ROS: All other ROS reviewed and negative. Pertinent positives noted in the HPI.     Studies Reviewed: Whitney Burton       TTE 11/22/2023  1. Left ventricular ejection fraction, by estimation, is 30 to 35%. The  left ventricle has moderately decreased function. The left ventricle  demonstrates global hypokinesis. The left ventricular internal cavity size  was moderately dilated. Left  ventricular diastolic parameters are indeterminate.   2. Right ventricular systolic function is mildly reduced. The right  ventricular size is mildly enlarged.   3. Left atrial size was severely dilated.   4. Right atrial size was severely dilated.   5. A small pericardial effusion is present. The pericardial effusion is  posterior to the left ventricle.   6. The mitral valve is abnormal. Mild mitral valve regurgitation. No  evidence of mitral stenosis.   7. Tricuspid valve regurgitation is moderate.   8. The aortic valve is tricuspid. There is mild calcification of the  aortic valve. There is mild thickening of the aortic valve. Aortic valve  regurgitation is trivial. Aortic valve sclerosis is present, with no  evidence of aortic valve stenosis.   9. The inferior vena cava is normal in size with greater than 50%  respiratory variability, suggesting right atrial pressure of 3 mmHg.  Physical Exam:   VS:  BP (!) 158/88 (BP Location: Right Arm, Patient Position: Sitting, Cuff Size: Normal)   Pulse (!) 54   Resp 16   Ht 4' 11 (1.499 m)   Wt 143 lb (64.9 kg)   SpO2 96%   BMI 28.88 kg/m    Wt Readings from Last 3 Encounters:  02/03/24 143 lb (64.9 kg)  01/08/24 139 lb (63 kg)  12/15/23 145 lb 6.4 oz (66 kg)    GEN: Well nourished, well developed in no acute distress NECK: No JVD; No carotid bruits CARDIAC:  irregular rhythm, no murmurs, rubs, gallops RESPIRATORY:  Clear to auscultation without rales, wheezing or rhonchi  ABDOMEN: Soft, non-tender, non-distended EXTREMITIES:  No edema; No deformity  ASSESSMENT AND PLAN: .   Assessment and Plan    Systolic heart failure with reduced ejection fraction EF decreased to 30-35% with dyspnea and intermittent edema. Fluid levels stable. She prefers non-invasive management. - Order stress test for coronary artery disease evaluation. - Add Jardiance  10 mg daily. - Check labs today, including potassium. K elevated and above safe level for aldactone  in the past.  - Increase metoprolol  succinate 100 mg QAM and 50 mg QPM. - Continue torsemide  20 mg daily. - Encourage fluid monitoring and salt restriction.  Permanent atrial fibrillation Intermittent palpitations post-discharge. Discussed medication management for symptom control and complication prevention. - Increase metoprolol  succinate to 100 mg AM and 50 mg PM. - Continue Eliquis  5 mg BID.  Hypertension Well-controlled at home. Office reading of 158/88 considered an outlier. Losartan  supports heart function. - Continue losartan  as prescribed.          Informed Consent   Shared Decision Making/Informed Consent The risks [chest pain, shortness of breath, cardiac arrhythmias, dizziness, blood pressure fluctuations, myocardial infarction, stroke/transient ischemic attack, nausea, vomiting, allergic reaction, radiation exposure, metallic taste sensation and life-threatening complications (estimated to be 1 in 10,000)], benefits (risk stratification, diagnosing coronary artery disease, treatment guidance) and alternatives of a nuclear stress test were discussed in detail with Whitney Burton and she agrees to proceed.      Follow-up: Return in about 3 months (around 05/05/2024).  Signed, Darryle DASEN. [name], MD, [facility]  [facility]  [address] [place], [place] [address] [phone]  7:49  PM

## 2024-02-03 ENCOUNTER — Telehealth: Payer: Self-pay

## 2024-02-03 ENCOUNTER — Encounter: Payer: Self-pay | Admitting: Cardiovascular Disease

## 2024-02-03 ENCOUNTER — Ambulatory Visit: Attending: Cardiology | Admitting: Cardiovascular Disease

## 2024-02-03 VITALS — BP 158/88 | HR 54 | Resp 16 | Ht 59.0 in | Wt 143.0 lb

## 2024-02-03 DIAGNOSIS — R0602 Shortness of breath: Secondary | ICD-10-CM | POA: Diagnosis not present

## 2024-02-03 DIAGNOSIS — I5022 Chronic systolic (congestive) heart failure: Secondary | ICD-10-CM

## 2024-02-03 DIAGNOSIS — I10 Essential (primary) hypertension: Secondary | ICD-10-CM

## 2024-02-03 DIAGNOSIS — I4821 Permanent atrial fibrillation: Secondary | ICD-10-CM

## 2024-02-03 DIAGNOSIS — I7143 Infrarenal abdominal aortic aneurysm, without rupture: Secondary | ICD-10-CM | POA: Diagnosis not present

## 2024-02-03 MED ORDER — METOPROLOL SUCCINATE ER 100 MG PO TB24: ORAL_TABLET | ORAL | 3 refills | Status: DC

## 2024-02-03 MED ORDER — EMPAGLIFLOZIN 10 MG PO TABS
10.0000 mg | ORAL_TABLET | Freq: Every day | ORAL | 0 refills | Status: DC
Start: 2024-02-03 — End: 2024-02-03

## 2024-02-03 MED ORDER — EMPAGLIFLOZIN 10 MG PO TABS: 10.0000 mg | ORAL_TABLET | Freq: Every day | ORAL | 3 refills | Status: DC

## 2024-02-03 MED ORDER — EMPAGLIFLOZIN 10 MG PO TABS: 10.0000 mg | ORAL_TABLET | Freq: Every day | ORAL | Status: AC

## 2024-02-03 MED ORDER — EMPAGLIFLOZIN 10 MG PO TABS: 10.0000 mg | ORAL_TABLET | Freq: Every day | ORAL | 0 refills | Status: DC

## 2024-02-03 NOTE — Telephone Encounter (Signed)
 Medication name/dosage: Samples List: Jardiance  10 mg  Administration instructions:  Take 10mg  tablet by mouth once daily.  Reason for samples: Reason for samples: new start  Ordering provider: Dr. [name]  *Once above information entered, route the phone encounter to [facility] SAMPLES and send Teams message to team member assigned to Samples for the day.

## 2024-02-03 NOTE — Patient Instructions (Addendum)
 Medication Instructions:  Your physician has recommended you make the following change in your medication:   -Increase metoprolol  succinate (Toprol -XL) to 100mg  in the morning and 50mg  (1/2 tablet) with dinner.  -Start Empagliflozin  (Jardiance ) 10mg  once daily.  *If you need a refill on your cardiac medications before your next appointment, please call your pharmacy*  Lab Work: Your physician recommends that you have labs drawn today: BMET & BNP  If you have labs (blood work) drawn today and your tests are completely normal, you will receive your results only by: MyChart Message (if you have MyChart) OR A paper copy in the mail If you have any lab test that is abnormal or we need to change your treatment, we will call you to review the results.  Testing/Procedures: Dr. [name] has ordered a Lexiscan Myocardial Perfusion Imaging Study.  Please arrive 15 minutes prior to your appointment time for registration and insurance purposes.   The test will take approximately 3 to 4 hours to complete; you may bring reading material.  If someone comes with you to your appointment, they will need to remain in the main lobby due to limited space in the testing area. **If you are pregnant or breastfeeding, please notify the nuclear lab prior to your appointment**   How to prepare for your Myocardial Perfusion Test: Do not eat or drink 3 hours prior to your test, except you may have water. Do not consume products containing caffeine (regular or decaffeinated) 12 hours prior to your test. (ex: coffee, chocolate, sodas, tea). Do wear comfortable clothes (no dresses or overalls) and walking shoes, tennis shoes preferred (No heels or open toe shoes are allowed). Do NOT wear cologne, perfume, aftershave, or lotions (deodorant is allowed). If you use an inhaler, use it the AM of your test and bring it with you.  If you use a nebulizer, use it the AM of your test.  If these instructions are not followed, your  test will have to be rescheduled.   Follow-Up: At [facility], you and your health needs are our priority.  As part of our continuing mission to provide you with exceptional heart care, our providers are all part of one team.  This team includes your primary Cardiologist (physician) and Advanced Practice Providers or APPs (Physician Assistants and Nurse Practitioners) who all work together to provide you with the care you need, when you need it.  Your next appointment:   3 month(s)  Provider:   Darryle ONEIDA [name], MD    We recommend signing up for the patient portal called MyChart.  Sign up information is provided on this After Visit Summary.  MyChart is used to connect with patients for Virtual Visits (Telemedicine).  Patients are able to view lab/test results, encounter notes, upcoming appointments, etc.  Non-urgent messages can be sent to your provider as well.   To learn more about what you can do with MyChart, go to [email or web address].

## 2024-02-04 LAB — BASIC METABOLIC PANEL WITH GFR
BUN/Creatinine Ratio: 31 — ABNORMAL HIGH (ref 12–28)
BUN: 22 mg/dL (ref 8–27)
CO2: 25 mmol/L (ref 20–29)
Calcium: 10.1 mg/dL (ref 8.7–10.3)
Chloride: 95 mmol/L — ABNORMAL LOW (ref 96–106)
Creatinine, Ser: 0.71 mg/dL (ref 0.57–1.00)
Glucose: 85 mg/dL (ref 70–99)
Potassium: 4.8 mmol/L (ref 3.5–5.2)
Sodium: 136 mmol/L (ref 134–144)
eGFR: 82 mL/min/1.73 (ref >59)

## 2024-02-04 LAB — BRAIN NATRIURETIC PEPTIDE: BNP: 269.5 pg/mL — ABNORMAL HIGH (ref 0.0–100.0)

## 2024-02-07 ENCOUNTER — Ambulatory Visit: Payer: Self-pay | Admitting: Cardiovascular Disease

## 2024-02-08 ENCOUNTER — Other Ambulatory Visit (HOSPITAL_COMMUNITY): Payer: Self-pay

## 2024-02-08 NOTE — Telephone Encounter (Signed)
Sample was given to pt

## 2024-02-09 ENCOUNTER — Telehealth (HOSPITAL_COMMUNITY): Payer: Self-pay | Admitting: *Deleted

## 2024-02-09 ENCOUNTER — Encounter (HOSPITAL_COMMUNITY): Payer: Self-pay | Admitting: *Deleted

## 2024-02-09 DIAGNOSIS — L820 Inflamed seborrheic keratosis: Secondary | ICD-10-CM | POA: Diagnosis not present

## 2024-02-09 NOTE — Telephone Encounter (Signed)
 Letter sent with instructions for upcoming stress test via [employer]. Argentina Bees, RN

## 2024-02-16 ENCOUNTER — Other Ambulatory Visit: Payer: Self-pay | Admitting: Cardiovascular Disease

## 2024-02-16 DIAGNOSIS — I5022 Chronic systolic (congestive) heart failure: Secondary | ICD-10-CM

## 2024-02-17 ENCOUNTER — Ambulatory Visit (HOSPITAL_COMMUNITY)
Admission: RE | Admit: 2024-02-17 | Discharge: 2024-02-17 | Disposition: A | Discharge status: Home / Self Care | Source: Ambulatory Visit | Attending: Internal Medicine | Admitting: Internal Medicine

## 2024-02-17 DIAGNOSIS — I5022 Chronic systolic (congestive) heart failure: Secondary | ICD-10-CM | POA: Insufficient documentation

## 2024-02-17 LAB — MYOCARDIAL PERFUSION IMAGING
Base ST Depression (mm): 0 mm
LV dias vol: 87 mL (ref 46–106)
LV sys vol: 46 mL (ref 3.8–5.2)
Nuc Stress EF: 47 %
Peak HR: 160 {beats}/min
Rest HR: 87 {beats}/min
Rest Nuclear Isotope Dose: 10.8 mCi
SDS: 2
SRS: 7
SSS: 5
ST Depression (mm): 0 mm
Stress Nuclear Isotope Dose: 30.4 mCi
TID: 0.98

## 2024-02-17 MED ORDER — REGADENOSON 0.4 MG/5ML IV SOLN
0.4000 mg | Freq: Once | INTRAVENOUS | Status: AC
  Administered 2024-02-17: 0.4 mg via INTRAVENOUS

## 2024-02-17 MED ORDER — REGADENOSON 0.4 MG/5ML IV SOLN
INTRAVENOUS | Status: AC
  Filled 2024-02-17: qty 5

## 2024-02-17 MED ORDER — TECHNETIUM TC 99M TETROFOSMIN IV KIT
30.4000 | PACK | Freq: Once | INTRAVENOUS | Status: AC | PRN
  Administered 2024-02-17: 30.4 via INTRAVENOUS

## 2024-02-17 MED ORDER — TECHNETIUM TC 99M TETROFOSMIN IV KIT
10.8000 | PACK | Freq: Once | INTRAVENOUS | Status: AC | PRN
  Administered 2024-02-17: 10.8 via INTRAVENOUS

## 2024-02-29 ENCOUNTER — Telehealth: Payer: Self-pay | Admitting: Cardiovascular Disease

## 2024-02-29 NOTE — Telephone Encounter (Signed)
 Called patient back about message. Patient is limited when she can come to the office. First available that works with patient's schedule is Monday. Made patient an appointment on Monday with Olivia Pavy PA.

## 2024-02-29 NOTE — Telephone Encounter (Signed)
 Whitney Burton:   Your stress test shows possible evidence of a prior heart attack.  I looked at the test and I think this could be artifact.  Sometimes the test is not perfect.  The good news is is that we can treat this with medication.  I would not recommend procedures or anything else at this time.  I would recommend you continue the medications you are taking.  We can discuss further when I see you back in the office.  Please let me know if you have any questions or concerns.   Signed, Darryle DASEN. [name], MD, [facility]  [facility]  [address] [place], [place] [address] [phone]  7:29 PM  Gave the patient the information above.  Patient reports that she is just very tired and short of breath; gets minimal sleep. She cannot do much activity without getting winded/out of juice very quickly. She wants to know what else can be done to help with this?  Informed her that I would send her questions to Dr Whitney Burton and we would be back in touch with his recommendations.

## 2024-02-29 NOTE — Telephone Encounter (Signed)
 Patient called to follow-up on her stress test results.

## 2024-03-01 NOTE — Progress Notes (Signed)
 Cardiology Office Note:  .   Date:  03/07/2024  ID:  [name], DOB Oct 26, 1932, MRN [id number] PCP: Sun, Vyvyan, MD  [facility] Providers Cardiologist:  Darryle ONEIDA Decent, MD    History of Present Illness: .   Whitney Burton is a 89 y.o. female  with permanent atrial fibrillation and systolic heart failure ejection fraction was found to be reduced to 30-35%. She experiences shortness of breath with minimal exertion. She saw Dr. Decent 01/2024 and added Jardiance , increased metoprolol  100 mg am 50 mg pm and ordered NST-no ischemia.  Patient comes in for f/u. She has swelling and shortness of breath. Skips torsemide  2-3 times a week due to meetings which should end in Oct. Weights are staying the same. BP running low after her meds. HR stable on chart she brought in. She lives at [place] so gets all her meals there.   ROS:    Studies Reviewed: SABRA         Prior CV Studies:   NST 02/2024   Findings are consistent with infarction. The study is intermediate risk.   No ST deviation was noted.   LV perfusion is abnormal. There is no evidence of ischemia. There is evidence of infarction. Defect 1: There is a medium defect with moderate reduction in uptake present in the apical to basal anterolateral and lateral location(s) that is fixed. There is abnormal wall motion in the defect area. Consistent with infarction.   Left ventricular function is abnormal. Nuclear stress EF: 47%. The left ventricular ejection fraction is mildly decreased (45-54%). End diastolic cavity size is normal. End systolic cavity size is normal.    TTE 11/22/2023  1. Left ventricular ejection fraction, by estimation, is 30 to 35%. The  left ventricle has moderately decreased function. The left ventricle  demonstrates global hypokinesis. The left ventricular internal cavity size  was moderately dilated. Left  ventricular diastolic parameters are indeterminate.   2. Right ventricular systolic function is mildly reduced. The  right  ventricular size is mildly enlarged.   3. Left atrial size was severely dilated.   4. Right atrial size was severely dilated.   5. A small pericardial effusion is present. The pericardial effusion is  posterior to the left ventricle.   6. The mitral valve is abnormal. Mild mitral valve regurgitation. No  evidence of mitral stenosis.   7. Tricuspid valve regurgitation is moderate.   8. The aortic valve is tricuspid. There is mild calcification of the  aortic valve. There is mild thickening of the aortic valve. Aortic valve  regurgitation is trivial. Aortic valve sclerosis is present, with no  evidence of aortic valve stenosis.   9. The inferior vena cava is normal in size with greater than 50%  respiratory variability, suggesting right atrial pressure of 3 mmHg.   Risk Assessment/Calculations:    CHA2DS2-VASc Score = 6   This indicates a 9.7% annual risk of stroke. The patient's score is based upon: CHF History: 1 HTN History: 1 Diabetes History: 0 Stroke History: 0 Vascular Disease History: 1 Age Score: 2 Gender Score: 1            Physical Exam:   VS:  BP 115/78 (BP Location: Right Arm, Patient Position: Sitting, Cuff Size: Normal)   Pulse (!) 57   Ht 4' 11 (1.499 m)   Wt 143 lb (64.9 kg)   SpO2 97%   BMI 28.88 kg/m    Orhtostatics: No data found. Wt Readings from Last 3 Encounters:  03/07/24 143 lb (64.9 kg)  02/03/24 143 lb (64.9 kg)  01/08/24 139 lb (63 kg)    GEN: Well nourished, well developed in no acute distress NECK: increased JVD; No carotid bruits CARDIAC:  irreg irreg, no murmurs, rubs, gallops RESPIRATORY: bibasilar crackles ABDOMEN: Soft, non-tender, non-distended EXTREMITIES:  trace ankle edema; No deformity   ASSESSMENT AND PLAN: .    Systolic heart failure with reduced ejection fraction EF decreased to 30-35% with dyspnea and intermittent edema.  She prefers non-invasive management. -  stress test no ischemia, prior infarct -missing  torsemide  2-3 days/week. Discussed consistently. Torsemide  20 mg 2 today and tomorrow then once daily. Check bmet and bnp today.  - Jardiance  10 mg daily. -K has been high in the past limiting sprironolactone.  - Increase metoprolol  succinate 100 mg QAM and 50 mg QPM. - Encourage fluid monitoring and salt restriction.   Permanent atrial fibrillation -improved on Increased metoprolol  succinate to 100 mg AM and 50 mg PM. - Continue Eliquis  5 mg BID.   Hypertension Well-controlled  - Continue losartan  as prescribed.              Dispo: f/u in 3-4 months  Signed, Olivia Pavy, PA-C

## 2024-03-07 ENCOUNTER — Ambulatory Visit: Attending: Physician Assistant | Admitting: Physician Assistant

## 2024-03-07 ENCOUNTER — Encounter: Payer: Self-pay | Admitting: Physician Assistant

## 2024-03-07 VITALS — BP 115/78 | HR 57 | Ht 59.0 in | Wt 143.0 lb

## 2024-03-07 DIAGNOSIS — I5023 Acute on chronic systolic (congestive) heart failure: Secondary | ICD-10-CM

## 2024-03-07 DIAGNOSIS — I10 Essential (primary) hypertension: Secondary | ICD-10-CM | POA: Diagnosis not present

## 2024-03-07 DIAGNOSIS — I4821 Permanent atrial fibrillation: Secondary | ICD-10-CM

## 2024-03-07 NOTE — Patient Instructions (Signed)
 Medication Instructions:  Increase Torsemide  20 mg take two tablets today, then two tablets tomorrow, then resume taking one tablet daily.  *If you need a refill on your cardiac medications before your next appointment, please call your pharmacy*  Lab Work: TODAY- BMET, BNP If you have labs (blood work) drawn today and your tests are completely normal, you will receive your results only by: MyChart Message (if you have MyChart) OR A paper copy in the mail If you have any lab test that is abnormal or we need to change your treatment, we will call you to review the results.   Follow-Up: At [facility], you and your health needs are our priority.  As part of our continuing mission to provide you with exceptional heart care, our providers are all part of one team.  This team includes your primary Cardiologist (physician) and Advanced Practice Providers or APPs (Physician Assistants and Nurse Practitioners) who all work together to provide you with the care you need, when you need it.  Your next appointment:   3 month(s)  Provider:   Darryle ONEIDA Decent, MD

## 2024-03-08 ENCOUNTER — Ambulatory Visit: Payer: Self-pay | Admitting: Physician Assistant

## 2024-03-08 DIAGNOSIS — L820 Inflamed seborrheic keratosis: Secondary | ICD-10-CM | POA: Diagnosis not present

## 2024-03-08 LAB — BASIC METABOLIC PANEL WITH GFR
BUN/Creatinine Ratio: 19 (ref 12–28)
BUN: 15 mg/dL (ref 8–27)
CO2: 24 mmol/L (ref 20–29)
Calcium: 9.5 mg/dL (ref 8.7–10.3)
Chloride: 98 mmol/L (ref 96–106)
Creatinine, Ser: 0.8 mg/dL (ref 0.57–1.00)
Glucose: 88 mg/dL (ref 70–99)
Potassium: 4.7 mmol/L (ref 3.5–5.2)
Sodium: 136 mmol/L (ref 134–144)
eGFR: 70 mL/min/1.73 (ref >59)

## 2024-03-08 LAB — PRO B NATRIURETIC PEPTIDE: NT-Pro BNP: 4075 pg/mL — ABNORMAL HIGH (ref 0–738)

## 2024-04-11 ENCOUNTER — Other Ambulatory Visit: Payer: Self-pay | Admitting: Cardiovascular Disease

## 2024-04-12 ENCOUNTER — Other Ambulatory Visit: Payer: Self-pay | Admitting: Cardiology

## 2024-04-12 DIAGNOSIS — L820 Inflamed seborrheic keratosis: Secondary | ICD-10-CM | POA: Diagnosis not present

## 2024-04-13 ENCOUNTER — Telehealth: Payer: Self-pay | Admitting: Cardiovascular Disease

## 2024-04-13 MED ORDER — METOPROLOL SUCCINATE ER 100 MG PO TB24: 100.0000 mg | ORAL_TABLET | Freq: Every morning | ORAL | 3 refills | Status: AC

## 2024-04-13 MED ORDER — METOPROLOL SUCCINATE ER 50 MG PO TB24: 50.0000 mg | ORAL_TABLET | Freq: Every evening | ORAL | 3 refills | Status: AC

## 2024-04-13 NOTE — Telephone Encounter (Signed)
 Pt c/o Shortness Of Breath: STAT if SOB developed within the last 24 hours or pt is noticeably SOB on the phone  1. Are you currently SOB (can you hear that pt is SOB on the phone)? no  2. How long have you been experiencing SOB? A while  3. Are you SOB when sitting or when up moving around? both  4. Are you currently experiencing any other symptoms? Fatigue Pt is also confused about her medications

## 2024-04-13 NOTE — Telephone Encounter (Signed)
 Returned patient's phone call. Her main concern regards her current medications. She states she has always been SOB, and this is not her main concern. The SOB is very bad all the time, but she understands why (Hx CHF/on torsemide ).   Reviewed all Cardiology medications (indications and prescribed current doses) with pt. She verbalized understanding. Also, she was confused about the Metoprolol  dose, but does understand that she should take 100 mg in the am and 50 mg in the evenings. She requests that [facility] a [facility] regarding [facility] medication (pt unsure which med they were referring to when they called her).   [facility], but Pharmacist asked that we call after 2:00 pm as they are closed for lunch.   Pt has f/u with Dr. [name] on 05/02/2024. Labs last drawn on 03/08/2024.

## 2024-04-13 NOTE — Telephone Encounter (Signed)
 Called and spoke to [facility] Pharmacist to clarify that pt would like two separate RX's for the metoprolol  succinate medication (100 mg in am and 50 mg in pm). Updated in pt's chart to reflect this also, and RX's re sent.

## 2024-05-01 NOTE — Progress Notes (Unsigned)
 Cardiology Office Note:  .   Date:  05/02/2024  ID:  [name], DOB [birth year], MRN [id number] PCP: Sun, Vyvyan, MD  [facility] Providers Cardiologist:  Darryle ONEIDA Decent, MD {  History of Present Illness: .    Chief Complaint  Patient presents with   Chronic systolic heart failure     Whitney Burton is a 89 y.o. female with history of CHF who presents for follow-up.    History of Present Illness   Whitney Burton is an 89 year old female with systolic heart failure and permanent atrial fibrillation who presents for follow-up.  She experiences persistent shortness of breath, particularly with minimal activity, and describes feeling 'out of gas.' She uses a walker for mobility but needs to sit frequently due to breathlessness. She has not been participating in physical therapy recently, though she did in the past without perceiving much benefit.  Her weight is stable, and she is managing her salt intake well. She is currently taking torsemide  daily, which she believes helps with her breathing, although she does not feel much relief lately. She mentions a hacking cough and difficulty catching her breath, especially when lying flat, which affects her sleep. She sleeps on her left side but feels congested on that side. She is concerned about fluid retention but notes no significant swelling in her legs.  Her current medications include metoprolol  succinate 100 mg in the morning and 50 mg in the evening, losartan  12.5 mg daily, Jardiance  10 mg daily, and Eliquis . She manages her medications independently and resides in an independent living facility, [facility].  She mentions a recent weight loss of three pounds but notes that her weight had decreased more previously and is now increasing again. She has a history of systolic heart failure with an ejection fraction of 30-35% and no ischemia on stress testing.          Problem List 1. Permanent Atrial Fibrillation  -diagnosed 2019  [facility] -failed DCCV and did not want to do rhythm medications  -CHADSVASC= 4 (age, sex, HTN) 2. Hypertension 3. AAA -31 mm 07/31/2022 -penetrating aortic ulcer? 4. Systolic HF -EF 30-35% 11/22/2023    ROS: All other ROS reviewed and negative. Pertinent positives noted in the HPI.     Studies Reviewed: SABRA   EKG Interpretation Date/Time:  Monday May 02 2024 09:26:54 EST Ventricular Rate:  93 PR Interval:    QRS Duration:  92 QT Interval:  392 QTC Calculation: 487 R Axis:   -31  Text Interpretation: Atrial fibrillation with premature ventricular or aberrantly conducted complexes Left axis deviation Inferior infarct (cited on or before 15-Dec-2023) When compared with ECG of 15-Dec-2023 10:47, Confirmed by Decent Darryle [phone]) on 05/02/2024 9:32:37 AM   TTE 11/22/2023  1. Left ventricular ejection fraction, by estimation, is 30 to 35%. The  left ventricle has moderately decreased function. The left ventricle  demonstrates global hypokinesis. The left ventricular internal cavity size  was moderately dilated. Left  ventricular diastolic parameters are indeterminate.   2. Right ventricular systolic function is mildly reduced. The right  ventricular size is mildly enlarged.   3. Left atrial size was severely dilated.   4. Right atrial size was severely dilated.   5. A small pericardial effusion is present. The pericardial effusion is  posterior to the left ventricle.   6. The mitral valve is abnormal. Mild mitral valve regurgitation. No  evidence of mitral stenosis.   7. Tricuspid valve regurgitation is moderate.  8. The aortic valve is tricuspid. There is mild calcification of the  aortic valve. There is mild thickening of the aortic valve. Aortic valve  regurgitation is trivial. Aortic valve sclerosis is present, with no  evidence of aortic valve stenosis.   9. The inferior vena cava is normal in size with greater than 50%  respiratory variability, suggesting right  atrial pressure of 3 mmHg.   NM Stress 02/17/2024   Findings are consistent with infarction. The study is intermediate risk.   No ST deviation was noted.   LV perfusion is abnormal. There is no evidence of ischemia. There is evidence of infarction. Defect 1: There is a medium defect with moderate reduction in uptake present in the apical to basal anterolateral and lateral location(s) that is fixed. There is abnormal wall motion in the defect area. Consistent with infarction.   Left ventricular function is abnormal. Nuclear stress EF: 47%. The left ventricular ejection fraction is mildly decreased (45-54%). End diastolic cavity size is normal. End systolic cavity size is normal.  Physical Exam:   VS:  BP 125/69 (BP Location: Left Arm, Patient Position: Sitting, Cuff Size: Normal)   Pulse 88   Resp 16   Ht 4' 11 (1.499 m)   Wt 140 lb (63.5 kg)   SpO2 97%   BMI 28.28 kg/m    Wt Readings from Last 3 Encounters:  05/02/24 140 lb (63.5 kg)  03/07/24 143 lb (64.9 kg)  02/03/24 143 lb (64.9 kg)    GEN: Well nourished, well developed in no acute distress NECK: No JVD; No carotid bruits CARDIAC: irregular rhythm, no murmurs, rubs, gallops RESPIRATORY:  Clear to auscultation without rales, wheezing or rhonchi  ABDOMEN: Soft, non-tender, non-distended EXTREMITIES:  No edema; No deformity  ASSESSMENT AND PLAN: .   Assessment and Plan    Chronic systolic heart failure with reduced ejection fraction Chronic systolic heart failure with ejection fraction 30-35%. No ischemia on stress testing. Fluid levels well-managed. Persistent shortness of breath. BNP likely elevated due to heart failure and AFib. - Plan is for medical management due to advanced age and patient preference.  - Continue torsemide  20 mg daily. - Continue losartan  12.5 mg daily. - On metoprolol  as below.  - On jardiance  10 mg daily.  - Arranged physical therapy to improve breathing and energy levels. - Monitor for signs of fluid  retention, such as leg swelling.  Permanent atrial fibrillation Heart rates and blood pressure well-controlled on current regimen. - Continue metoprolol  succinate 100 mg in the morning and 50 mg in the evening. - Continue Eliquis  for anticoagulation.  Essential hypertension Well-controlled with current medication regimen. - Continue current antihypertensive regimen.   AAA - Concerns for penetrating aortic ulcer. She has opted for no procedures.    Physical Deconditioning  - Will arrange PT at her facility.              Follow-up: Return in about 4 months (around 08/30/2024).  Signed, Darryle DASEN. [name], MD, [facility]  [facility]  [address] [place], [place] [address] [phone]  9:54 AM

## 2024-05-02 ENCOUNTER — Ambulatory Visit: Attending: Cardiovascular Disease | Admitting: Cardiovascular Disease

## 2024-05-02 ENCOUNTER — Encounter: Payer: Self-pay | Admitting: Cardiovascular Disease

## 2024-05-02 VITALS — BP 125/69 | HR 88 | Resp 16 | Ht 59.0 in | Wt 140.0 lb

## 2024-05-02 DIAGNOSIS — I10 Essential (primary) hypertension: Secondary | ICD-10-CM | POA: Diagnosis not present

## 2024-05-02 DIAGNOSIS — R5381 Other malaise: Secondary | ICD-10-CM

## 2024-05-02 DIAGNOSIS — I5022 Chronic systolic (congestive) heart failure: Secondary | ICD-10-CM

## 2024-05-02 DIAGNOSIS — I4821 Permanent atrial fibrillation: Secondary | ICD-10-CM

## 2024-05-02 DIAGNOSIS — I7143 Infrarenal abdominal aortic aneurysm, without rupture: Secondary | ICD-10-CM

## 2024-05-02 NOTE — Patient Instructions (Signed)
 Medication Instructions:  No changes  *If you need a refill on your cardiac medications before your next appointment, please call your pharmacy*   Lab Work: Not needed    Testing/Procedures: Not needed   Follow-Up: At [facility], you and your health needs are our priority.  As part of our continuing mission to provide you with exceptional heart care, we have created designated Provider Care Teams.  These Care Teams include your primary Cardiologist (physician) and Advanced Practice Providers (APPs -  Physician Assistants and Nurse Practitioners) who all work together to provide you with the care you need, when you need it.     Your next appointment:   4 month(s)  The format for your next appointment:   In Person  Provider:   Darryle ONEIDA Decent, MD   Other Instructions   Order placed  for  Physical therapy   physical deconditioning

## 2024-08-29 ENCOUNTER — Ambulatory Visit: Admitting: Cardiovascular Disease
